# Patient Record
Sex: Female | Born: 1985 | Race: White | Hispanic: No | Marital: Single | Smoking: Never smoker
Health system: Southern US, Community
[De-identification: ages and names within clinical notes are randomized; demographics above are authoritative.]

## PROBLEM LIST (undated history)

## (undated) DIAGNOSIS — F419 Anxiety disorder, unspecified: Secondary | ICD-10-CM

## (undated) DIAGNOSIS — F32A Depression, unspecified: Secondary | ICD-10-CM

## (undated) DIAGNOSIS — G8929 Other chronic pain: Secondary | ICD-10-CM

## (undated) DIAGNOSIS — M797 Fibromyalgia: Secondary | ICD-10-CM

## (undated) DIAGNOSIS — R51 Headache: Secondary | ICD-10-CM

## (undated) DIAGNOSIS — F329 Major depressive disorder, single episode, unspecified: Secondary | ICD-10-CM

## (undated) DIAGNOSIS — Z8619 Personal history of other infectious and parasitic diseases: Secondary | ICD-10-CM

## (undated) DIAGNOSIS — R200 Anesthesia of skin: Secondary | ICD-10-CM

## (undated) DIAGNOSIS — G43909 Migraine, unspecified, not intractable, without status migrainosus: Secondary | ICD-10-CM

## (undated) DIAGNOSIS — R519 Headache, unspecified: Secondary | ICD-10-CM

## (undated) DIAGNOSIS — R202 Paresthesia of skin: Secondary | ICD-10-CM

## (undated) DIAGNOSIS — K603 Anal fistula: Secondary | ICD-10-CM

## (undated) DIAGNOSIS — K219 Gastro-esophageal reflux disease without esophagitis: Secondary | ICD-10-CM

## (undated) DIAGNOSIS — F411 Generalized anxiety disorder: Secondary | ICD-10-CM

## (undated) HISTORY — PX: TUBAL LIGATION: SHX77

## (undated) HISTORY — PX: FISTULOTOMY: SHX6413

## (undated) HISTORY — PX: INCISION AND DRAINAGE ABSCESS ANAL: SUR669

## (undated) HISTORY — DX: Fibromyalgia: M79.7

## (undated) HISTORY — DX: Headache, unspecified: R51.9

## (undated) HISTORY — DX: Morbid (severe) obesity due to excess calories: E66.01

## (undated) HISTORY — DX: Headache: R51

## (undated) HISTORY — DX: Other chronic pain: G89.29

---

## 2007-12-02 ENCOUNTER — Emergency Department: Payer: Self-pay | Admitting: Emergency Medicine

## 2008-07-18 ENCOUNTER — Emergency Department: Payer: Self-pay | Admitting: Emergency Medicine

## 2008-09-22 ENCOUNTER — Emergency Department: Payer: Self-pay | Admitting: Internal Medicine

## 2008-12-11 ENCOUNTER — Emergency Department: Payer: Self-pay | Admitting: Emergency Medicine

## 2009-10-12 ENCOUNTER — Ambulatory Visit: Payer: Self-pay

## 2011-07-18 ENCOUNTER — Emergency Department (HOSPITAL_COMMUNITY)
Admission: EM | Admit: 2011-07-18 | Discharge: 2011-07-18 | Disposition: A | Discharge status: Home / Self Care | Payer: PRIVATE HEALTH INSURANCE | Attending: Emergency Medicine | Admitting: Emergency Medicine

## 2011-07-18 ENCOUNTER — Encounter (HOSPITAL_COMMUNITY): Payer: Self-pay

## 2011-07-18 ENCOUNTER — Emergency Department (HOSPITAL_COMMUNITY): Payer: PRIVATE HEALTH INSURANCE

## 2011-07-18 DIAGNOSIS — R062 Wheezing: Secondary | ICD-10-CM | POA: Insufficient documentation

## 2011-07-18 DIAGNOSIS — R000 Tachycardia, unspecified: Secondary | ICD-10-CM | POA: Insufficient documentation

## 2011-07-18 DIAGNOSIS — R0602 Shortness of breath: Secondary | ICD-10-CM | POA: Insufficient documentation

## 2011-07-18 DIAGNOSIS — R079 Chest pain, unspecified: Secondary | ICD-10-CM | POA: Insufficient documentation

## 2011-07-18 DIAGNOSIS — J069 Acute upper respiratory infection, unspecified: Secondary | ICD-10-CM | POA: Insufficient documentation

## 2011-07-18 LAB — CBC
Hemoglobin: 13.8 g/dL (ref 12.0–15.0)
MCH: 29.5 pg (ref 26.0–34.0)
MCV: 83.8 fL (ref 78.0–100.0)
RBC: 4.68 MIL/uL (ref 3.87–5.11)
WBC: 9.2 10*3/uL (ref 4.0–10.5)

## 2011-07-18 LAB — POCT I-STAT, CHEM 8
BUN: 10 mg/dL (ref 6–23)
Creatinine, Ser: 0.6 mg/dL (ref 0.50–1.10)
Glucose, Bld: 114 mg/dL — ABNORMAL HIGH (ref 70–99)
Hemoglobin: 13.6 g/dL (ref 12.0–15.0)
Potassium: 4 mEq/L (ref 3.5–5.1)
Sodium: 141 mEq/L (ref 135–145)
TCO2: 22 mmol/L (ref 0–100)

## 2011-07-18 LAB — DIFFERENTIAL
Eosinophils Absolute: 0.6 10*3/uL (ref 0.0–0.7)
Eosinophils Relative: 6 % — ABNORMAL HIGH (ref 0–5)
Lymphocytes Relative: 31 % (ref 12–46)
Lymphs Abs: 2.9 10*3/uL (ref 0.7–4.0)
Monocytes Relative: 8 % (ref 3–12)

## 2011-07-18 MED ORDER — PREDNISONE 20 MG PO TABS
60.0000 mg | ORAL_TABLET | Freq: Once | ORAL | Status: AC
  Administered 2011-07-18: 60 mg via ORAL
  Filled 2011-07-18: qty 3

## 2011-07-18 MED ORDER — AEROCHAMBER Z-STAT PLUS/MEDIUM MISC
1.0000 | Freq: Once | Status: AC
  Administered 2011-07-18: 1

## 2011-07-18 MED ORDER — ALBUTEROL SULFATE HFA 108 (90 BASE) MCG/ACT IN AERS
2.0000 | INHALATION_SPRAY | Freq: Once | RESPIRATORY_TRACT | Status: AC
  Administered 2011-07-18: 2 via RESPIRATORY_TRACT
  Filled 2011-07-18 (×2): qty 6.7

## 2011-07-18 MED ORDER — PREDNISONE 20 MG PO TABS: 60.0000 mg | ORAL_TABLET | Freq: Every day | ORAL | Status: AC

## 2011-07-18 MED ORDER — ALBUTEROL (5 MG/ML) CONTINUOUS INHALATION SOLN
10.0000 mg/h | INHALATION_SOLUTION | Freq: Once | RESPIRATORY_TRACT | Status: AC
  Administered 2011-07-18: 10 mg/h via RESPIRATORY_TRACT
  Filled 2011-07-18 (×2): qty 20

## 2011-07-18 MED ORDER — GI COCKTAIL ~~LOC~~
30.0000 mL | Freq: Once | ORAL | Status: AC
  Administered 2011-07-18: 30 mL via ORAL
  Filled 2011-07-18: qty 30

## 2011-07-18 MED ORDER — ALBUTEROL SULFATE (5 MG/ML) 0.5% IN NEBU
5.0000 mg | INHALATION_SOLUTION | Freq: Once | RESPIRATORY_TRACT | Status: AC
  Administered 2011-07-18: 5 mg via RESPIRATORY_TRACT
  Filled 2011-07-18: qty 1

## 2011-07-18 NOTE — ED Notes (Signed)
Prescription given with discharge instructions.  

## 2011-07-18 NOTE — ED Notes (Signed)
Patient transported to X-ray 

## 2011-07-18 NOTE — ED Notes (Signed)
Pt complains of sob, onset last pm, hx of allergies but never have been this bad she sts.

## 2011-07-18 NOTE — ED Provider Notes (Signed)
History     CSN: [id number]  Arrival date & time 07/18/11  0501  6:17 AM Pt reports SOB that began about 11p, last night. Associated with burning in chest, wheezing, and coughing. Reports a h/o seasonal allergies. States yesterday she thought her allergies were acting up. Denies asthma, n/v, abdominal pain, fevers, sore throat, congestion. No OCP, smoking, no blood clots, no travel. Reports moving into a new house a few weeks ago and has noticed mild ankle pain/ swelling for last few weeks Patient is a 26 y.o. female presenting with shortness of breath. The history is provided by the patient.  Shortness of Breath  The current episode started yesterday. The onset was sudden. The problem occurs continuously. The problem has been unchanged. The problem is moderate. The symptoms are relieved by nothing. Associated symptoms include chest pain, cough, shortness of breath and wheezing. Pertinent negatives include no fever, no rhinorrhea and no sore throat. She was not exposed to toxic fumes. She has not inhaled smoke recently. She has had no prior steroid use. She has had no prior hospitalizations. She has had no prior ICU admissions. She has had no prior intubations. Her past medical history does not include asthma, past wheezing or eczema. She has been behaving normally.    History reviewed. No pertinent past medical history.  History reviewed. No pertinent past surgical history.  History reviewed. No pertinent family history.  History  Substance Use Topics  . Smoking status: Never Smoker   . Smokeless tobacco: Not on file  . Alcohol Use: No    OB History    Grav Para Term Preterm Abortions TAB SAB Ect Mult Living                  Review of Systems  Constitutional: Negative for fever, diaphoresis and fatigue.  HENT: Negative for congestion, sore throat, rhinorrhea and neck pain.   Eyes: Positive for itching.  Respiratory: Positive for cough, shortness of breath and wheezing.     Cardiovascular: Positive for chest pain. Negative for leg swelling.  Gastrointestinal: Negative for nausea, vomiting and abdominal pain.  Musculoskeletal: Negative for myalgias.  Neurological: Negative for dizziness, weakness, numbness and headaches.  All other systems reviewed and are negative.    Allergies  Review of patient's allergies indicates no known allergies.  Home Medications  No current outpatient prescriptions on file.  BP 118/69  Pulse 105  Temp(Src) 98.2 F (36.8 C) (Oral)  Resp 16  SpO2 97%  Physical Exam  Vitals reviewed. Constitutional: She is oriented to person, place, and time. Vital signs are normal. She appears well-developed and well-nourished. No distress.  HENT:  Head: Normocephalic and atraumatic.  Right Ear: External ear normal.  Left Ear: External ear normal.  Nose: Nose normal.  Mouth/Throat: Oropharynx is clear and moist. No oropharyngeal exudate.  Eyes: Conjunctivae are normal. Pupils are equal, round, and reactive to light.  Neck: Normal range of motion. Neck supple.  Cardiovascular: Regular rhythm and normal heart sounds.  Tachycardia present.  Exam reveals no friction rub.   No murmur heard. Pulmonary/Chest: Effort normal. She has wheezes (upper respiratory wheeze that clears with coughing). She has no rhonchi. She has no rales. She exhibits no tenderness.  Abdominal: Soft. Bowel sounds are normal. She exhibits no distension. There is no tenderness.  Musculoskeletal: Normal range of motion.  Lymphadenopathy:    She has no cervical adenopathy.  Neurological: She is alert and oriented to person, place, and time. Coordination normal.  Skin: Skin  is warm and dry. No rash noted. No erythema. No pallor.    ED Course  Procedures  Results for orders placed during the hospital encounter of 07/18/11  D-DIMER, QUANTITATIVE      Component Value Range   D-Dimer, Quant <0.22  0.00 - 0.48 (ug/mL-FEU)  CBC      Component Value Range   WBC 9.2  4.0  - 10.5 (K/uL)   RBC 4.68  3.87 - 5.11 (MIL/uL)   Hemoglobin 13.8  12.0 - 15.0 (g/dL)   HCT 16.1  09.6 - 04.5 (%)   MCV 83.8  78.0 - 100.0 (fL)   MCH 29.5  26.0 - 34.0 (pg)   MCHC 35.2  30.0 - 36.0 (g/dL)   RDW 40.9  81.1 - 91.4 (%)   Platelets 304  150 - 400 (K/uL)  DIFFERENTIAL      Component Value Range   Neutrophils Relative 54  43 - 77 (%)   Neutro Abs 5.0  1.7 - 7.7 (K/uL)   Lymphocytes Relative 31  12 - 46 (%)   Lymphs Abs 2.9  0.7 - 4.0 (K/uL)   Monocytes Relative 8  3 - 12 (%)   Monocytes Absolute 0.7  0.1 - 1.0 (K/uL)   Eosinophils Relative 6 (*) 0 - 5 (%)   Eosinophils Absolute 0.6  0.0 - 0.7 (K/uL)   Basophils Relative 0  0 - 1 (%)   Basophils Absolute 0.0  0.0 - 0.1 (K/uL)  POCT I-STAT, CHEM 8      Component Value Range   Sodium 141  135 - 145 (mEq/L)   Potassium 4.0  3.5 - 5.1 (mEq/L)   Chloride 108  96 - 112 (mEq/L)   BUN 10  6 - 23 (mg/dL)   Creatinine, Ser 7.82  0.50 - 1.10 (mg/dL)   Glucose, Bld 956 (*) 70 - 99 (mg/dL)   Calcium, Ion 2.13  0.86 - 1.32 (mmol/L)   TCO2 22  0 - 100 (mmol/L)   Hemoglobin 13.6  12.0 - 15.0 (g/dL)   HCT 57.8  46.9 - 62.9 (%)  POCT PREGNANCY, URINE      Component Value Range   Preg Test, Ur NEGATIVE  NEGATIVE    Dg Chest 2 View  07/18/2011  *RADIOLOGY REPORT*  Clinical Data: Shortness of breath, cough.  CHEST - 2 VIEW  Comparison: None.  Findings: Heart and mediastinal contours are within normal limits. No focal opacities or effusions.  No acute bony abnormality.  IMPRESSION: No active cardiopulmonary disease.  Original Report Authenticated By: Cyndie Chime, M.D.    MDM  9:42 AM  Patient reports feeling better. Suspect URi. Will d/c with albuterol and prednisone. Patient agrees with plan and is ready for d/c        Thomasene Lot, PA-C 07/18/11 5284

## 2011-07-18 NOTE — ED Notes (Signed)
Respiratory coming to administer treatment.

## 2011-07-18 NOTE — Discharge Instructions (Signed)
Please use inhaler 1-2 puffs every 4-6 hours as needed. Be sure to take all prednisone as prescribed.   Antibiotic Nonuse  Your caregiver felt that the infection or problem was not one that would be helped with an antibiotic. Infections may be caused by viruses or bacteria. Only a caregiver can tell which one of these is the likely cause of an illness. A cold is the most common cause of infection in both adults and children. A cold is a virus. Antibiotic treatment will have no effect on a viral infection. Viruses can lead to many lost days of work caring for sick children and many missed days of school. Children may catch as many as 10 "colds" or "flus" per year during which they can be tearful, cranky, and uncomfortable. The goal of treating a virus is aimed at keeping the ill person comfortable. Antibiotics are medications used to help the body fight bacterial infections. There are relatively few types of bacteria that cause infections but there are hundreds of viruses. While both viruses and bacteria cause infection they are very different types of germs. A viral infection will typically go away by itself within 7 to 10 days. Bacterial infections may spread or get worse without antibiotic treatment. Examples of bacterial infections are:  Sore throats (like strep throat or tonsillitis).   Infection in the lung (pneumonia).   Ear and skin infections.  Examples of viral infections are:  Colds or flus.   Most coughs and bronchitis.   Sore throats not caused by Strep.   Runny noses.  It is often best not to take an antibiotic when a viral infection is the cause of the problem. Antibiotics can kill off the helpful bacteria that we have inside our body and allow harmful bacteria to start growing. Antibiotics can cause side effects such as allergies, nausea, and diarrhea without helping to improve the symptoms of the viral infection. Additionally, repeated uses of antibiotics can cause bacteria  inside of our body to become resistant. That resistance can be passed onto harmful bacterial. The next time you have an infection it may be harder to treat if antibiotics are used when they are not needed. Not treating with antibiotics allows our own immune system to develop and take care of infections more efficiently. Also, antibiotics will work better for [place] when they are prescribed for bacterial infections. Treatments for a child that is ill may include:  Give extra fluids throughout the day to stay hydrated.   Get plenty of rest.   Only give your child over-the-counter or prescription medicines for pain, discomfort, or fever as directed by your caregiver.   The use of a cool mist humidifier may help stuffy noses.   Cold medications if suggested by your caregiver.  Your caregiver may decide to start you on an antibiotic if:  The problem you were seen for today continues for a longer length of time than expected.   You develop a secondary bacterial infection.  SEEK MEDICAL CARE IF:  Fever lasts longer than 5 days.   Symptoms continue to get worse after 5 to 7 days or become severe.   Difficulty in breathing develops.   Signs of dehydration develop (poor drinking, rare urinating, dark colored urine).   Changes in behavior or worsening tiredness (listlessness or lethargy).  Document Released: 05/29/2001 Document Revised: 03/09/2011 Document Reviewed: 11/25/2008 [facility] Patient Information 2012 [place], [place].  Upper Respiratory Infection, Adult An upper respiratory infection (URI) is also sometimes known as the common  cold. The upper respiratory tract includes the nose, sinuses, throat, trachea, and bronchi. Bronchi are the airways leading to the lungs. Most people improve within 1 week, but symptoms can last up to 2 weeks. A residual cough may last even longer.  CAUSES Many different viruses can infect the tissues lining the upper respiratory tract. The tissues become irritated  and inflamed and often become very moist. Mucus production is also common. A cold is contagious. You can easily spread the virus to others by oral contact. This includes kissing, sharing a glass, coughing, or sneezing. Touching your mouth or nose and then touching a surface, which is then touched by another person, can also spread the virus. SYMPTOMS  Symptoms typically develop 1 to 3 days after you come in contact with a cold virus. Symptoms vary from person to person. They may include:  Runny nose.   Sneezing.   Nasal congestion.   Sinus irritation.   Sore throat.   Loss of voice (laryngitis).   Cough.   Fatigue.   Muscle aches.   Loss of appetite.   Headache.   Low-grade fever.  DIAGNOSIS  You might diagnose your own cold based on familiar symptoms, since most people get a cold 2 to 3 times a year. Your caregiver can confirm this based on your exam. Most importantly, your caregiver can check that your symptoms are not due to another disease such as strep throat, sinusitis, pneumonia, asthma, or epiglottitis. Blood tests, throat tests, and X-rays are not necessary to diagnose a common cold, but they may sometimes be helpful in excluding other more serious diseases. Your caregiver will decide if any further tests are required. RISKS AND COMPLICATIONS  You may be at risk for a more severe case of the common cold if you smoke cigarettes, have chronic heart disease (such as heart failure) or lung disease (such as asthma), or if you have a weakened immune system. The very young and very old are also at risk for more serious infections. Bacterial sinusitis, middle ear infections, and bacterial pneumonia can complicate the common cold. The common cold can worsen asthma and chronic obstructive pulmonary disease (COPD). Sometimes, these complications can require emergency medical care and may be life-threatening. PREVENTION  The best way to protect against getting a cold is to practice good  hygiene. Avoid oral or hand contact with people with cold symptoms. Wash your hands often if contact occurs. There is no clear evidence that vitamin C, vitamin E, echinacea, or exercise reduces the chance of developing a cold. However, it is always recommended to get plenty of rest and practice good nutrition. TREATMENT  Treatment is directed at relieving symptoms. There is no cure. Antibiotics are not effective, because the infection is caused by a virus, not by bacteria. Treatment may include:  Increased fluid intake. Sports drinks offer valuable electrolytes, sugars, and fluids.   Breathing heated mist or steam (vaporizer or shower).   Eating chicken soup or other clear broths, and maintaining good nutrition.   Getting plenty of rest.   Using gargles or lozenges for comfort.   Controlling fevers with ibuprofen or acetaminophen as directed by your caregiver.   Increasing usage of your inhaler if you have asthma.  Zinc gel and zinc lozenges, taken in the first 24 hours of the common cold, can shorten the duration and lessen the severity of symptoms. Pain medicines may help with fever, muscle aches, and throat pain. A variety of non-prescription medicines are available to treat congestion and  runny nose. Your caregiver can make recommendations and may suggest nasal or lung inhalers for other symptoms.  HOME CARE INSTRUCTIONS   Only take over-the-counter or prescription medicines for pain, discomfort, or fever as directed by your caregiver.   Use a warm mist humidifier or inhale steam from a shower to increase air moisture. This may keep secretions moist and make it easier to breathe.   Drink enough water and fluids to keep your urine clear or pale yellow.   Rest as needed.   Return to work when your temperature has returned to normal or as your caregiver advises. You may need to stay home longer to avoid infecting others. You can also use a face mask and careful hand washing to prevent  spread of the virus.  SEEK MEDICAL CARE IF:   After the first few days, you feel you are getting worse rather than better.   You need your caregiver's advice about medicines to control symptoms.   You develop chills, worsening shortness of breath, or brown or red sputum. These may be signs of pneumonia.   You develop yellow or brown nasal discharge or pain in the face, especially when you bend forward. These may be signs of sinusitis.   You develop a fever, swollen neck glands, pain with swallowing, or white areas in the back of your throat. These may be signs of strep throat.  SEEK IMMEDIATE MEDICAL CARE IF:   You have a fever.   You develop severe or persistent headache, ear pain, sinus pain, or chest pain.   You develop wheezing, a prolonged cough, cough up blood, or have a change in your usual mucus (if you have chronic lung disease).   You develop sore muscles or a stiff neck.  Document Released: 09/13/2000 Document Revised: 03/09/2011 Document Reviewed: 07/22/2010 [facility] Patient Information 2012 [place], [place].

## 2011-07-18 NOTE — ED Notes (Signed)
First meeting with patient. Patient resting with NAD at this time. Family at bedside.

## 2011-07-19 NOTE — ED Provider Notes (Signed)
Medical screening examination/treatment/procedure(s) were performed by non-physician practitioner and as supervising physician I was immediately available for consultation/collaboration.  Olivia Mackie, MD 07/19/11 [phone]

## 2012-03-04 ENCOUNTER — Emergency Department (HOSPITAL_COMMUNITY): Payer: PRIVATE HEALTH INSURANCE

## 2012-03-04 ENCOUNTER — Emergency Department (HOSPITAL_COMMUNITY)
Admission: EM | Admit: 2012-03-04 | Discharge: 2012-03-04 | Disposition: A | Discharge status: Home / Self Care | Payer: PRIVATE HEALTH INSURANCE | Attending: Emergency Medicine | Admitting: Emergency Medicine

## 2012-03-04 ENCOUNTER — Encounter (HOSPITAL_COMMUNITY): Payer: Self-pay | Admitting: Emergency Medicine

## 2012-03-04 DIAGNOSIS — Y939 Activity, unspecified: Secondary | ICD-10-CM | POA: Insufficient documentation

## 2012-03-04 DIAGNOSIS — M25571 Pain in right ankle and joints of right foot: Secondary | ICD-10-CM

## 2012-03-04 DIAGNOSIS — IMO0002 Reserved for concepts with insufficient information to code with codable children: Secondary | ICD-10-CM | POA: Insufficient documentation

## 2012-03-04 DIAGNOSIS — Y929 Unspecified place or not applicable: Secondary | ICD-10-CM | POA: Insufficient documentation

## 2012-03-04 DIAGNOSIS — S8990XA Unspecified injury of unspecified lower leg, initial encounter: Secondary | ICD-10-CM | POA: Insufficient documentation

## 2012-03-04 DIAGNOSIS — M25579 Pain in unspecified ankle and joints of unspecified foot: Secondary | ICD-10-CM | POA: Insufficient documentation

## 2012-03-04 DIAGNOSIS — S99919A Unspecified injury of unspecified ankle, initial encounter: Secondary | ICD-10-CM

## 2012-03-04 MED ORDER — OXYCODONE-ACETAMINOPHEN 5-325 MG PO TABS: 1.0000 | ORAL_TABLET | ORAL | Status: DC | PRN

## 2012-03-04 MED ORDER — NAPROXEN 500 MG PO TABS: 500.0000 mg | ORAL_TABLET | Freq: Two times a day (BID) | ORAL | Status: DC

## 2012-03-04 MED ORDER — HYDROCODONE-ACETAMINOPHEN 5-325 MG PO TABS: 2.0000 | ORAL_TABLET | ORAL | Status: DC | PRN

## 2012-03-04 MED ORDER — OXYCODONE-ACETAMINOPHEN 5-325 MG PO TABS
2.0000 | ORAL_TABLET | Freq: Once | ORAL | Status: AC
  Administered 2012-03-04: 2 via ORAL
  Filled 2012-03-04: qty 2

## 2012-03-04 NOTE — ED Provider Notes (Signed)
Medical screening examination/treatment/procedure(s) were performed by non-physician practitioner and as supervising physician I was immediately available for consultation/collaboration  Taha Dimond, MD 03/04/12 1803 

## 2012-03-04 NOTE — ED Provider Notes (Signed)
[name] [name] is a 26 y.o. female with 4 mos of foot and ankle pain and associated limping.  Pt care assumed from [place], [place].  Pain radiated from foot into leg and on exam she is tender on the posterior tibial and bilateral malleoli.  Plan: if x-ray is negative, conservative treatment and ortho f/u.    Pt c/o pain, Percocet given.    X-ray with soft tissue swelling but without evidence of acute fracture or dislocation.  I discussed these findings with the patient, recommended conservative therapy including an ASO ankle brace and f/u with ortho for evaluation of the tendons and ligaments in the ankle.  I have also discussed reasons to return immediately to the ER.  Patient expresses understanding and agrees with plan.  1. Medications: norco, naprosyn, usual home medications 2. Treatment: rest, drink plenty of fluids, take medications as prescribed, RICE protocol 3. Follow Up: Please followup with your primary doctor for discussion of your diagnoses and further evaluation after today's visit; if you do not have a primary care doctor use the resource guide provided to find one; also f/u with orthopedics as listed   Dierdre Forth, PA-C 03/04/12 1725

## 2012-03-04 NOTE — ED Notes (Signed)
Pt presenting to ed with c/o right ankle pain x 4 months pt states she got her ankle caught in a tire and she continues to have pain. Pt states pain isn't getting any better. Pt states she is able to ambulate but she has pain.

## 2012-03-04 NOTE — ED Provider Notes (Signed)
History     CSN: [id number]  Arrival date & time 03/04/12  1300   First MD Initiated Contact with Patient 03/04/12 1403      Chief Complaint  Patient presents with  . Ankle Pain    (Consider location/radiation/quality/duration/timing/severity/associated sxs/prior treatment) HPI Comments: 26 year old female presents to the ED with chief complaint of right foot knee and hip pain.  Patient states that in August of this year she stepped in a tire and had what she thought was a ankle sprain.  She self treated with ankle brace and RICE.  She states that her symptoms got better after about 6 weeks, however she has had continued pain in the left ankle with limping and decreased in ability to perform her ADLs.  She states that over the past few days she's had increasing pain in her right foot and describes severe pain with weightbearing.  Pain is worst in her Achilles tendon and bilaterally posterior to the malleoli.  She also has pain across the dorsal surface of the foot.  She denies recent injury.  She has associated pain in the knee and hip but denies injury to the joints as well. SHE HAS INTERMITTENT SHOOTING PAIN FROM THE FOOT THAT RADIATES UP THE SHIN. Denies paresthesias   Denies fevers, chills, myalgias, arthralgias. Denies DOE, SOB, chest tightness or pressure, radiation to left arm, jaw or back, or diaphoresis. Denies dysuria, flank pain, suprapubic pain, frequency, urgency, or hematuria. Denies headaches, light headedness, weakness, visual disturbances. Denies abdominal pain, nausea, vomiting, diarrhea or constipation.    Patient is a 26 y.o. female presenting with ankle pain. The history is provided by the patient. No language interpreter was used.  Ankle Pain     History reviewed. No pertinent past medical history.  History reviewed. No pertinent past surgical history.  No family history on file.  History  Substance Use Topics  . Smoking status: Never Smoker   . Smokeless  tobacco: Not on file  . Alcohol Use: No    OB History    Grav Para Term Preterm Abortions TAB SAB Ect Mult Living                  Review of Systems Ten systems are reviewed and are negative for acute change except as noted in the HPI  Allergies  Review of patient's allergies indicates no known allergies.  Home Medications  No current outpatient prescriptions on file.  BP 121/55  Pulse 87  Temp 98.5 F (36.9 C) (Oral)  Resp 18  SpO2 100%  LMP 01/29/2012  Physical Exam Physical Exam  Nursing note and vitals reviewed. Constitutional: She is oriented to person, place, and time. She appears well-developed and well-nourished. No distress.  HENT:  Head: Normocephalic and atraumatic.  Eyes: Conjunctivae normal and EOM are normal. Pupils are equal, round, and reactive to light. No scleral icterus.  Neck: Normal range of motion.  Cardiovascular: Normal rate, regular rhythm and normal heart sounds.  Exam reveals no gallop and no friction rub.   No murmur heard. Pulmonary/Chest: Effort normal and breath sounds normal. No respiratory distress.  Abdominal: Soft. Bowel sounds are normal. She exhibits no distension and no mass. There is no tenderness. There is no guarding.  Musculoskeletal: right ankle without bruising, swelling or deformity. TTP post to BL malleoli and long deltoid ligament. Also TTP in achilles.  Strength 5/5 NV intact. Knee and hip exam WNL Neurological: She is alert and oriented to person, place, and time.  Skin:  Skin is warm and dry. She is not diaphoretic.    ED Course  Procedures (including critical care time)  Labs Reviewed - No data to display No results found.   No diagnosis found.    MDM  Patient with ankle pain s/p injury > 5 months ago.  I doubt acute abnormality. [name] has tendonitis and knee hip pain form associated gait limp   She Denies back pain or history of back problems.   I have given report to PA Muthersbaugh who will Dispo  the patient when xray returns. BP 121/55  Pulse 87  Temp 98.5 F (36.9 C) (Oral)  Resp 18  SpO2 100%  LMP 01/29/2012        Arthor Captain, PA-C 03/04/12 1611

## 2012-03-06 NOTE — ED Provider Notes (Signed)
Medical screening examination/treatment/procedure(s) were performed by non-physician practitioner and as supervising physician I was immediately available for consultation/collaboration.   Tyrea Froberg W Chimaobi Casebolt, MD 03/06/12 0016 

## 2013-04-03 NOTE — L&D Delivery Note (Signed)
Delivery Note At 1:48 AM a viable female was delivered via Vaginal, Spontaneous Delivery (Presentation: ;  ).  APGAR: 8, 9; weight .   Placenta status: Intact, Spontaneous.  Cord: 3 vessels with the following complications: None.  Cord pH: not done  Anesthesia: Epidural  Episiotomy:  Lacerations:  Suture Repair: 2.0 vicryl Est. Blood Loss (mL):   Mom to postpartum.  Baby to Couplet care / Skin to Skin.  Frederico Hamman 08/16/2013, 1:59 AM

## 2013-05-09 ENCOUNTER — Ambulatory Visit: Payer: PRIVATE HEALTH INSURANCE | Admitting: Family Medicine

## 2013-05-22 ENCOUNTER — Telehealth: Payer: Self-pay

## 2013-05-22 NOTE — Telephone Encounter (Signed)
Number rings then switches to busy

## 2013-05-23 ENCOUNTER — Encounter: Payer: Self-pay | Admitting: Internal Medicine

## 2013-05-23 ENCOUNTER — Ambulatory Visit (INDEPENDENT_AMBULATORY_CARE_PROVIDER_SITE_OTHER): Payer: PRIVATE HEALTH INSURANCE | Admitting: Internal Medicine

## 2013-05-23 VITALS — BP 123/77 | HR 94 | Temp 97.9°F | Ht 63.2 in | Wt 207.0 lb

## 2013-05-23 DIAGNOSIS — Z0000 Encounter for general adult medical examination without abnormal findings: Secondary | ICD-10-CM

## 2013-05-23 DIAGNOSIS — R51 Headache: Secondary | ICD-10-CM | POA: Insufficient documentation

## 2013-05-23 DIAGNOSIS — N926 Irregular menstruation, unspecified: Secondary | ICD-10-CM

## 2013-05-23 DIAGNOSIS — R519 Headache, unspecified: Secondary | ICD-10-CM | POA: Insufficient documentation

## 2013-05-23 DIAGNOSIS — R87619 Unspecified abnormal cytological findings in specimens from cervix uteri: Secondary | ICD-10-CM | POA: Insufficient documentation

## 2013-05-23 LAB — COMPREHENSIVE METABOLIC PANEL
ALT: 10 U/L (ref 0–35)
AST: 10 U/L (ref 0–37)
Albumin: 3.1 g/dL — ABNORMAL LOW (ref 3.5–5.2)
Alkaline Phosphatase: 52 U/L (ref 39–117)
BILIRUBIN TOTAL: 0.3 mg/dL (ref 0.3–1.2)
BUN: 5 mg/dL — ABNORMAL LOW (ref 6–23)
CALCIUM: 8.6 mg/dL (ref 8.4–10.5)
CHLORIDE: 104 meq/L (ref 96–112)
CO2: 24 mEq/L (ref 19–32)
CREATININE: 0.5 mg/dL (ref 0.4–1.2)
GFR: 158.79 mL/min (ref >60.00)
Glucose, Bld: 82 mg/dL (ref 70–99)
Potassium: 3.4 mEq/L — ABNORMAL LOW (ref 3.5–5.1)
Sodium: 134 mEq/L — ABNORMAL LOW (ref 135–145)
Total Protein: 6.7 g/dL (ref 6.0–8.3)

## 2013-05-23 LAB — TSH: TSH: 1.07 u[IU]/mL (ref 0.35–5.50)

## 2013-05-23 LAB — CBC WITH DIFFERENTIAL/PLATELET
BASOS ABS: 0 10*3/uL (ref 0.0–0.1)
Basophils Relative: 0.4 % (ref 0.0–3.0)
EOS ABS: 0.1 10*3/uL (ref 0.0–0.7)
Eosinophils Relative: 0.9 % (ref 0.0–5.0)
HEMATOCRIT: 37.8 % (ref 36.0–46.0)
HEMOGLOBIN: 12.7 g/dL (ref 12.0–15.0)
LYMPHS ABS: 2.6 10*3/uL (ref 0.7–4.0)
Lymphocytes Relative: 24.3 % (ref 12.0–46.0)
MCHC: 33.7 g/dL (ref 30.0–36.0)
MCV: 89.6 fl (ref 78.0–100.0)
MONO ABS: 0.7 10*3/uL (ref 0.1–1.0)
Monocytes Relative: 6.2 % (ref 3.0–12.0)
NEUTROS ABS: 7.2 10*3/uL (ref 1.4–7.7)
Neutrophils Relative %: 68.2 % (ref 43.0–77.0)
Platelets: 315 10*3/uL (ref 150.0–400.0)
RBC: 4.21 Mil/uL (ref 3.87–5.11)
RDW: 12.7 % (ref 11.5–14.6)
WBC: 10.6 10*3/uL — ABNORMAL HIGH (ref 4.5–10.5)

## 2013-05-23 LAB — LIPID PANEL
Cholesterol: 204 mg/dL — ABNORMAL HIGH (ref 0–200)
HDL: 57.7 mg/dL (ref >39.00)
Total CHOL/HDL Ratio: 4
Triglycerides: 153 mg/dL — ABNORMAL HIGH (ref 0.0–149.0)
VLDL: 30.6 mg/dL (ref 0.0–40.0)

## 2013-05-23 LAB — LDL CHOLESTEROL, DIRECT: LDL DIRECT: 127.3 mg/dL

## 2013-05-23 LAB — T4, FREE: FREE T4: 0.59 ng/dL — AB (ref 0.60–1.60)

## 2013-05-23 LAB — T3, FREE: T3 FREE: 2.6 pg/mL (ref 2.3–4.2)

## 2013-05-23 MED ORDER — TOPIRAMATE 50 MG PO TABS: 50.0000 mg | ORAL_TABLET | Freq: Two times a day (BID) | ORAL | Status: DC

## 2013-05-23 NOTE — Assessment & Plan Note (Addendum)
She describes 2 types of headaches, one is consistent with tension headaches the other with migraine or cluster headaches. Never had brain imaging. Neurological exam normal. Plan: Trial with Topamax, patient aware she cannot take that if pregnant Refer to neurology for further eval, likely she will need an MRI.

## 2013-05-23 NOTE — Telephone Encounter (Signed)
Unable to reach pre visit.  

## 2013-05-23 NOTE — Progress Notes (Signed)
   Subjective:    Patient ID: [name] [name], female    DOB: 02-12-1986, 28 y.o.   MRN: [id number]  DOS:  05/23/2013 New pt, CPX, we also discussed other issues, see below History of headaches, worse for the last few months. She has 2 type of headaches -- one that is debilitating, she is unable to work, Located at the right side of the head, described as pain and numbness, associated with tearing and  her vision gets funny. Usually if she takes something OTC immediately it may shorten the duration of her symptoms. -the other headache is mild, may last several hours, she think is related to tension. Not associated with nausea- vomiting. She has occasionally allergy symptoms but headaches are usually not related to allergies Also, periods are irregular, see assessment and plan.   ROS Diet-- try to eat healthy Exercise-- gym x 2/week No  CP, SOB. No palpitations Denies   diarrhea No abdominal pain Denies  blood in the stools (-) cough, sputum production (-) wheezing, chest congestion No dysuria, gross hematuria, difficulty urinating  No anxiety, depression   Past Medical History  Diagnosis Date  . Abnormal Pap smear of cervix   . Irregular periods   . Allergic rhinitis     Past Surgical History  Procedure Laterality Date  . No past surgeries      History   Social History  . Marital Status: Single    Spouse Name: N/A    Number of Children: 0  . Years of Education: N/A   Occupational History  . accounting clerk     Social History Main Topics  . Smoking status: Never Smoker   . Smokeless tobacco: Never Used  . Alcohol Use: Yes     Comment: socially   . Drug Use: No  . Sexual Activity: Not on file   Other Topics Concern  . Not on file   Social History Narrative   G0   Live w/ boyfriend        Medication List       This list is accurate as of: 05/23/13 11:59 PM.  Always use your most recent med list.               topiramate 50 MG tablet  Commonly known  as:  TOPAMAX  Take 1 tablet (50 mg total) by mouth 2 (two) times daily.           Objective:   Physical Exam BP 123/77  Pulse 94  Temp(Src) 97.9 F (36.6 C)  Ht 5' 3.2" (1.605 m)  Wt 207 lb (93.895 kg)  BMI 36.45 kg/m2  SpO2 98% General -- alert, well-developed, NAD.  HEENT-- Face symmetric, sinuses nontender to palpation, nose clear Neck --no thyromegaly Lungs -- normal respiratory effort, no intercostal retractions, no accessory muscle use, and normal breath sounds.  Heart-- normal rate, regular rhythm, no murmur.  Abdomen-- Not distended, good bowel sounds,soft, non-tender.  Extremities-- no pretibial edema bilaterally  Neurologic--  alert & oriented X3. Speech normal, gait normal, strength normal in all extremities.  DTRs symmetric  EOMI, PERLA   Psych-- Cognition and judgment appear intact. Cooperative with normal attention span and concentration. No anxious or depressed appearing.      Assessment & Plan:

## 2013-05-23 NOTE — Progress Notes (Signed)
Pre visit review using our clinic review tool, if applicable. No additional management support is needed unless otherwise documented below in the visit note. 

## 2013-05-23 NOTE — Assessment & Plan Note (Addendum)
Td 2008? Never had a cscope  Discussed diet and exercise Labs including a free T3 free T4, at some point she was told her thyroid was slightly normal. She has a long history of abnormal periods, used Depo shots for 3.5 years, stopped last year, periods restarted 3 months later but then they stop. Last menstrual period 12-2012. Also history of abnormal Pap smears. Plan: Refer to gynecology

## 2013-05-23 NOTE — Patient Instructions (Signed)
Get your blood work before you leave   Topamax for migraine prevention: Half tablet every night for 2 weeks half tablet twice a day for 2 weeks 1 tablet twice a day  You can not take this medication if you are or suspect you aren't pregnant  Next visit in 6 months

## 2013-05-24 LAB — PREGNANCY, URINE: Preg Test, Ur: POSITIVE

## 2013-05-25 ENCOUNTER — Telehealth: Payer: Self-pay | Admitting: Internal Medicine

## 2013-05-25 ENCOUNTER — Encounter: Payer: Self-pay | Admitting: Internal Medicine

## 2013-05-25 DIAGNOSIS — N912 Amenorrhea, unspecified: Secondary | ICD-10-CM

## 2013-05-25 NOTE — Telephone Encounter (Signed)
Labs came back with a few abnormalities, most important the UPT came back positive, she has irregular periods, last period was approximately 7 months ago. I called the patient to let her know she needs to avoid  Topamax until we do the serum pregnancy test to confirm the UPT. Unable to leave a message to her in her phone, called twice.

## 2013-05-26 NOTE — Telephone Encounter (Signed)
Spoke with the patient today, she has not started Topamax and she won't until we have the serum pregnancy test which will be drawn this week. She has no symptoms of pregnancy such as nausea. Also discussed all other  results, potassium is a little low,TSH normal, free T4 slt low and all that needs to be rechecked yearly.

## 2013-05-28 ENCOUNTER — Other Ambulatory Visit: Payer: Self-pay | Admitting: Internal Medicine

## 2013-05-28 ENCOUNTER — Other Ambulatory Visit (INDEPENDENT_AMBULATORY_CARE_PROVIDER_SITE_OTHER): Payer: PRIVATE HEALTH INSURANCE

## 2013-05-28 ENCOUNTER — Other Ambulatory Visit: Payer: PRIVATE HEALTH INSURANCE

## 2013-05-28 DIAGNOSIS — N926 Irregular menstruation, unspecified: Secondary | ICD-10-CM

## 2013-05-28 LAB — HCG, QUANTITATIVE, PREGNANCY

## 2013-06-05 ENCOUNTER — Telehealth: Payer: Self-pay

## 2013-06-05 NOTE — Telephone Encounter (Signed)
Patient called and requested a copy of her pregnancy + lab results. She was laid off from her job today and she is applying for Medicaid to cover pregnancy and they need proof.  Requested information printed and placed at front desk for patient.

## 2013-06-26 ENCOUNTER — Encounter: Payer: Self-pay | Admitting: Obstetrics

## 2013-06-26 ENCOUNTER — Ambulatory Visit (INDEPENDENT_AMBULATORY_CARE_PROVIDER_SITE_OTHER): Payer: Medicaid Other | Admitting: Obstetrics

## 2013-06-26 DIAGNOSIS — R51 Headache: Secondary | ICD-10-CM

## 2013-06-26 DIAGNOSIS — A6004 Herpesviral vulvovaginitis: Secondary | ICD-10-CM | POA: Insufficient documentation

## 2013-06-26 DIAGNOSIS — R519 Headache, unspecified: Secondary | ICD-10-CM | POA: Insufficient documentation

## 2013-06-26 DIAGNOSIS — Z3400 Encounter for supervision of normal first pregnancy, unspecified trimester: Secondary | ICD-10-CM

## 2013-06-26 LAB — POCT URINALYSIS DIPSTICK
Bilirubin, UA: NEGATIVE
GLUCOSE UA: NEGATIVE
Ketones, UA: NEGATIVE
Leukocytes, UA: NEGATIVE
Nitrite, UA: NEGATIVE
PH UA: 6.5
RBC UA: NEGATIVE
Spec Grav, UA: 1.015
UROBILINOGEN UA: NEGATIVE

## 2013-06-26 LAB — OB RESULTS CONSOLE GC/CHLAMYDIA
CHLAMYDIA, DNA PROBE: NEGATIVE
GC PROBE AMP, GENITAL: NEGATIVE

## 2013-06-26 MED ORDER — CITRANATAL HARMONY 27-1-250 MG PO CAPS: 1.0000 | ORAL_CAPSULE | Freq: Every day | ORAL | Status: DC

## 2013-06-26 MED ORDER — VALACYCLOVIR HCL 1 G PO TABS: 1000.0000 mg | ORAL_TABLET | Freq: Every day | ORAL | Status: DC

## 2013-06-26 MED ORDER — BUTALBITAL-APAP-CAFFEINE 50-325-40 MG PO TABS: 2.0000 | ORAL_TABLET | Freq: Four times a day (QID) | ORAL | Status: DC | PRN

## 2013-06-26 NOTE — Progress Notes (Signed)
Pulse 109  Subjective:    [name] [name] is being seen today for her first obstetrical visit.  This is not a planned pregnancy. She is at Unknown gestation. Her obstetrical history is significant for none. Relationship with FOB: significant other, living together. Patient unsure intend to breast feed. Pregnancy history fully reviewed.  Pt states that she is unsure of her dates.  Pt states that her cycle have always been irregular.  Pt states that her last cycle was in August.  Pt states that she thinks that she is closer to [redacted] weeks gestation.  Pt is having severe migraines and is needing medication.   Pt has hx of HSV outbreaks.  Pt was diagnosed 4 years ago.  Pt has not had an outbreak over a year ago.  Menstrual History: OB History   Grav Para Term Preterm Abortions TAB SAB Ect Mult Living   1               Menarche age: 49 No LMP recorded. Patient is pregnant.    The following portions of the patient's history were reviewed and updated as appropriate: allergies, current medications, past family history, past medical history, past social history, past surgical history and problem list.  Review of Systems Pertinent items are noted in HPI.    Objective:    General appearance: alert and no distress Abdomen: normal findings: soft, non-tender Pelvic: cervix normal in appearance, external genitalia normal, no adnexal masses or tenderness, no cervical motion tenderness, rectovaginal septum normal, vagina normal without discharge and uterus 27-28 weeks, soft, NT.  FHR 140-150 bpm. Extremities: extremities normal, atraumatic, no cyanosis or edema    Assessment:    Pregnancy at Unknown weeks    Plan:    Initial labs drawn. Prenatal vitamins.  Counseling provided regarding continued use of seat belts, cessation of alcohol consumption, smoking or use of illicit drugs; infection precautions i.e., influenza/TDAP immunizations, toxoplasmosis,CMV, parvovirus, listeria and varicella; workplace  safety, exercise during pregnancy; routine dental care, safe medications, sexual activity, hot tubs, saunas, pools, travel, caffeine use, fish and methlymercury, potential toxins, hair treatments, varicose veins Weight gain recommendations per IOM guidelines reviewed: underweight/BMI< 18.5--> gain 28 - 40 lbs; normal weight/BMI 18.5 - 24.9--> gain 25 - 35 lbs; overweight/BMI 25 - 29.9--> gain 15 - 25 lbs; obese/BMI >30->gain  11 - 20 lbs Problem list reviewed and updated. FIRST/CF mutation testing/NIPT/QUAD SCREEN discussed:  n/a Role of ultrasound in pregnancy discussed; fetal survey: requested. Amniocentesis discussed: not indicated. VBAC calculator score: VBAC consent form provided Follow up in 2 weeks. 50% of 20 min visit spent on counseling and coordination of care.

## 2013-06-27 ENCOUNTER — Encounter: Payer: Self-pay | Admitting: Obstetrics

## 2013-06-27 LAB — WET PREP BY MOLECULAR PROBE
CANDIDA SPECIES: NEGATIVE
Gardnerella vaginalis: POSITIVE — AB
Trichomonas vaginosis: NEGATIVE

## 2013-06-27 LAB — HIV ANTIBODY (ROUTINE TESTING W REFLEX): HIV: NONREACTIVE

## 2013-06-27 LAB — PAP IG W/ RFLX HPV ASCU

## 2013-06-27 LAB — OBSTETRIC PANEL
ANTIBODY SCREEN: NEGATIVE
BASOS ABS: 0 10*3/uL (ref 0.0–0.1)
BASOS PCT: 0 % (ref 0–1)
EOS ABS: 0.1 10*3/uL (ref 0.0–0.7)
Eosinophils Relative: 1 % (ref 0–5)
HEMATOCRIT: 37.7 % (ref 36.0–46.0)
Hemoglobin: 13.3 g/dL (ref 12.0–15.0)
Hepatitis B Surface Ag: NEGATIVE
Lymphocytes Relative: 26 % (ref 12–46)
Lymphs Abs: 3 10*3/uL (ref 0.7–4.0)
MCH: 30.3 pg (ref 26.0–34.0)
MCHC: 35.3 g/dL (ref 30.0–36.0)
MCV: 85.9 fL (ref 78.0–100.0)
MONO ABS: 0.8 10*3/uL (ref 0.1–1.0)
Monocytes Relative: 7 % (ref 3–12)
NEUTROS ABS: 7.6 10*3/uL (ref 1.7–7.7)
NEUTROS PCT: 66 % (ref 43–77)
Platelets: 323 10*3/uL (ref 150–400)
RBC: 4.39 MIL/uL (ref 3.87–5.11)
RDW: 13 % (ref 11.5–15.5)
Rh Type: POSITIVE
Rubella: 3.31 Index — ABNORMAL HIGH (ref <0.90)
WBC: 11.5 10*3/uL — ABNORMAL HIGH (ref 4.0–10.5)

## 2013-06-27 LAB — GC/CHLAMYDIA PROBE AMP
CT PROBE, AMP APTIMA: NEGATIVE
GC PROBE AMP APTIMA: NEGATIVE

## 2013-06-27 LAB — VARICELLA ZOSTER ANTIBODY, IGG: Varicella IgG: 2508 Index — ABNORMAL HIGH (ref <135.00)

## 2013-06-27 LAB — TSH: TSH: 1.039 u[IU]/mL (ref 0.350–4.500)

## 2013-06-27 LAB — VITAMIN D 25 HYDROXY (VIT D DEFICIENCY, FRACTURES): VIT D 25 HYDROXY: 42 ng/mL (ref 30–89)

## 2013-06-28 LAB — CULTURE, OB URINE

## 2013-06-30 LAB — HEMOGLOBINOPATHY EVALUATION
HGB A2 QUANT: 2.9 % (ref 2.2–3.2)
HGB A: 97.1 % (ref 96.8–97.8)
HGB F QUANT: 0 % (ref 0.0–2.0)
Hemoglobin Other: 0 %
Hgb S Quant: 0 %

## 2013-07-01 ENCOUNTER — Ambulatory Visit (INDEPENDENT_AMBULATORY_CARE_PROVIDER_SITE_OTHER): Payer: Medicaid Other

## 2013-07-01 DIAGNOSIS — Z363 Encounter for antenatal screening for malformations: Secondary | ICD-10-CM

## 2013-07-01 DIAGNOSIS — Z3400 Encounter for supervision of normal first pregnancy, unspecified trimester: Secondary | ICD-10-CM

## 2013-07-01 DIAGNOSIS — Z1389 Encounter for screening for other disorder: Secondary | ICD-10-CM

## 2013-07-02 ENCOUNTER — Encounter: Payer: Self-pay | Admitting: Obstetrics

## 2013-07-02 LAB — US OB DETAIL + 14 WK

## 2013-07-10 ENCOUNTER — Other Ambulatory Visit: Payer: Medicaid Other

## 2013-07-10 ENCOUNTER — Encounter: Payer: Self-pay | Admitting: Obstetrics

## 2013-07-10 ENCOUNTER — Encounter: Payer: Medicaid Other | Admitting: Obstetrics

## 2013-07-10 ENCOUNTER — Ambulatory Visit (INDEPENDENT_AMBULATORY_CARE_PROVIDER_SITE_OTHER): Payer: Medicaid Other | Admitting: Obstetrics

## 2013-07-10 VITALS — BP 111/73 | Temp 98.3°F | Wt 209.2 lb

## 2013-07-10 DIAGNOSIS — B9689 Other specified bacterial agents as the cause of diseases classified elsewhere: Secondary | ICD-10-CM

## 2013-07-10 DIAGNOSIS — A499 Bacterial infection, unspecified: Secondary | ICD-10-CM

## 2013-07-10 DIAGNOSIS — N390 Urinary tract infection, site not specified: Secondary | ICD-10-CM

## 2013-07-10 DIAGNOSIS — Z3400 Encounter for supervision of normal first pregnancy, unspecified trimester: Secondary | ICD-10-CM

## 2013-07-10 DIAGNOSIS — N760 Acute vaginitis: Secondary | ICD-10-CM

## 2013-07-10 LAB — POCT URINALYSIS DIPSTICK
Bilirubin, UA: NEGATIVE
GLUCOSE UA: NEGATIVE
Ketones, UA: NEGATIVE
Leukocytes, UA: NEGATIVE
Nitrite, UA: NEGATIVE
RBC UA: NEGATIVE
SPEC GRAV UA: 1.02
UROBILINOGEN UA: NEGATIVE
pH, UA: 5

## 2013-07-10 MED ORDER — AMOXICILLIN 500 MG PO CAPS: 500.0000 mg | ORAL_CAPSULE | Freq: Three times a day (TID) | ORAL | Status: DC

## 2013-07-10 MED ORDER — METRONIDAZOLE 500 MG PO TABS: 500.0000 mg | ORAL_TABLET | Freq: Two times a day (BID) | ORAL | Status: DC

## 2013-07-10 NOTE — Progress Notes (Signed)
Pulse 82, patient states no concerns

## 2013-07-11 ENCOUNTER — Encounter: Payer: Self-pay | Admitting: Obstetrics

## 2013-07-21 ENCOUNTER — Other Ambulatory Visit: Payer: Self-pay | Admitting: *Deleted

## 2013-07-22 ENCOUNTER — Ambulatory Visit (INDEPENDENT_AMBULATORY_CARE_PROVIDER_SITE_OTHER): Payer: Medicaid Other | Admitting: Obstetrics

## 2013-07-22 ENCOUNTER — Encounter: Payer: Self-pay | Admitting: Obstetrics

## 2013-07-22 VITALS — BP 117/76 | HR 81 | Temp 98.0°F | Wt 217.0 lb

## 2013-07-22 DIAGNOSIS — J302 Other seasonal allergic rhinitis: Secondary | ICD-10-CM

## 2013-07-22 DIAGNOSIS — K219 Gastro-esophageal reflux disease without esophagitis: Secondary | ICD-10-CM

## 2013-07-22 DIAGNOSIS — J309 Allergic rhinitis, unspecified: Secondary | ICD-10-CM

## 2013-07-22 DIAGNOSIS — Z3400 Encounter for supervision of normal first pregnancy, unspecified trimester: Secondary | ICD-10-CM

## 2013-07-22 LAB — POCT URINALYSIS DIPSTICK
Bilirubin, UA: NEGATIVE
Blood, UA: NEGATIVE
GLUCOSE UA: NEGATIVE
KETONES UA: NEGATIVE
Leukocytes, UA: NEGATIVE
Nitrite, UA: NEGATIVE
SPEC GRAV UA: 1.01
Urobilinogen, UA: NEGATIVE
pH, UA: 8

## 2013-07-22 MED ORDER — LORATADINE 10 MG PO TABS: 10.0000 mg | ORAL_TABLET | Freq: Every day | ORAL | Status: DC

## 2013-07-22 MED ORDER — OMEPRAZOLE 20 MG PO CPDR: 20.0000 mg | DELAYED_RELEASE_CAPSULE | Freq: Every day | ORAL | Status: DC

## 2013-07-22 NOTE — Progress Notes (Signed)
Subjective:    [name] [name] is a 28 y.o. female being seen today for her obstetrical visit. She is at [redacted]w[redacted]d gestation. Patient reports seasonal allergies and heartburn.  Fetal movement: normal.   Past Medical History  Diagnosis Date  . Abnormal Pap smear of cervix   . Irregular periods   . Allergic rhinitis     Past Surgical History  Procedure Laterality Date  . No past surgeries      Current outpatient prescriptions:acetaminophen (TYLENOL) 500 MG tablet, Take 500 mg by mouth every 6 (six) hours as needed., Disp: , Rfl: ;  butalbital-acetaminophen-caffeine (FIORICET) 50-325-40 MG per tablet, Take 2 tablets by mouth every 6 (six) hours as needed for headache., Disp: 40 tablet, Rfl: 4;  Prenat w/o A-FeCbn-DSS-FA-DHA (CITRANATAL HARMONY) 27-1-250 MG CAPS, Take 1 capsule by mouth daily before breakfast., Disp: 90 capsule, Rfl: 3 valACYclovir (VALTREX) 1000 MG tablet, Take 1 tablet (1,000 mg total) by mouth daily., Disp: 30 tablet, Rfl: 11 Allergies  Allergen Reactions  . Vicodin [Hydrocodone-Acetaminophen]     GI s/e not true allergies     History  Substance Use Topics  . Smoking status: Never Smoker   . Smokeless tobacco: Never Used  . Alcohol Use: Yes     Comment: socially     Family History  Problem Relation Age of Onset  . CAD Neg Hx   . Diabetes Neg Hx   . Cancer      bone marrow, aunt   . Breast cancer Other     great aunt  . Colon cancer Neg Hx   . Hypertension Neg Hx   . Heart disease Neg Hx      Review of Systems Constitutional: negative for anorexia Gastrointestinal: negative for abdominal pain Genitourinary:negative for vaginal discharge Musculoskeletal:negative for back pain Behavioral/Psych: negative for depression and tobacco use   Objective:    BP 117/76  Pulse 81  Temp(Src) 98 F (36.7 C)  Wt 217 lb (98.431 kg) FHT:  140 BPM  Uterine Size: size equals dates  Presentation: unsure     Assessment:    Pregnancy @ [redacted]w[redacted]d weeks   Seasonal rhinitis  GERD   Plan:   Claritin Rx for allergies Omeprazole Rx for GERD   labs reviewed, problem list updated Consent signed. GBS sent TDAP offered  Rhogam given for RH negative Pediatrician: discussed. Infant feeding: plans to breastfeed. Maternity leave: discussed. Cigarette smoking: never smoked. Orders Placed This Encounter  Procedures  . POCT urinalysis dipstick   No orders of the defined types were placed in this encounter.   Follow up in 1 Week.

## 2013-07-23 MED ORDER — OMEPRAZOLE 20 MG PO CPDR: 20.0000 mg | DELAYED_RELEASE_CAPSULE | Freq: Every day | ORAL | Status: DC

## 2013-07-23 NOTE — Addendum Note (Signed)
Addended by: Shelly Bombard on: 07/23/2013 02:46 AM   Modules accepted: Orders

## 2013-07-31 ENCOUNTER — Ambulatory Visit (INDEPENDENT_AMBULATORY_CARE_PROVIDER_SITE_OTHER): Payer: Medicaid Other | Admitting: Obstetrics

## 2013-07-31 VITALS — BP 138/83 | HR 80 | Temp 98.0°F | Wt 216.0 lb

## 2013-07-31 DIAGNOSIS — Z3400 Encounter for supervision of normal first pregnancy, unspecified trimester: Secondary | ICD-10-CM

## 2013-08-01 ENCOUNTER — Encounter: Payer: Self-pay | Admitting: Obstetrics

## 2013-08-01 NOTE — Progress Notes (Signed)
Subjective:    [name] [name] is a 28 y.o. female being seen today for her obstetrical visit. She is at [redacted]w[redacted]d gestation. Patient reports no complaints. Fetal movement: normal.  Problem List Items Addressed This Visit   None    Visit Diagnoses   Supervision of normal first pregnancy    -  Primary    Relevant Orders       POCT urinalysis dipstick      Patient Active Problem List   Diagnosis Date Noted  . GERD without esophagitis 07/22/2013  . Allergic rhinitis, seasonal 07/22/2013  . Herpetic vulvovaginitis 06/26/2013  . Headache 06/26/2013  . Annual physical exam 05/23/2013  . Headache(784.0) 05/23/2013  . Abnormal Pap smear of cervix   . Irregular periods     Objective:    BP 138/83  Pulse 80  Temp(Src) 98 F (36.7 C)  Wt 216 lb (97.977 kg) FHT: 140 BPM  Uterine Size: size equals dates  Presentations: unsure  Pelvic Exam:     Assessment:    Pregnancy @ [redacted]w[redacted]d weeks   Plan:   Plans for delivery: Vaginal anticipated; labs reviewed; problem list updated Counseling: Consent signed. Infant feeding: plans to breastfeed. Cigarette smoking: never smoked. L&D discussion: symptoms of labor, discussed when to call, discussed what number to call, anesthetic/analgesic options reviewed and delivering clinician:  plans Physician. Postpartum supports and preparation: circumcision discussed and contraception plans discussed.  Follow up in 1 Week.

## 2013-08-07 ENCOUNTER — Ambulatory Visit (INDEPENDENT_AMBULATORY_CARE_PROVIDER_SITE_OTHER): Payer: Medicaid Other | Admitting: Obstetrics

## 2013-08-07 VITALS — BP 134/84 | HR 84 | Temp 98.0°F | Wt 219.0 lb

## 2013-08-07 DIAGNOSIS — Z3400 Encounter for supervision of normal first pregnancy, unspecified trimester: Secondary | ICD-10-CM

## 2013-08-07 LAB — POCT URINALYSIS DIPSTICK
BILIRUBIN UA: NEGATIVE
Blood, UA: NEGATIVE
Glucose, UA: NEGATIVE
KETONES UA: NEGATIVE
Nitrite, UA: NEGATIVE
Protein, UA: NEGATIVE
Spec Grav, UA: 1.025
Urobilinogen, UA: NEGATIVE
pH, UA: 5

## 2013-08-14 ENCOUNTER — Encounter: Payer: Self-pay | Admitting: Obstetrics

## 2013-08-14 ENCOUNTER — Encounter: Payer: Self-pay | Admitting: *Deleted

## 2013-08-14 ENCOUNTER — Ambulatory Visit (INDEPENDENT_AMBULATORY_CARE_PROVIDER_SITE_OTHER): Payer: Medicaid Other | Admitting: Obstetrics

## 2013-08-14 VITALS — BP 121/83 | HR 73 | Temp 97.7°F | Wt 225.0 lb

## 2013-08-14 DIAGNOSIS — Z3400 Encounter for supervision of normal first pregnancy, unspecified trimester: Secondary | ICD-10-CM

## 2013-08-14 LAB — POCT URINALYSIS DIPSTICK
Blood, UA: NEGATIVE
GLUCOSE UA: NEGATIVE
Ketones, UA: NEGATIVE
LEUKOCYTES UA: NEGATIVE
Nitrite, UA: NEGATIVE
PROTEIN UA: NEGATIVE
Spec Grav, UA: 1.02
pH, UA: 5

## 2013-08-14 NOTE — Progress Notes (Signed)
Subjective:    [name] [name] is a 28 y.o. female being seen today for her obstetrical visit. She is at [redacted]w[redacted]d gestation. Patient reports fatigue and swelling of feet. Fetal movement: normal.  Problem List Items Addressed This Visit   None    Visit Diagnoses   Supervision of normal first pregnancy    -  Primary    Relevant Orders       POCT urinalysis dipstick      Patient Active Problem List   Diagnosis Date Noted  . GERD without esophagitis 07/22/2013  . Allergic rhinitis, seasonal 07/22/2013  . Herpetic vulvovaginitis 06/26/2013  . Headache 06/26/2013  . Annual physical exam 05/23/2013  . Headache(784.0) 05/23/2013  . Abnormal Pap smear of cervix   . Irregular periods     Objective:    BP 121/83  Pulse 73  Temp(Src) 97.7 F (36.5 C)  Wt 225 lb (102.059 kg) FHT: 140 BPM  Uterine Size: size equals dates  Presentations: unsure  Pelvic Exam: Deferred                         Assessment:    Pregnancy @ [redacted]w[redacted]d weeks   Plan:   Plans for delivery: Vaginal anticipated; labs reviewed; problem list updated Counseling: Consent signed. Infant feeding: plans to breastfeed. Cigarette smoking: never smoked. L&D discussion: symptoms of labor, discussed when to call, discussed what number to call, anesthetic/analgesic options reviewed and delivering clinician:  plans Physician. Postpartum supports and preparation: circumcision discussed and contraception plans discussed.  Follow up in 1 Week.

## 2013-08-15 ENCOUNTER — Encounter (HOSPITAL_COMMUNITY): Payer: Medicaid Other | Admitting: Anesthesiology

## 2013-08-15 ENCOUNTER — Inpatient Hospital Stay (HOSPITAL_COMMUNITY): Payer: Medicaid Other | Admitting: Anesthesiology

## 2013-08-15 ENCOUNTER — Inpatient Hospital Stay (HOSPITAL_COMMUNITY)
Admission: AD | Admit: 2013-08-15 | Discharge: 2013-08-18 | DRG: 774 | Disposition: A | Discharge status: Home / Self Care | Payer: Medicaid Other | Source: Ambulatory Visit | Attending: Obstetrics | Admitting: Obstetrics

## 2013-08-15 ENCOUNTER — Encounter (HOSPITAL_COMMUNITY): Payer: Self-pay

## 2013-08-15 DIAGNOSIS — O98519 Other viral diseases complicating pregnancy, unspecified trimester: Secondary | ICD-10-CM | POA: Diagnosis present

## 2013-08-15 DIAGNOSIS — A6000 Herpesviral infection of urogenital system, unspecified: Secondary | ICD-10-CM | POA: Diagnosis present

## 2013-08-15 LAB — CBC
HCT: 36.7 % (ref 36.0–46.0)
HEMOGLOBIN: 13.1 g/dL (ref 12.0–15.0)
MCH: 30.6 pg (ref 26.0–34.0)
MCHC: 35.7 g/dL (ref 30.0–36.0)
MCV: 85.7 fL (ref 78.0–100.0)
Platelets: 253 10*3/uL (ref 150–400)
RBC: 4.28 MIL/uL (ref 3.87–5.11)
RDW: 12.8 % (ref 11.5–15.5)
WBC: 10.5 10*3/uL (ref 4.0–10.5)

## 2013-08-15 LAB — TYPE AND SCREEN
ABO/RH(D): A POS
Antibody Screen: NEGATIVE

## 2013-08-15 LAB — ABO/RH: ABO/RH(D): A POS

## 2013-08-15 LAB — POCT FERN TEST: POCT FERN TEST: POSITIVE

## 2013-08-15 LAB — RPR

## 2013-08-15 LAB — OB RESULTS CONSOLE GBS: GBS: POSITIVE

## 2013-08-15 MED ORDER — PENICILLIN G POTASSIUM 5000000 UNITS IJ SOLR
5.0000 10*6.[IU] | Freq: Once | INTRAVENOUS | Status: AC
  Administered 2013-08-15: 5 10*6.[IU] via INTRAVENOUS
  Filled 2013-08-15: qty 5

## 2013-08-15 MED ORDER — FENTANYL CITRATE 0.05 MG/ML IJ SOLN
INTRAMUSCULAR | Status: DC
Start: 2013-08-15 — End: 2013-08-16
  Filled 2013-08-15: qty 2

## 2013-08-15 MED ORDER — FENTANYL 2.5 MCG/ML BUPIVACAINE 1/10 % EPIDURAL INFUSION (WH - ANES)
14.0000 mL/h | INTRAMUSCULAR | Status: DC | PRN
  Administered 2013-08-15 (×2): 14 mL/h via EPIDURAL
  Filled 2013-08-15 (×3): qty 125

## 2013-08-15 MED ORDER — EPHEDRINE 5 MG/ML INJ
10.0000 mg | INTRAVENOUS | Status: DC | PRN
  Filled 2013-08-15: qty 2
  Filled 2013-08-15: qty 4

## 2013-08-15 MED ORDER — PHENYLEPHRINE 40 MCG/ML (10ML) SYRINGE FOR IV PUSH (FOR BLOOD PRESSURE SUPPORT)
80.0000 ug | PREFILLED_SYRINGE | INTRAVENOUS | Status: DC | PRN
  Filled 2013-08-15: qty 10
  Filled 2013-08-15: qty 2

## 2013-08-15 MED ORDER — NALBUPHINE HCL 10 MG/ML IJ SOLN
10.0000 mg | INTRAMUSCULAR | Status: DC | PRN
  Administered 2013-08-15: 10 mg via INTRAVENOUS
  Filled 2013-08-15: qty 1

## 2013-08-15 MED ORDER — SODIUM BICARBONATE 8.4 % IV SOLN
INTRAVENOUS | Status: DC | PRN
  Administered 2013-08-15: 5 mL via EPIDURAL

## 2013-08-15 MED ORDER — EPHEDRINE 5 MG/ML INJ
10.0000 mg | INTRAVENOUS | Status: DC | PRN
  Filled 2013-08-15: qty 2

## 2013-08-15 MED ORDER — PENICILLIN G POTASSIUM 5000000 UNITS IJ SOLR
2.5000 10*6.[IU] | INTRAMUSCULAR | Status: DC
Start: 2013-08-15 — End: 2013-08-15

## 2013-08-15 MED ORDER — PHENYLEPHRINE 40 MCG/ML (10ML) SYRINGE FOR IV PUSH (FOR BLOOD PRESSURE SUPPORT)
80.0000 ug | PREFILLED_SYRINGE | INTRAVENOUS | Status: DC | PRN
  Filled 2013-08-15: qty 2

## 2013-08-15 MED ORDER — FENTANYL 2.5 MCG/ML BUPIVACAINE 1/10 % EPIDURAL INFUSION (WH - ANES): 14.0000 mL/h | INTRAMUSCULAR | Status: DC | PRN

## 2013-08-15 MED ORDER — ONDANSETRON HCL 4 MG/2ML IJ SOLN: 4.0000 mg | Freq: Four times a day (QID) | INTRAMUSCULAR | Status: DC | PRN

## 2013-08-15 MED ORDER — FENTANYL CITRATE 0.05 MG/ML IJ SOLN
100.0000 ug | Freq: Once | INTRAMUSCULAR | Status: AC
  Administered 2013-08-15: 100 ug via EPIDURAL

## 2013-08-15 MED ORDER — OXYTOCIN 40 UNITS IN LACTATED RINGERS INFUSION - SIMPLE MED
62.5000 mL/h | INTRAVENOUS | Status: DC
  Filled 2013-08-15: qty 1000

## 2013-08-15 MED ORDER — CITRIC ACID-SODIUM CITRATE 334-500 MG/5ML PO SOLN
30.0000 mL | ORAL | Status: DC | PRN
  Filled 2013-08-15: qty 15

## 2013-08-15 MED ORDER — LIDOCAINE HCL (PF) 1 % IJ SOLN
30.0000 mL | INTRAMUSCULAR | Status: DC | PRN
  Filled 2013-08-15: qty 30

## 2013-08-15 MED ORDER — LACTATED RINGERS IV SOLN
INTRAVENOUS | Status: DC
  Administered 2013-08-15: 125 mL/h via INTRAVENOUS
  Administered 2013-08-15: 10:00:00 via INTRAVENOUS

## 2013-08-15 MED ORDER — FLEET ENEMA 7-19 GM/118ML RE ENEM: 1.0000 | ENEMA | RECTAL | Status: DC | PRN

## 2013-08-15 MED ORDER — DIPHENHYDRAMINE HCL 50 MG/ML IJ SOLN: 12.5000 mg | INTRAMUSCULAR | Status: DC | PRN

## 2013-08-15 MED ORDER — OXYCODONE-ACETAMINOPHEN 5-325 MG PO TABS: 1.0000 | ORAL_TABLET | ORAL | Status: DC | PRN

## 2013-08-15 MED ORDER — OXYTOCIN BOLUS FROM INFUSION
500.0000 mL | INTRAVENOUS | Status: DC
Start: 2013-08-15 — End: 2013-08-16

## 2013-08-15 MED ORDER — IBUPROFEN 600 MG PO TABS: 600.0000 mg | ORAL_TABLET | Freq: Four times a day (QID) | ORAL | Status: DC | PRN

## 2013-08-15 MED ORDER — LACTATED RINGERS IV SOLN: 500.0000 mL | Freq: Once | INTRAVENOUS | Status: DC

## 2013-08-15 MED ORDER — LACTATED RINGERS IV SOLN: 500.0000 mL | INTRAVENOUS | Status: DC | PRN

## 2013-08-15 MED ORDER — OXYTOCIN 40 UNITS IN LACTATED RINGERS INFUSION - SIMPLE MED
1.0000 m[IU]/min | INTRAVENOUS | Status: DC
  Administered 2013-08-15: 1 m[IU]/min via INTRAVENOUS

## 2013-08-15 MED ORDER — ACETAMINOPHEN 325 MG PO TABS: 650.0000 mg | ORAL_TABLET | ORAL | Status: DC | PRN

## 2013-08-15 MED ORDER — TERBUTALINE SULFATE 1 MG/ML IJ SOLN: 0.2500 mg | Freq: Once | INTRAMUSCULAR | Status: AC | PRN

## 2013-08-15 MED ORDER — FENTANYL CITRATE 0.05 MG/ML IJ SOLN
INTRAMUSCULAR | Status: DC | PRN
  Administered 2013-08-15: 100 ug via EPIDURAL

## 2013-08-15 MED ORDER — BUPIVACAINE HCL (PF) 0.25 % IJ SOLN
INTRAMUSCULAR | Status: DC | PRN
  Administered 2013-08-15: 10 mL via EPIDURAL

## 2013-08-15 MED ORDER — DEXTROSE 5 % IV SOLN: 5.0000 10*6.[IU] | Freq: Once | INTRAVENOUS | Status: DC

## 2013-08-15 MED ORDER — PENICILLIN G POTASSIUM 5000000 UNITS IJ SOLR
2.5000 10*6.[IU] | INTRAVENOUS | Status: DC
  Administered 2013-08-15 – 2013-08-16 (×4): 2.5 10*6.[IU] via INTRAVENOUS
  Filled 2013-08-15 (×9): qty 2.5

## 2013-08-15 NOTE — Anesthesia Preprocedure Evaluation (Signed)

## 2013-08-15 NOTE — Anesthesia Procedure Notes (Signed)
Epidural Patient location during procedure: OB  Preanesthetic Checklist Completed: patient identified, site marked, surgical consent, pre-op evaluation, timeout performed, IV checked, risks and benefits discussed and monitors and equipment checked  Epidural Patient position: sitting Prep: site prepped and draped and DuraPrep Patient monitoring: continuous pulse ox and blood pressure Approach: midline Injection technique: LOR air  Needle:  Needle type: Tuohy  Needle gauge: 17 G Needle length: 9 cm and 9 Needle insertion depth: 8 cm Catheter type: closed end flexible Catheter size: 19 Gauge Catheter at skin depth: 15 cm Test dose: negative  Assessment Events: blood not aspirated, injection not painful, no injection resistance, negative IV test and no paresthesia  Additional Notes Dosing of Epidural:  1st dose, through catheter ............................................Marland Kitchen epi 1:200K + Xylocaine 40 mg  2nd dose, through catheter, after waiting 3 minutes...Marland KitchenMarland Kitchenepi 1:200K + Xylocaine 60 mg    ( 2% Xylo charted as a single dose in Epic Meds for ease of charting; actual dosing was fractionated as above, for saftey's sake)  As each dose occurred, patient was free of IV sx; and patient exhibited no evidence of SA injection.  Patient is more comfortable after epidural dosed. Please see RN's note for documentation of vital signs,and FHR which are stable.  Patient reminded not to try to ambulate with numb legs, and that an RN must be present when she attempts to get up.

## 2013-08-15 NOTE — Progress Notes (Signed)
[name] [name] is a 28 y.o. G1P0 at [redacted]w[redacted]d by ultrasound admitted for active labor, rupture of membranes  Subjective: Patient uncomfortable, requesting CLE.  Objective: BP 124/80  Pulse 87  Temp(Src) 98.4 F (36.9 C) (Oral)  Resp 18  Ht 5\' 2"  (1.575 m)  Wt 222 lb 12.8 oz (101.061 kg)  BMI 40.74 kg/m2      FHT:  FHR: 125 bpm, variability: moderate,  accelerations:  Present,  decelerations:  Absent UC:   regular, every 2-5 minutes SVE:   Dilation: 3 Effacement (%): 60 Station: -2 Exam by:: D Simpson RN  Labs: Lab Results  Component Value Date   WBC 10.5 08/15/2013   HGB 13.1 08/15/2013   HCT 36.7 08/15/2013   MCV 85.7 08/15/2013   PLT 253 08/15/2013    Assessment / Plan: Spontaneous labor, progressing normally  Labor: Progressing normally Preeclampsia:  NA Fetal Wellbeing:  Category I Pain Control:  Labor support without medications I/D:  n/a Anticipated MOD:  NSVD  [name] [name] Thereasa Parkin 08/15/2013, 8:19 AM

## 2013-08-15 NOTE — MAU Note (Signed)
Pt states SROM at 130am clear fluid. Denies vag bleeding. +FM. UC every 4-6 mins.

## 2013-08-15 NOTE — H&P (Signed)
[name] [name] is a 28 y.o. female presenting for leaking fluid. Maternal Medical History:  Reason for admission: Rupture of membranes and contractions.   Fetal activity: Perceived fetal activity is normal.   Last perceived fetal movement was within the past hour.    Prenatal complications: no prenatal complications Prenatal Complications - Diabetes: none.    OB History   Grav Para Term Preterm Abortions TAB SAB Ect Mult Living   1              Past Medical History  Diagnosis Date  . Abnormal Pap smear of cervix   . Irregular periods   . Allergic rhinitis   . Genital herpes    Past Surgical History  Procedure Laterality Date  . No past surgeries     Family History: family history includes Breast cancer in her other; Cancer in an other family member. There is no history of CAD, Diabetes, Colon cancer, Hypertension, or Heart disease. Social History:  reports that she has never smoked. She has never used smokeless tobacco. She reports that she drinks alcohol. She reports that she does not use illicit drugs.   Prenatal Transfer Tool  Maternal Diabetes: No Genetic Screening: Normal Maternal Ultrasounds/Referrals: Normal Fetal Ultrasounds or other Referrals:  None Maternal Substance Abuse:  No Significant Maternal Medications:  None Significant Maternal Lab Results:  None Other Comments:  None  Review of Systems  All other systems reviewed and are negative.   Dilation: 3 Effacement (%): 60 Station: -2 Exam by:: D Simpson RN Blood pressure 124/80, pulse 87, temperature 98.4 F (36.9 C), temperature source Oral, resp. rate 18, height 5\' 2"  (1.575 m), weight 222 lb 12.8 oz (101.061 kg). Maternal Exam:  Uterine Assessment: Contraction strength is mild.  Abdomen: Patient reports no abdominal tenderness. Fetal presentation: vertex  Introitus: Normal vulva. Normal vagina.  Pelvis: adequate for delivery.   Cervix: Cervix evaluated by sterile speculum exam and digital exam.      Physical Exam  Constitutional: She is oriented to person, place, and time. She appears well-developed and well-nourished.  HENT:  Head: Normocephalic and atraumatic.  Eyes: Conjunctivae are normal. Pupils are equal, round, and reactive to light.  Neck: Normal range of motion. Neck supple.  Cardiovascular: Normal rate and regular rhythm.   Respiratory: Effort normal.  GI: Soft.  Genitourinary: Vagina normal and uterus normal.  Musculoskeletal: Normal range of motion.  Neurological: She is alert and oriented to person, place, and time.  Skin: Skin is warm and dry.  Psychiatric: She has a normal mood and affect. Her behavior is normal. Judgment and thought content normal.    Prenatal labs: ABO, Rh: A/POS/-- (03/26 1814) Antibody: NEG (03/26 1814) Rubella: 3.31 (03/26 1814) RPR: NON REAC (03/26 1814)  HBsAg: NEGATIVE (03/26 1814)  HIV: NON REACTIVE (03/26 1814)  GBS:     Assessment/Plan: 38 weeks.  SROM.  Admit.  Pitocin prn.   Shelly Bombard 08/15/2013, 5:18 AM

## 2013-08-15 NOTE — Progress Notes (Signed)
Rhyli Depaula is a 28 y.o. G1P0 at [redacted]w[redacted]d by ultrasound admitted for active labor, rupture of membranes  Subjective: Comfortable w/ CLE  Objective: BP 114/54  Pulse 88  Temp(Src) 97.9 F (36.6 C) (Oral)  Resp 20  Ht 5\' 2"  (1.575 m)  Wt 222 lb 12.8 oz (101.061 kg)  BMI 40.74 kg/m2  SpO2 100%      FHT:  FHR: 125 bpm, variability: moderate,  accelerations:  Present,  decelerations:  Absent UC:   regular, every 2 minutes SVE:   Dilation: 5 Effacement (%): 80;90 Station: -2 Exam by:: Azarel Banner CNM  Labs: Lab Results  Component Value Date   WBC 10.5 08/15/2013   HGB 13.1 08/15/2013   HCT 36.7 08/15/2013   MCV 85.7 08/15/2013   PLT 253 08/15/2013    Assessment / Plan: Spontaneous labor, progressing normally  Labor: Progressing normally Preeclampsia:  NA Fetal Wellbeing:  Category I Pain Control:  Epidural I/D:  n/a Anticipated MOD:  NSVD  Derelle Cockrell Thereasa Parkin 08/15/2013, 12:25 PM

## 2013-08-15 NOTE — Progress Notes (Signed)
[name] [name] is a 28 y.o. G1P0 at [redacted]w[redacted]d by ultrasound admitted for active labor, rupture of membranes  Subjective: Comfortable w/ CLE  Objective: BP 111/65  Pulse 88  Temp(Src) 97.9 F (36.6 C) (Oral)  Resp 18  Ht 5\' 2"  (1.575 m)  Wt 222 lb 12.8 oz (101.061 kg)  BMI 40.74 kg/m2  SpO2 100%      FHT:  FHR: 125 bpm, variability: moderate,  accelerations:  Present,  decelerations:  Absent UC:   regular, every 2-4 minutes SVE:   Dilation: 5 Effacement (%): 80 Station: -2 Exam by:: Tonnya Garbett,CNM  Labs: Lab Results  Component Value Date   WBC 10.5 08/15/2013   HGB 13.1 08/15/2013   HCT 36.7 08/15/2013   MCV 85.7 08/15/2013   PLT 253 08/15/2013    Assessment / Plan: No cervical change in 3 hours Starting pitocin  Labor: Pitocin augmentation started Preeclampsia:  NA Fetal Wellbeing:  Category I Pain Control:  Epidural I/D:  n/a Anticipated MOD:  NSVD  [name] [name] [name] [name] 08/15/2013, 1:17 PM

## 2013-08-15 NOTE — Progress Notes (Addendum)
[name] [name] is a 28 y.o. G1P0 at [redacted]w[redacted]d by ultrasound admitted for active labor, rupture of membranes  Subjective: Feeling more uncomfortable, feeling pressure.  Objective: BP 122/68  Pulse 106  Temp(Src) 98.6 F (37 C) (Oral)  Resp 20  Ht 5\' 2"  (1.575 m)  Wt 222 lb 12.8 oz (101.061 kg)  BMI 40.74 kg/m2  SpO2 100%      FHT:  FHR: 130 bpm, variability: moderate,  accelerations:  Present,  decelerations:  Present bradycardia for 2.5-3 min following IUPC placement. Resolved w/ interventions. FSE placed. Pitocin turned off. Patient turned, O2 mask administered. Currently early decelerations.  UC:   regular, every 2-4 minutes SVE:   Dilation: 6.5 Effacement (%): 80 Station: 0 Exam by:: Michaelyn Barter CNM  Labs: Lab Results  Component Value Date   WBC 10.5 08/15/2013   HGB 13.1 08/15/2013   HCT 36.7 08/15/2013   MCV 85.7 08/15/2013   PLT 253 08/15/2013    Assessment / Plan:  Augmentation of labor, progressing well Patient was started on pitocin however has not exceeded 1 mU and was turned off at this time due to bradycardia. Reevaluate need to turn on pitocin.  Patient and family notified of bradycardia event.  Report given to MD Ruthann Cancer who will resume care.  FSE and IUPC in place. Both difficult to trace due to habitus.  +GBS receiving antibiotics  Labor: Progressing normally Preeclampsia:  NA Fetal Wellbeing:  Category I, Cat 2 tracing resolved Pain Control:  Epidural I/D:  n/a Anticipated MOD:  NSVD  Amalie Koran Thereasa Parkin 08/15/2013, 5:04 PM

## 2013-08-16 ENCOUNTER — Encounter (HOSPITAL_COMMUNITY): Payer: Self-pay | Admitting: *Deleted

## 2013-08-16 LAB — CBC
HCT: 34 % — ABNORMAL LOW (ref 36.0–46.0)
Hemoglobin: 11.9 g/dL — ABNORMAL LOW (ref 12.0–15.0)
MCH: 30.1 pg (ref 26.0–34.0)
MCHC: 35 g/dL (ref 30.0–36.0)
MCV: 85.9 fL (ref 78.0–100.0)
PLATELETS: 231 10*3/uL (ref 150–400)
RBC: 3.96 MIL/uL (ref 3.87–5.11)
RDW: 12.8 % (ref 11.5–15.5)
WBC: 16.2 10*3/uL — AB (ref 4.0–10.5)

## 2013-08-16 MED ORDER — OXYCODONE-ACETAMINOPHEN 5-325 MG PO TABS
1.0000 | ORAL_TABLET | ORAL | Status: DC | PRN
  Administered 2013-08-17: 1 via ORAL
  Filled 2013-08-16: qty 1
  Filled 2013-08-16: qty 2

## 2013-08-16 MED ORDER — FERROUS SULFATE 325 (65 FE) MG PO TABS
325.0000 mg | ORAL_TABLET | Freq: Two times a day (BID) | ORAL | Status: DC
  Administered 2013-08-16 – 2013-08-17 (×4): 325 mg via ORAL
  Filled 2013-08-16 (×4): qty 1

## 2013-08-16 MED ORDER — SENNOSIDES-DOCUSATE SODIUM 8.6-50 MG PO TABS
2.0000 | ORAL_TABLET | ORAL | Status: DC
  Administered 2013-08-16 – 2013-08-18 (×2): 2 via ORAL
  Filled 2013-08-16 (×2): qty 2

## 2013-08-16 MED ORDER — TETANUS-DIPHTH-ACELL PERTUSSIS 5-2.5-18.5 LF-MCG/0.5 IM SUSP
0.5000 mL | Freq: Once | INTRAMUSCULAR | Status: AC
  Administered 2013-08-18: 0.5 mL via INTRAMUSCULAR
  Filled 2013-08-16: qty 0.5

## 2013-08-16 MED ORDER — DIBUCAINE 1 % RE OINT: 1.0000 "application " | TOPICAL_OINTMENT | RECTAL | Status: DC | PRN

## 2013-08-16 MED ORDER — LANOLIN HYDROUS EX OINT: TOPICAL_OINTMENT | CUTANEOUS | Status: DC | PRN

## 2013-08-16 MED ORDER — BENZOCAINE-MENTHOL 20-0.5 % EX AERO
1.0000 "application " | INHALATION_SPRAY | CUTANEOUS | Status: DC | PRN
  Administered 2013-08-16: 1 via TOPICAL
  Filled 2013-08-16: qty 56

## 2013-08-16 MED ORDER — ONDANSETRON HCL 4 MG PO TABS: 4.0000 mg | ORAL_TABLET | ORAL | Status: DC | PRN

## 2013-08-16 MED ORDER — SIMETHICONE 80 MG PO CHEW: 80.0000 mg | CHEWABLE_TABLET | ORAL | Status: DC | PRN

## 2013-08-16 MED ORDER — PRENATAL MULTIVITAMIN CH
1.0000 | ORAL_TABLET | Freq: Every day | ORAL | Status: DC
  Administered 2013-08-16 – 2013-08-17 (×2): 1 via ORAL
  Filled 2013-08-16 (×2): qty 1

## 2013-08-16 MED ORDER — ZOLPIDEM TARTRATE 5 MG PO TABS: 5.0000 mg | ORAL_TABLET | Freq: Every evening | ORAL | Status: DC | PRN

## 2013-08-16 MED ORDER — IBUPROFEN 600 MG PO TABS
600.0000 mg | ORAL_TABLET | Freq: Four times a day (QID) | ORAL | Status: DC
  Administered 2013-08-16 – 2013-08-18 (×9): 600 mg via ORAL
  Filled 2013-08-16 (×10): qty 1

## 2013-08-16 MED ORDER — DIPHENHYDRAMINE HCL 25 MG PO CAPS: 25.0000 mg | ORAL_CAPSULE | Freq: Four times a day (QID) | ORAL | Status: DC | PRN

## 2013-08-16 MED ORDER — WITCH HAZEL-GLYCERIN EX PADS: 1.0000 "application " | MEDICATED_PAD | CUTANEOUS | Status: DC | PRN

## 2013-08-16 MED ORDER — ONDANSETRON HCL 4 MG/2ML IJ SOLN: 4.0000 mg | INTRAMUSCULAR | Status: DC | PRN

## 2013-08-16 NOTE — Progress Notes (Signed)
Clinical Social Work Department PSYCHOSOCIAL ASSESSMENT - MATERNAL/CHILD 08/16/2013  Patient:  [name],[name]  Account Number:  [id number]  Admit Date:  08/15/2013  Childs Name:   [name] [name]    Clinical Social Worker:  Zakee Deerman, LCSW   Date/Time:  08/16/2013 12:45 PM  Date Referred:  08/16/2013      Other referral source:    I:  FAMILY / HOME ENVIRONMENT Child's legal guardian:  PARENT  Guardian - Name Guardian - Age Guardian - Address  [name] [name],[name] [name] 28 [address]  [place], [place] [address]  [name] [name], [name] [name] 29 same as above   Other household support members/support persons Other support:    II  PSYCHOSOCIAL DATA Information Source:    Financial and Community Resources Employment:   Both parents employed   Financial resources:  Medicaid If Medicaid - County:   Other  Food Stamps   School / Grade:   Maternity Care Coordinator / Child Services Coordination / Early Interventions:  Cultural issues impacting care:    III  STRENGTHS Strengths  Supportive family/friends  Home prepared for Child (including basic supplies)  Adequate Resources   Strength comment:    IV  RISK FACTORS AND CURRENT PROBLEMS Current Problem:       V  SOCIAL WORK ASSESSMENT Met with mother who was pleasant and receptive to social work intervention.  She and newborn's father cohabitate. She is a single parent with no other dependents.  FOB have five other children.   Both parents are employed.  Informed that FOB is very supportive.   Mother states that she had limited PNC because she did not find out that she was pregnant until 31 weeks.   Informed her of the hospital's drug screen policy.    She denies hx of mental illness or substance abuse.  No acute social concerns noted or reported at this time.      VI SOCIAL WORK PLAN Social Work Plan  No Barriers to Discharge   Type of pt/family education:   If child protective services report - county:   If child protective services  report - date:   Information/referral to community resources comment:   Other social work plan:   Will continue to monitor drug screen     

## 2013-08-16 NOTE — Plan of Care (Signed)
Problem: Consults Goal: Birthing Suites Patient Information Press F2 to bring up selections list  Outcome: Completed/Met Date Met:  08/16/13  Pt 37-[redacted] weeks EGA

## 2013-08-17 NOTE — Progress Notes (Signed)
Patient ID: [name] [name], female   DOB: Aug 04, 1985, 28 y.o.   MRN: [id number] Postpartum day one Vital signs normal Fundus firm Legs negative

## 2013-08-17 NOTE — Lactation Note (Signed)
This note was copied from the chart of [name]. Lactation Consultation Note: follow up visit with mom Has been giving some formula because baby was having trouble getting latched on and was very hungry. Mom wearing shells since yesterday. Has been using NS with latch. Has her own [employer] pump at bedside and has used it once. Reports that she was able to get baby latched on once without the NS. Baby just had formula and is sleepy now. Suggested DEBP while she is here and mom agreeable. Setup, use and cleaning reviewed with mom. Mom pumping as I left room. No questions at present. To call for assist prn.  Patient Name: [name] [name] XNATF'T Date: 08/17/2013 Reason for consult: Follow-up assessment   Maternal Data Formula Feeding for Exclusion: No Infant to breast within first hour of birth: Yes Does the patient have breastfeeding experience prior to this delivery?: No  Feeding    LATCH Score/Interventions       Type of Nipple: Flat Intervention(s): Shells;Double electric pump              Lactation Tools Discussed/Used Tools: Nipple [place] Fuel [facility] Program: No Pump Review: Setup, frequency, and cleaning Initiated by:: DW Date initiated:: 08/17/13   Consult Status Consult Status: Follow-up Date: 08/18/13 Follow-up type: In-patient    Truddie Crumble 08/17/2013, 3:15 PM

## 2013-08-17 NOTE — Anesthesia Postprocedure Evaluation (Signed)
Anesthesia Post Note  Patient: [name] [name]  Procedure(s) Performed: * No procedures listed *  Anesthesia type: Epidural  Patient location: Mother/Baby  Post pain: Pain level controlled  Post assessment: Post-op Vital signs reviewed  Last Vitals:  Filed Vitals:   08/17/13 0612  BP: 119/81  Pulse: 89  Temp: 36.6 C  Resp: 18    Post vital signs: Reviewed  Level of consciousness:alert  Complications: No apparent anesthesia complications

## 2013-08-18 MED ORDER — IBUPROFEN 600 MG PO TABS: 600.0000 mg | ORAL_TABLET | Freq: Four times a day (QID) | ORAL | Status: DC | PRN

## 2013-08-18 MED ORDER — OXYCODONE-ACETAMINOPHEN 5-325 MG PO TABS: 1.0000 | ORAL_TABLET | ORAL | Status: DC | PRN

## 2013-08-18 NOTE — Progress Notes (Signed)
UR chart review completed.  

## 2013-08-18 NOTE — Progress Notes (Signed)
Post Partum Day 2 Subjective: no complaints  Objective: Blood pressure 116/77, pulse 84, temperature 98 F (36.7 C), temperature source Oral, resp. rate 18, height 5\' 2"  (1.575 m), weight 222 lb 12.8 oz (101.061 kg), SpO2 100.00%, unknown if currently breastfeeding.  Physical Exam:  General: alert and no distress Lochia: appropriate Uterine Fundus: firm Incision: healing well DVT Evaluation: No evidence of DVT seen on physical exam.   Recent Labs  08/16/13 0537  HGB 11.9*  HCT 34.0*    Assessment/Plan: Discharge home   LOS: 3 days   Shelly Bombard 08/18/2013, 6:45 AM

## 2013-08-18 NOTE — Discharge Summary (Signed)
Obstetric Discharge Summary Reason for Admission: onset of labor Prenatal Procedures: ultrasound Intrapartum Procedures: spontaneous vaginal delivery Postpartum Procedures: none Complications-Operative and Postpartum: none Hemoglobin  Date Value Ref Range Status  08/16/2013 11.9* 12.0 - 15.0 g/dL Final     HCT  Date Value Ref Range Status  08/16/2013 34.0* 36.0 - 46.0 % Final    Physical Exam:  General: alert and no distress Lochia: appropriate Uterine Fundus: firm Incision: healing well DVT Evaluation: No evidence of DVT seen on physical exam.  Discharge Diagnoses: Term Pregnancy-delivered  Discharge Information: Date: 08/18/2013 Activity: pelvic rest Diet: routine Medications: PNV, Ibuprofen, Colace and Percocet Condition: stable Instructions: refer to practice specific booklet Discharge to: home Follow-up Information   Follow up with Nieko Clarin A, MD In 2 weeks.   Specialty:  Obstetrics and Gynecology   Contact information:   [place]  [id number] [phone]       Newborn Data: Live born female  Birth Weight: 6 lb 14 oz (3118 g) APGAR: 8, 9  Home with mother.  Shelly Bombard 08/18/2013, 6:49 AM

## 2013-08-21 ENCOUNTER — Encounter: Payer: Medicaid Other | Admitting: Obstetrics

## 2013-08-28 ENCOUNTER — Encounter: Payer: Medicaid Other | Admitting: Obstetrics

## 2013-09-02 ENCOUNTER — Encounter: Payer: Self-pay | Admitting: Obstetrics

## 2013-09-02 ENCOUNTER — Ambulatory Visit: Payer: Self-pay | Admitting: Obstetrics

## 2013-09-03 ENCOUNTER — Encounter: Payer: Self-pay | Admitting: Obstetrics

## 2013-09-03 ENCOUNTER — Ambulatory Visit (INDEPENDENT_AMBULATORY_CARE_PROVIDER_SITE_OTHER): Payer: Medicaid Other | Admitting: Obstetrics

## 2013-09-03 ENCOUNTER — Encounter: Payer: Self-pay | Admitting: *Deleted

## 2013-09-03 DIAGNOSIS — Z3009 Encounter for other general counseling and advice on contraception: Secondary | ICD-10-CM

## 2013-09-03 NOTE — Progress Notes (Signed)
Subjective:     [name] [name] is a 28 y.o. female who presents for a postpartum visit. She is 2 weeks postpartum following a spontaneous vaginal delivery. I have fully reviewed the prenatal and intrapartum course. The delivery was at 29 gestational weeks. Outcome: spontaneous vaginal delivery. Anesthesia: epidural. Postpartum course has been normal. Baby's course has been normal. Baby is feeding by bottle Dory Horn. Bleeding thin lochia. Bowel function is normal. Bladder function is normal. Patient is not sexually active. Contraception method is abstinence. Postpartum depression screening: negative.  The following portions of the patient's history were reviewed and updated as appropriate: allergies, current medications, past family history, past medical history, past social history, past surgical history and problem list.  Review of Systems A comprehensive review of systems was negative.   Objective:    BP 117/72  Pulse 90  Temp(Src) 98.2 F (36.8 C)  Wt 200 lb (90.719 kg)  General:  alert and no distress PE:  Deferred     Assessment:    2 weeks postpartum.  Doing well.  Contraceptive counseling.  Plan:    1. Contraception: Nexplanon 2. Nexplanon ordered. 3. Follow up in: 4 weeks or as needed.

## 2013-10-07 ENCOUNTER — Encounter: Payer: Self-pay | Admitting: Obstetrics

## 2013-10-07 ENCOUNTER — Ambulatory Visit (INDEPENDENT_AMBULATORY_CARE_PROVIDER_SITE_OTHER): Payer: Medicaid Other | Admitting: Obstetrics

## 2013-10-07 DIAGNOSIS — Z3202 Encounter for pregnancy test, result negative: Secondary | ICD-10-CM

## 2013-10-07 DIAGNOSIS — Z3009 Encounter for other general counseling and advice on contraception: Secondary | ICD-10-CM

## 2013-10-07 MED ORDER — MEDROXYPROGESTERONE ACETATE 150 MG/ML IM SUSP: 150.0000 mg | INTRAMUSCULAR | Status: DC

## 2013-10-07 NOTE — Progress Notes (Signed)
Subjective:     [name] [name] is a 28 y.o. female who presents for a postpartum visit. She is 6 weeks postpartum following a spontaneous vaginal delivery. I have fully reviewed the prenatal and intrapartum course. The delivery was at 39 gestational weeks. Outcome: spontaneous vaginal delivery. Anesthesia: epidural. Postpartum course has been normal. Baby's course has been normal. Baby is feeding by bottle Dory Horn. Bleeding no bleeding. Bowel function is normal. Bladder function is normal. Patient is sexually active. Contraception method is condoms. Postpartum depression screening: negative.  The following portions of the patient's history were reviewed and updated as appropriate: allergies, current medications, past family history, past medical history, past social history, past surgical history and problem list.  Review of Systems A comprehensive review of systems was negative.   Objective:    BP 122/80  Pulse 84  Temp(Src) 98.1 F (36.7 C)  Ht 5\' 2"  (1.575 m)  Wt 210 lb (95.255 kg)  BMI 38.40 kg/m2  LMP 09/25/2013  Breastfeeding? No  General:  alert and no distress   Breasts:  inspection negative, no nipple discharge or bleeding, no masses or nodularity palpable  Lungs: clear  Heart:  regular rate and rhythm, S1, S2 normal, no murmur, click, rub or gallop  Abdomen: normal findings: soft, non-tender   Vulva:  normal  Vagina: normal vagina  Cervix:  no lesions  Corpus: normal size, contour, position, consistency, mobility, non-tender  Adnexa:  no mass, fullness, tenderness  Rectal Exam: Not performed.           Assessment:     Normal postpartum exam. Pap smear not done at today's visit. Contraceptive counseling.  Plan:    1. Contraception: Depo-Provera injections 2. Depo Provera Rx 3. Follow up in: 1 day or as needed. Depo injection.

## 2013-10-08 ENCOUNTER — Ambulatory Visit (INDEPENDENT_AMBULATORY_CARE_PROVIDER_SITE_OTHER): Payer: Medicaid Other | Admitting: *Deleted

## 2013-10-08 VITALS — BP 118/79 | HR 70 | Temp 98.0°F | Ht 62.0 in | Wt 207.0 lb

## 2013-10-08 DIAGNOSIS — Z30013 Encounter for initial prescription of injectable contraceptive: Secondary | ICD-10-CM

## 2013-10-08 DIAGNOSIS — Z3009 Encounter for other general counseling and advice on contraception: Secondary | ICD-10-CM

## 2013-10-08 LAB — POCT URINE PREGNANCY: Preg Test, Ur: NEGATIVE

## 2013-10-08 MED ORDER — MEDROXYPROGESTERONE ACETATE 150 MG/ML IM SUSP
150.0000 mg | INTRAMUSCULAR | Status: AC
  Administered 2013-10-08 – 2014-01-05 (×2): 150 mg via INTRAMUSCULAR

## 2013-10-08 NOTE — Progress Notes (Signed)
Patient is in the office today for her DEPO Injection. This is the Patients first injection. Patients last sexual intercourse was June 20th or 21st with protection. Patients cycle started on September 25, 2013. UPT preformed at yesterdays post partum visit, results were negative. Patient states she believes she is not pregnant. Per Dr. Archie Balboa to give DEPO Injection. Injection given in Left Deltoid. Patient tolerated well. Patient advised she should use back up protection for 1 week. Patient notified to make an appointment with the front for December 30, 2013 for her next DEPO Injection. Patient voiced understanding.   BP 118/79  Pulse 70  Temp(Src) 98 F (36.7 C)  Ht 5\' 2"  (1.575 m)  Wt 207 lb (93.895 kg)  BMI 37.85 kg/m2  LMP 09/25/2013  Results for orders placed in visit on 10/07/13 (from the past 24 hour(s))  POCT URINE PREGNANCY     Status: None   Collection Time    10/08/13 10:56 AM      Result Value Ref Range   Preg Test, Ur Negative     Administrations This Visit   medroxyPROGESTERone (DEPO-PROVERA) injection 150 mg   Administered Action Dose Route Administered By   10/08/2013 Given 150 mg Intramuscular Ladona Ridgel, LPN

## 2013-10-08 NOTE — Addendum Note (Signed)
Addended by: Ladona Ridgel on: 10/08/2013 10:57 AM   Modules accepted: Orders

## 2013-12-29 ENCOUNTER — Ambulatory Visit: Payer: Medicaid Other

## 2014-01-05 ENCOUNTER — Ambulatory Visit (INDEPENDENT_AMBULATORY_CARE_PROVIDER_SITE_OTHER): Payer: Self-pay | Admitting: *Deleted

## 2014-01-05 VITALS — BP 151/83 | HR 76 | Temp 97.9°F | Ht 62.0 in | Wt 214.2 lb

## 2014-01-05 DIAGNOSIS — Z30013 Encounter for initial prescription of injectable contraceptive: Secondary | ICD-10-CM

## 2014-01-05 DIAGNOSIS — Z3042 Encounter for surveillance of injectable contraceptive: Secondary | ICD-10-CM

## 2014-01-05 NOTE — Progress Notes (Signed)
Patient is in the office today for her DEPO Injection. Patient is on time for her Injection. Injection given in Left Deltoid. Patient tolerated well. Patient states she has little desire or sensation to have sexual intercourse and asked if that was normal. Patient notified that it was not normal. Patient states she had a stressful weekend and that her anxiety level was high today. Patients BP was 151/83 patient notified that her BP was probably high due to the stress and anxiety that as long as it wasn't always high like that and didn't stay high she would be fine. Patient notified that it would probably be a good idea to have a consult appointment with Dr. Jodi Mourning, that her little desire or sensation could be due to her stress and anxiety. Patient states she does not have any insurance at the moment but that she would be willing to make an appointment with Dr. Jodi Mourning. Patient and I spoke with Juliann Pulse and patient was notified that the appointment would probably be around $80.00. Patient states her insurance at work should be active soon. Patient notified to speak with her job and that when she decided what she wanted to do to call the office and make a Consult appointment with Dr. Jodi Mourning. Patient also notified to make an appointment for March 29, 2014 for her next DEPO Injection. Patient voiced understanding.   BP 151/83  Pulse 76  Temp(Src) 97.9 F (36.6 C)  Ht 5\' 2"  (1.575 m)  Wt 214 lb 3.2 oz (97.16 kg)  BMI 39.17 kg/m2  LMP 01/01/2014  Breastfeeding? No  Administrations This Visit   medroxyPROGESTERone (DEPO-PROVERA) injection 150 mg   Administered Action Dose Route Administered By   01/05/2014 Given 150 mg Intramuscular Ladona Ridgel, LPN

## 2014-01-08 ENCOUNTER — Other Ambulatory Visit: Payer: Self-pay | Admitting: Obstetrics

## 2014-01-08 DIAGNOSIS — F418 Other specified anxiety disorders: Secondary | ICD-10-CM

## 2014-01-08 MED ORDER — BUSPIRONE HCL 7.5 MG PO TABS: 7.5000 mg | ORAL_TABLET | Freq: Three times a day (TID) | ORAL | Status: DC

## 2014-03-30 ENCOUNTER — Ambulatory Visit: Payer: Self-pay

## 2014-07-23 ENCOUNTER — Encounter (HOSPITAL_COMMUNITY): Payer: Self-pay

## 2014-07-23 ENCOUNTER — Emergency Department (HOSPITAL_COMMUNITY)
Admission: EM | Admit: 2014-07-23 | Discharge: 2014-07-23 | Disposition: A | Discharge status: Home / Self Care | Payer: Medicaid Other | Attending: Emergency Medicine | Admitting: Emergency Medicine

## 2014-07-23 DIAGNOSIS — Z8619 Personal history of other infectious and parasitic diseases: Secondary | ICD-10-CM | POA: Insufficient documentation

## 2014-07-23 DIAGNOSIS — R112 Nausea with vomiting, unspecified: Secondary | ICD-10-CM | POA: Insufficient documentation

## 2014-07-23 DIAGNOSIS — G43009 Migraine without aura, not intractable, without status migrainosus: Secondary | ICD-10-CM

## 2014-07-23 DIAGNOSIS — Z79899 Other long term (current) drug therapy: Secondary | ICD-10-CM | POA: Insufficient documentation

## 2014-07-23 DIAGNOSIS — N926 Irregular menstruation, unspecified: Secondary | ICD-10-CM | POA: Insufficient documentation

## 2014-07-23 DIAGNOSIS — Z3202 Encounter for pregnancy test, result negative: Secondary | ICD-10-CM | POA: Insufficient documentation

## 2014-07-23 DIAGNOSIS — Z7951 Long term (current) use of inhaled steroids: Secondary | ICD-10-CM | POA: Insufficient documentation

## 2014-07-23 DIAGNOSIS — G43909 Migraine, unspecified, not intractable, without status migrainosus: Secondary | ICD-10-CM | POA: Insufficient documentation

## 2014-07-23 DIAGNOSIS — Z793 Long term (current) use of hormonal contraceptives: Secondary | ICD-10-CM | POA: Insufficient documentation

## 2014-07-23 HISTORY — DX: Migraine, unspecified, not intractable, without status migrainosus: G43.909

## 2014-07-23 LAB — POC URINE PREG, ED: Preg Test, Ur: NEGATIVE

## 2014-07-23 MED ORDER — KETOROLAC TROMETHAMINE 30 MG/ML IJ SOLN
30.0000 mg | Freq: Once | INTRAMUSCULAR | Status: AC
  Administered 2014-07-23: 30 mg via INTRAVENOUS
  Filled 2014-07-23: qty 1

## 2014-07-23 MED ORDER — SODIUM CHLORIDE 0.9 % IV BOLUS (SEPSIS)
1000.0000 mL | Freq: Once | INTRAVENOUS | Status: AC
  Administered 2014-07-23: 1000 mL via INTRAVENOUS

## 2014-07-23 MED ORDER — PROCHLORPERAZINE EDISYLATE 5 MG/ML IJ SOLN
10.0000 mg | Freq: Once | INTRAMUSCULAR | Status: AC
  Administered 2014-07-23: 10 mg via INTRAVENOUS
  Filled 2014-07-23: qty 2

## 2014-07-23 NOTE — Discharge Instructions (Signed)

## 2014-07-23 NOTE — ED Provider Notes (Signed)
CSN: [id number]     Arrival date & time 07/23/14  1909 History   First MD Initiated Contact with Patient 07/23/14 1928     Chief Complaint  Patient presents with  . Migraine     (Consider location/radiation/quality/duration/timing/severity/associated sxs/prior Treatment) HPI [name] [name] is a 29 year old female with past medical history of migraines who presents the ER complaining of headache. Patient states her past week she has had a throbbing sensation in the right side of her temporal region of her head. Patient reports associated nausea and vomiting. Patient also reports associated visual disturbance in her right eye, as long as some visual field loss. Patient reports associated tingling sensation in the right side or face. Patient states all the constellation of symptoms he is describing are identical to an consistent with previous episodes of migraine she has had in the past. Patient states she was unable to get relief with her butalbital at home. Patient denies weakness, syncope, dizziness, chest pain, shortness of breath, fever, neck pain, abdominal pain, dysuria.  Past Medical History  Diagnosis Date  . Abnormal Pap smear of cervix   . Irregular periods   . Allergic rhinitis   . Genital herpes   . Migraine    Past Surgical History  Procedure Laterality Date  . No past surgeries     Family History  Problem Relation Age of Onset  . CAD Neg Hx   . Diabetes Neg Hx   . Cancer      bone marrow, aunt   . Breast cancer Other     great aunt  . Colon cancer Neg Hx   . Hypertension Neg Hx   . Heart disease Neg Hx    History  Substance Use Topics  . Smoking status: Never Smoker   . Smokeless tobacco: Never Used  . Alcohol Use: Yes     Comment: socially    OB History    Gravida Para Term Preterm AB TAB SAB Ectopic Multiple Living   1 1 1       1      Review of Systems  Constitutional: Negative for fever.  HENT: Negative for trouble swallowing.   Eyes: Positive for  visual disturbance.  Respiratory: Negative for shortness of breath.   Cardiovascular: Negative for chest pain.  Gastrointestinal: Positive for nausea and vomiting. Negative for abdominal pain.  Genitourinary: Negative for dysuria.  Musculoskeletal: Negative for neck pain.  Skin: Negative for rash.  Neurological: Positive for headaches. Negative for dizziness, weakness and numbness.  Psychiatric/Behavioral: Negative.       Allergies  Vicodin  Home Medications   Prior to Admission medications   Medication Sig Start Date End Date Taking? Authorizing Provider  butalbital-acetaminophen-caffeine (FIORICET, ESGIC) 50-325-40 MG per tablet Take 2 tablets by mouth every 6 (six) hours as needed for migraine (migraine).   Yes Historical Provider, MD  fluticasone (FLONASE) 50 MCG/ACT nasal spray Place 2 sprays into both nostrils daily.   Yes Historical Provider, MD  ibuprofen (ADVIL,MOTRIN) 600 MG tablet Take 1 tablet (600 mg total) by mouth every 6 (six) hours as needed. 08/18/13  Yes Shelly Bombard, MD  busPIRone (BUSPAR) 7.5 MG tablet Take 1 tablet (7.5 mg total) by mouth 3 (three) times daily. Patient not taking: Reported on 07/23/2014 01/08/14   Shelly Bombard, MD  medroxyPROGESTERone (DEPO-PROVERA) 150 MG/ML injection Inject 1 mL (150 mg total) into the muscle every 3 (three) months. Patient not taking: Reported on 07/23/2014 10/07/13   Shelly Bombard, MD  BP 99/67 mmHg  Pulse 95  Temp(Src) 97.6 F (36.4 C) (Oral)  Resp 18  SpO2 98%  LMP 07/09/2014 (Approximate) Physical Exam  Constitutional: She is oriented to person, place, and time. She appears well-developed and well-nourished. No distress.  HENT:  Head: Normocephalic and atraumatic.  Mouth/Throat: Oropharynx is clear and moist. No oropharyngeal exudate.  Eyes: Conjunctivae and EOM are normal. Pupils are equal, round, and reactive to light. Right eye exhibits no discharge. Left eye exhibits no discharge. No scleral icterus.   Neck: Normal range of motion and full passive range of motion without pain. Neck supple. No spinous process tenderness and no muscular tenderness present. No rigidity. No edema, no erythema and normal range of motion present. No Brudzinski's sign and no Kernig's sign noted.  Cardiovascular: Normal rate, regular rhythm, S1 normal, S2 normal and normal heart sounds.   No murmur heard. Pulmonary/Chest: Effort normal and breath sounds normal. No accessory muscle usage. No tachypnea. No respiratory distress.  Abdominal: Soft. Normal appearance and bowel sounds are normal. There is tenderness. There is no rigidity, no guarding, no tenderness at McBurney's point and negative Murphy's sign.  Musculoskeletal: Normal range of motion. She exhibits no edema or tenderness.  Neurological: She is alert and oriented to person, place, and time. She has normal strength. No cranial nerve deficit or sensory deficit. Coordination normal. GCS eye subscore is 4. GCS verbal subscore is 5. GCS motor subscore is 6.  Patient fully alert, answering questions appropriately in full, clear sentences. Cranial nerves II through XII grossly intact. Motor strength 5 out of 5 in all major muscle groups of upper and lower extremities. Distal sensation intact.   Skin: Skin is warm and dry. No rash noted. She is not diaphoretic.  Psychiatric: She has a normal mood and affect.  Nursing note and vitals reviewed.   ED Course  Procedures (including critical care time) Labs Review Labs Reviewed  POC URINE PREG, ED    Imaging Review No results found.   EKG Interpretation None      MDM   Final diagnoses:  Nonintractable migraine, unspecified migraine type    Pt HA treated and improved while in ED.  Presentation is like pts typical HA and non concerning for [facility], [facility], Meningitis, or temporal arteritis. Pt is afebrile with no focal neuro deficits, nuchal rigidity, or change in vision. Pt is to follow up with PCP to discuss  prophylactic medication.  Discussed return precautions with patient, patient verbalizes understanding and agreement of this plan. Encouraged patient to call or return to the ER with any worsening of symptoms or should she have any questions or concerns.  BP 99/67 mmHg  Pulse 95  Temp(Src) 97.6 F (36.4 C) (Oral)  Resp 18  SpO2 98%  LMP 07/09/2014 (Approximate)  Signed,  Dahlia Bailiff, PA-C 11:56 PM      Dahlia Bailiff, PA-C 07/23/14 2356  Quintella Reichert, MD 07/27/14 0900

## 2014-07-23 NOTE — ED Notes (Addendum)
Patient reports a history of chronic migraines.  States she has had a migraine for a week.  Patient states she has vision changes in right eye and numbness to right side of her face.  She reports she has taken her prescription medication, Excedrin Migraine, caffeine, and plenty of water with no relief.  Reports constant nausea and vomiting at least twice daily.

## 2015-02-01 ENCOUNTER — Encounter (HOSPITAL_COMMUNITY): Payer: Self-pay | Admitting: *Deleted

## 2015-02-01 ENCOUNTER — Emergency Department (HOSPITAL_COMMUNITY)
Admission: EM | Admit: 2015-02-01 | Discharge: 2015-02-01 | Disposition: A | Discharge status: Home / Self Care | Payer: Medicaid Other | Attending: Emergency Medicine | Admitting: Emergency Medicine

## 2015-02-01 DIAGNOSIS — N390 Urinary tract infection, site not specified: Secondary | ICD-10-CM

## 2015-02-01 DIAGNOSIS — G43909 Migraine, unspecified, not intractable, without status migrainosus: Secondary | ICD-10-CM | POA: Insufficient documentation

## 2015-02-01 DIAGNOSIS — B349 Viral infection, unspecified: Secondary | ICD-10-CM

## 2015-02-01 DIAGNOSIS — Z7952 Long term (current) use of systemic steroids: Secondary | ICD-10-CM | POA: Insufficient documentation

## 2015-02-01 DIAGNOSIS — Z3202 Encounter for pregnancy test, result negative: Secondary | ICD-10-CM | POA: Insufficient documentation

## 2015-02-01 DIAGNOSIS — Z7951 Long term (current) use of inhaled steroids: Secondary | ICD-10-CM | POA: Insufficient documentation

## 2015-02-01 DIAGNOSIS — Z8742 Personal history of other diseases of the female genital tract: Secondary | ICD-10-CM | POA: Insufficient documentation

## 2015-02-01 LAB — CBC WITH DIFFERENTIAL/PLATELET
Basophils Absolute: 0 10*3/uL (ref 0.0–0.1)
Basophils Relative: 0 %
EOS PCT: 0 %
Eosinophils Absolute: 0 10*3/uL (ref 0.0–0.7)
HCT: 44.9 % (ref 36.0–46.0)
HEMOGLOBIN: 15.5 g/dL — AB (ref 12.0–15.0)
LYMPHS ABS: 0.8 10*3/uL (ref 0.7–4.0)
LYMPHS PCT: 9 %
MCH: 29.2 pg (ref 26.0–34.0)
MCHC: 34.5 g/dL (ref 30.0–36.0)
MCV: 84.6 fL (ref 78.0–100.0)
Monocytes Absolute: 0.5 10*3/uL (ref 0.1–1.0)
Monocytes Relative: 5 %
Neutro Abs: 8.1 10*3/uL — ABNORMAL HIGH (ref 1.7–7.7)
Neutrophils Relative %: 86 %
PLATELETS: 304 10*3/uL (ref 150–400)
RBC: 5.31 MIL/uL — ABNORMAL HIGH (ref 3.87–5.11)
RDW: 12.4 % (ref 11.5–15.5)
WBC: 9.4 10*3/uL (ref 4.0–10.5)

## 2015-02-01 LAB — URINALYSIS, ROUTINE W REFLEX MICROSCOPIC
Glucose, UA: NEGATIVE mg/dL
Ketones, ur: NEGATIVE mg/dL
Leukocytes, UA: NEGATIVE
NITRITE: NEGATIVE
Protein, ur: NEGATIVE mg/dL
SPECIFIC GRAVITY, URINE: 1.028 (ref 1.005–1.030)
UROBILINOGEN UA: 0.2 mg/dL (ref 0.0–1.0)
pH: 5 (ref 5.0–8.0)

## 2015-02-01 LAB — COMPREHENSIVE METABOLIC PANEL
ALBUMIN: 3.9 g/dL (ref 3.5–5.0)
ALT: 25 U/L (ref 14–54)
AST: 34 U/L (ref 15–41)
Alkaline Phosphatase: 49 U/L (ref 38–126)
Anion gap: 8 (ref 5–15)
BUN: 14 mg/dL (ref 6–20)
CHLORIDE: 104 mmol/L (ref 101–111)
CO2: 23 mmol/L (ref 22–32)
Calcium: 8.4 mg/dL — ABNORMAL LOW (ref 8.9–10.3)
Creatinine, Ser: 0.78 mg/dL (ref 0.44–1.00)
GFR calc Af Amer: >60 mL/min (ref >60)
GFR calc non Af Amer: >60 mL/min (ref >60)
Glucose, Bld: 117 mg/dL — ABNORMAL HIGH (ref 65–99)
POTASSIUM: 3.7 mmol/L (ref 3.5–5.1)
SODIUM: 135 mmol/L (ref 135–145)
Total Bilirubin: 1.2 mg/dL (ref 0.3–1.2)
Total Protein: 8.1 g/dL (ref 6.5–8.1)

## 2015-02-01 LAB — URINE MICROSCOPIC-ADD ON

## 2015-02-01 LAB — LIPASE, BLOOD: Lipase: 24 U/L (ref 11–51)

## 2015-02-01 LAB — POC URINE PREG, ED: Preg Test, Ur: NEGATIVE

## 2015-02-01 LAB — I-STAT CG4 LACTIC ACID, ED
Lactic Acid, Venous: 1.65 mmol/L (ref 0.5–2.0)
Lactic Acid, Venous: 0.6 mmol/L (ref 0.5–2.0)

## 2015-02-01 MED ORDER — SODIUM CHLORIDE 0.9 % IV BOLUS (SEPSIS): 1000.0000 mL | Freq: Once | INTRAVENOUS | Status: DC

## 2015-02-01 MED ORDER — KETOROLAC TROMETHAMINE 30 MG/ML IJ SOLN
30.0000 mg | Freq: Once | INTRAMUSCULAR | Status: AC
  Administered 2015-02-01: 30 mg via INTRAVENOUS
  Filled 2015-02-01: qty 1

## 2015-02-01 MED ORDER — HYDROMORPHONE HCL 1 MG/ML IJ SOLN
1.0000 mg | Freq: Once | INTRAMUSCULAR | Status: AC
  Administered 2015-02-01: 1 mg via INTRAVENOUS
  Filled 2015-02-01: qty 1

## 2015-02-01 MED ORDER — TRAMADOL HCL 50 MG PO TABS: 50.0000 mg | ORAL_TABLET | Freq: Four times a day (QID) | ORAL | Status: DC | PRN

## 2015-02-01 MED ORDER — SODIUM CHLORIDE 0.9 % IV BOLUS (SEPSIS)
2000.0000 mL | Freq: Once | INTRAVENOUS | Status: AC
  Administered 2015-02-01: 2000 mL via INTRAVENOUS

## 2015-02-01 MED ORDER — ONDANSETRON HCL 4 MG/2ML IJ SOLN
4.0000 mg | Freq: Once | INTRAMUSCULAR | Status: AC
  Administered 2015-02-01: 4 mg via INTRAVENOUS
  Filled 2015-02-01: qty 2

## 2015-02-01 MED ORDER — ACETAMINOPHEN 500 MG PO TABS
1000.0000 mg | ORAL_TABLET | Freq: Once | ORAL | Status: AC
  Administered 2015-02-01: 1000 mg via ORAL
  Filled 2015-02-01: qty 2

## 2015-02-01 NOTE — ED Provider Notes (Signed)
CSN: [id number]     Arrival date & time 02/01/15  1403 History   First MD Initiated Contact with Patient 02/01/15 1701     Chief Complaint  Patient presents with  . Generalized Body Aches  . Emesis  . Nausea     (Consider location/radiation/quality/duration/timing/severity/associated sxs/prior Treatment) Patient is a 29 y.o. female presenting with vomiting. The history is provided by the patient (Patient complains of fever nausea and body aches for a couple days.).  Emesis Severity:  Mild Timing:  Intermittent Quality:  Bilious material Able to tolerate:  Liquids Progression:  Improving Associated symptoms: no abdominal pain, no diarrhea and no headaches     Past Medical History  Diagnosis Date  . Abnormal Pap smear of cervix   . Irregular periods   . Allergic rhinitis   . Genital herpes   . Migraine    Past Surgical History  Procedure Laterality Date  . No past surgeries     Family History  Problem Relation Age of Onset  . CAD Neg Hx   . Diabetes Neg Hx   . Cancer      bone marrow, aunt   . Breast cancer Other     great aunt  . Colon cancer Neg Hx   . Hypertension Neg Hx   . Heart disease Neg Hx    Social History  Substance Use Topics  . Smoking status: Never Smoker   . Smokeless tobacco: Never Used  . Alcohol Use: Yes     Comment: socially    OB History    Gravida Para Term Preterm AB TAB SAB Ectopic Multiple Living   1 1 1       1      Review of Systems  Constitutional: Positive for fever. Negative for appetite change and fatigue.  HENT: Negative for congestion, ear discharge and sinus pressure.   Eyes: Negative for discharge.  Respiratory: Negative for cough.   Cardiovascular: Negative for chest pain.  Gastrointestinal: Positive for vomiting. Negative for abdominal pain and diarrhea.  Genitourinary: Negative for frequency and hematuria.  Musculoskeletal: Negative for back pain.  Skin: Negative for rash.  Neurological: Negative for seizures and  headaches.  Psychiatric/Behavioral: Negative for hallucinations.      Allergies  Vicodin  Home Medications   Prior to Admission medications   Medication Sig Start Date End Date Taking? Authorizing Provider  aspirin-acetaminophen-caffeine (EXCEDRIN MIGRAINE) [phone] MG tablet Take 2 tablets by mouth 2 (two) times daily as needed for headache.   Yes Historical Provider, MD  butalbital-acetaminophen-caffeine (FIORICET, ESGIC) 50-325-40 MG per tablet Take 2 tablets by mouth every 6 (six) hours as needed for migraine (migraine).   Yes Historical Provider, MD  fluticasone (FLONASE) 50 MCG/ACT nasal spray Place 2 sprays into both nostrils daily as needed for allergies.    Yes Historical Provider, MD  ibuprofen (ADVIL,MOTRIN) 200 MG tablet Take 800 mg by mouth every 8 (eight) hours as needed for headache or moderate pain.   Yes Historical Provider, MD  valACYclovir (VALTREX) 500 MG tablet Take 500 mg by mouth 2 (two) times daily as needed (for outbreaks).   Yes Historical Provider, MD  busPIRone (BUSPAR) 7.5 MG tablet Take 1 tablet (7.5 mg total) by mouth 3 (three) times daily. Patient not taking: Reported on 07/23/2014 01/08/14   Shelly Bombard, MD  medroxyPROGESTERone (DEPO-PROVERA) 150 MG/ML injection Inject 1 mL (150 mg total) into the muscle every 3 (three) months. Patient not taking: Reported on 07/23/2014 10/07/13   Clenton Pare  Jodi Mourning, MD  traMADol (ULTRAM) 50 MG tablet Take 1 tablet (50 mg total) by mouth every 6 (six) hours as needed for moderate pain. 02/01/15   Milton Ferguson, MD   BP 93/59 mmHg  Pulse 98  Temp(Src) 98.2 F (36.8 C) (Oral)  Resp 16  Ht 5\' 2"  (1.575 m)  Wt 205 lb (92.987 kg)  BMI 37.49 kg/m2  SpO2 96%  LMP 12/14/2014 (Exact Date) Physical Exam  Constitutional: She is oriented to person, place, and time. She appears well-developed.  HENT:  Head: Normocephalic.  Eyes: Conjunctivae and EOM are normal. No scleral icterus.  Neck: Neck supple. No thyromegaly present.   Cardiovascular: Normal rate and regular rhythm.  Exam reveals no gallop and no friction rub.   No murmur heard. Pulmonary/Chest: No stridor. She has no wheezes. She has no rales. She exhibits no tenderness.  Abdominal: She exhibits no distension. There is no tenderness. There is no rebound.  Musculoskeletal: Normal range of motion. She exhibits no edema.  Lymphadenopathy:    She has no cervical adenopathy.  Neurological: She is oriented to person, place, and time. She exhibits normal muscle tone. Coordination normal.  Skin: No rash noted. No erythema.  Psychiatric: She has a normal mood and affect. Her behavior is normal.    ED Course  Procedures (including critical care time) Labs Review Labs Reviewed  COMPREHENSIVE METABOLIC PANEL - Abnormal; Notable for the following:    Glucose, Bld 117 (*)    Calcium 8.4 (*)    All other components within normal limits  CBC WITH DIFFERENTIAL/PLATELET - Abnormal; Notable for the following:    RBC 5.31 (*)    Hemoglobin 15.5 (*)    Neutro Abs 8.1 (*)    All other components within normal limits  URINALYSIS, ROUTINE W REFLEX MICROSCOPIC (NOT AT [facility]) - Abnormal; Notable for the following:    APPearance CLOUDY (*)    Hgb urine dipstick TRACE (*)    Bilirubin Urine SMALL (*)    All other components within normal limits  URINE MICROSCOPIC-ADD ON - Abnormal; Notable for the following:    Squamous Epithelial / LPF FEW (*)    All other components within normal limits  LIPASE, BLOOD  I-STAT CG4 LACTIC ACID, ED  POC URINE PREG, ED  I-STAT CG4 LACTIC ACID, ED  I-STAT CG4 LACTIC ACID, ED    Imaging Review No results found. I have personally reviewed and evaluated these images and lab results as part of my medical decision-making.   EKG Interpretation None      MDM   Final diagnoses:  Viral syndrome    Labs unremarkable. Patient improved with nausea pain medicines and fluids. Suspect viral syndrome patient will follow-up with PCP and  continue hydration    Milton Ferguson, MD 02/01/15 2114

## 2015-02-01 NOTE — ED Notes (Signed)
Delay on lab draw pt states she feels like she is gonna pass out

## 2015-02-01 NOTE — ED Notes (Signed)
Pt reports body aches, vomiting, headache, body aches, and fever since Wednesday. Pain 8/10. Denies dysuria. Denies cough. Denies blood in urine or stool. Denies diarrhea.

## 2015-02-01 NOTE — Progress Notes (Addendum)
EDCM spoke to patient at bedside. Patient confirms she does not have a pcp or insurance living in [place].  [facility] provided patient with contact information to [facility], informed patient of services there.  EDCM also provided patient with list of pcps who accept self pay patients, list of discount pharmacies and websites needymeds.org and GoodRX.com for medication assistance, phone number to inquire about the orange card, phone number to inquire about [employer], phone number to inquire about the [place], financial resources in the community such as local churches, [employer], urban ministries, and dental assistance for uninsured patients.  Patient thankful for resources.  No further EDCM needs at this time.  Patient agreeable for referral to [facility] for orange card.  [facility] referral placed.

## 2015-02-01 NOTE — Discharge Instructions (Signed)
Drink plenty of fluids.  Tylenol for pain.  Follow up with your md if not improving

## 2015-02-16 ENCOUNTER — Emergency Department (HOSPITAL_COMMUNITY)
Admission: EM | Admit: 2015-02-16 | Discharge: 2015-02-16 | Disposition: A | Discharge status: Home / Self Care | Payer: Medicaid Other | Attending: Emergency Medicine | Admitting: Emergency Medicine

## 2015-02-16 ENCOUNTER — Encounter (HOSPITAL_COMMUNITY): Payer: Self-pay

## 2015-02-16 DIAGNOSIS — L0231 Cutaneous abscess of buttock: Secondary | ICD-10-CM

## 2015-02-16 DIAGNOSIS — G43909 Migraine, unspecified, not intractable, without status migrainosus: Secondary | ICD-10-CM | POA: Insufficient documentation

## 2015-02-16 DIAGNOSIS — Z8619 Personal history of other infectious and parasitic diseases: Secondary | ICD-10-CM | POA: Insufficient documentation

## 2015-02-16 MED ORDER — OXYCODONE-ACETAMINOPHEN 5-325 MG PO TABS
1.0000 | ORAL_TABLET | ORAL | Status: DC | PRN
Start: 2015-02-16 — End: 2015-11-16

## 2015-02-16 MED ORDER — SULFAMETHOXAZOLE-TRIMETHOPRIM 800-160 MG PO TABS: 1.0000 | ORAL_TABLET | Freq: Two times a day (BID) | ORAL | Status: AC

## 2015-02-16 MED ORDER — LIDOCAINE-EPINEPHRINE (PF) 2 %-1:200000 IJ SOLN
INTRAMUSCULAR | Status: AC
  Administered 2015-02-16: 10 mL
  Filled 2015-02-16: qty 20

## 2015-02-16 NOTE — ED Notes (Signed)
MD at bedside. 

## 2015-02-16 NOTE — Discharge Instructions (Signed)
Abscess °An abscess is an infected area that contains a collection of pus and debris. It can occur in almost any part of the body. An abscess is also known as a furuncle or boil. °CAUSES  °An abscess occurs when tissue gets infected. This can occur from blockage of oil or sweat glands, infection of hair follicles, or a minor injury to the skin. As the body tries to fight the infection, pus collects in the area and creates pressure under the skin. This pressure causes pain. People with weakened immune systems have difficulty fighting infections and get certain abscesses more often.  °SYMPTOMS °Usually an abscess develops on the skin and becomes a painful mass that is red, warm, and tender. If the abscess forms under the skin, you may feel a moveable soft area under the skin. Some abscesses break open (rupture) on their own, but most will continue to get worse without care. The infection can spread deeper into the body and eventually into the bloodstream, causing you to feel ill.  °DIAGNOSIS  °Your caregiver will take your medical history and perform a physical exam. A sample of fluid may also be taken from the abscess to determine what is causing your infection. °TREATMENT  °Your caregiver may prescribe antibiotic medicines to fight the infection. However, taking antibiotics alone usually does not cure an abscess. Your caregiver may need to make a small cut (incision) in the abscess to drain the pus. In some cases, gauze is packed into the abscess to reduce pain and to continue draining the area. °HOME CARE INSTRUCTIONS  °· Only take over-the-counter or prescription medicines for pain, discomfort, or fever as directed by your caregiver. °· If you were prescribed antibiotics, take them as directed. Finish them even if you start to feel better. °· If gauze is used, follow your caregiver's directions for changing the gauze. °· To avoid spreading the infection: °· Keep your draining abscess covered with a  bandage. °· Wash your hands well. °· Do not share personal care items, towels, or whirlpools with others. °· Avoid skin contact with others. °· Keep your skin and clothes clean around the abscess. °· Keep all follow-up appointments as directed by your caregiver. °SEEK MEDICAL CARE IF:  °· You have increased pain, swelling, redness, fluid drainage, or bleeding. °· You have muscle aches, chills, or a general ill feeling. °· You have a fever. °MAKE SURE YOU:  °· Understand these instructions. °· Will watch your condition. °· Will get help right away if you are not doing well or get worse. °  °This information is not intended to replace advice given to you by your health care provider. Make sure you discuss any questions you have with your health care provider. °  °Document Released: 12/28/2004 Document Revised: 09/19/2011 Document Reviewed: 06/02/2011 °Elsevier Interactive Patient Education ©2016 Elsevier Inc. ° °Incision and Drainage °Incision and drainage is a procedure in which a sac-like structure (cystic structure) is opened and drained. The area to be drained usually contains material such as pus, fluid, or blood.  °LET YOUR CAREGIVER KNOW ABOUT:  °· Allergies to medicine. °· Medicines taken, including vitamins, herbs, eyedrops, over-the-counter medicines, and creams. °· Use of steroids (by mouth or creams). °· Previous problems with anesthetics or numbing medicines. °· History of bleeding problems or blood clots. °· Previous surgery. °· Other health problems, including diabetes and kidney problems. °· Possibility of pregnancy, if this applies. °RISKS AND COMPLICATIONS °· Pain. °· Bleeding. °· Scarring. °· Infection. °BEFORE THE PROCEDURE  °  You may need to have an ultrasound or other imaging tests to see how large or deep your cystic structure is. Blood tests may also be used to determine if you have an infection or how severe the infection is. You may need to have a tetanus shot. °PROCEDURE  °The affected area  is cleaned with a cleaning fluid. The cyst area will then be numbed with a medicine (local anesthetic). A small incision will be made in the cystic structure. A syringe or catheter may be used to drain the contents of the cystic structure, or the contents may be squeezed out. The area will then be flushed with a cleansing solution. After cleansing the area, it is often gently packed with a gauze or another wound dressing. Once it is packed, it will be covered with gauze and tape or some other type of wound dressing.  °AFTER THE PROCEDURE  °· Often, you will be allowed to go home right after the procedure. °· You may be given antibiotic medicine to prevent or heal an infection. °· If the area was packed with gauze or some other wound dressing, you will likely need to come back in 1 to 2 days to get it removed. °· The area should heal in about 14 days. °  °This information is not intended to replace advice given to you by your health care provider. Make sure you discuss any questions you have with your health care provider. °  °Document Released: 09/13/2000 Document Revised: 09/19/2011 Document Reviewed: 05/15/2011 °Elsevier Interactive Patient Education ©2016 Elsevier Inc. ° °

## 2015-02-16 NOTE — ED Provider Notes (Signed)
CSN: [id number]     Arrival date & time 02/16/15  [id number] History   First MD Initiated Contact with Patient 02/16/15 0940     Chief Complaint  Patient presents with  . Abscess     (Consider location/radiation/quality/duration/timing/severity/associated sxs/prior Treatment) HPI Comments: Patient here complaining of pain to the left buttock cheek. Characterized as sharp and worse with sitting. Denies any fever or drainage. No prior history of same. No treatment used prior to arrival  Patient is a 29 y.o. female presenting with abscess. The history is provided by the patient.  Abscess   Past Medical History  Diagnosis Date  . Abnormal Pap smear of cervix   . Irregular periods   . Allergic rhinitis   . Genital herpes   . Migraine    Past Surgical History  Procedure Laterality Date  . No past surgeries     Family History  Problem Relation Age of Onset  . CAD Neg Hx   . Diabetes Neg Hx   . Cancer      bone marrow, aunt   . Breast cancer Other     great aunt  . Colon cancer Neg Hx   . Hypertension Neg Hx   . Heart disease Neg Hx    Social History  Substance Use Topics  . Smoking status: Never Smoker   . Smokeless tobacco: Never Used  . Alcohol Use: Yes     Comment: socially    OB History    Gravida Para Term Preterm AB TAB SAB Ectopic Multiple Living   1 1 1       1      Review of Systems  All other systems reviewed and are negative.     Allergies  Vicodin  Home Medications   Prior to Admission medications   Medication Sig Start Date End Date Taking? Authorizing Provider  aspirin-acetaminophen-caffeine (EXCEDRIN MIGRAINE) [phone] MG tablet Take 2 tablets by mouth 2 (two) times daily as needed for headache.    Historical Provider, MD  busPIRone (BUSPAR) 7.5 MG tablet Take 1 tablet (7.5 mg total) by mouth 3 (three) times daily. Patient not taking: Reported on 07/23/2014 01/08/14   Shelly Bombard, MD  butalbital-acetaminophen-caffeine (FIORICET, ESGIC)  [phone] MG per tablet Take 2 tablets by mouth every 6 (six) hours as needed for migraine (migraine).    Historical Provider, MD  fluticasone (FLONASE) 50 MCG/ACT nasal spray Place 2 sprays into both nostrils daily as needed for allergies.     Historical Provider, MD  ibuprofen (ADVIL,MOTRIN) 200 MG tablet Take 800 mg by mouth every 8 (eight) hours as needed for headache or moderate pain.    Historical Provider, MD  medroxyPROGESTERone (DEPO-PROVERA) 150 MG/ML injection Inject 1 mL (150 mg total) into the muscle every 3 (three) months. Patient not taking: Reported on 07/23/2014 10/07/13   Shelly Bombard, MD  traMADol (ULTRAM) 50 MG tablet Take 1 tablet (50 mg total) by mouth every 6 (six) hours as needed for moderate pain. 02/01/15   Milton Ferguson, MD  valACYclovir (VALTREX) 500 MG tablet Take 500 mg by mouth 2 (two) times daily as needed (for outbreaks).    Historical Provider, MD   BP 115/65 mmHg  Pulse 94  Temp(Src) 98.1 F (36.7 C) (Oral)  Resp 18  Ht 5\' 2"  (1.575 m)  Wt 205 lb (92.987 kg)  BMI 37.49 kg/m2  SpO2 100%  LMP 12/14/2014 Physical Exam  Constitutional: She is oriented to person, place, and time. She appears well-developed and  well-nourished.  Non-toxic appearance. No distress.  HENT:  Head: Normocephalic and atraumatic.  Eyes: Conjunctivae, EOM and lids are normal. Pupils are equal, round, and reactive to light.  Neck: Normal range of motion. Neck supple. No tracheal deviation present. No thyroid mass present.  Cardiovascular: Normal rate, regular rhythm and normal heart sounds.  Exam reveals no gallop.   No murmur heard. Pulmonary/Chest: Effort normal and breath sounds normal. No stridor. No respiratory distress. She has no decreased breath sounds. She has no wheezes. She has no rhonchi. She has no rales.  Abdominal: Soft. Normal appearance and bowel sounds are normal. She exhibits no distension. There is no tenderness. There is no rebound and no CVA tenderness.    Musculoskeletal: Normal range of motion. She exhibits no edema or tenderness.       Back:  Neurological: She is alert and oriented to person, place, and time. She has normal strength. No cranial nerve deficit or sensory deficit. GCS eye subscore is 4. GCS verbal subscore is 5. GCS motor subscore is 6.  Skin: Skin is warm and dry. No abrasion and no rash noted.  Psychiatric: She has a normal mood and affect. Her speech is normal and behavior is normal.  Nursing note and vitals reviewed.   ED Course  Procedures (including critical care time) Labs Review Labs Reviewed - No data to display  Imaging Review No results found. I have personally reviewed and evaluated these images and lab results as part of my medical decision-making.   EKG Interpretation None      MDM   Final diagnoses:  None    INCISION AND DRAINAGE Performed by: Leota Jacobsen Consent: Verbal consent obtained. Risks and benefits: risks, benefits and alternatives were discussed Time out performed prior to procedure Type: abscess Body area: Left gluteal fold Anesthesia: local infiltration Incision was made with a scalpel. Local anesthetic: lidocaine 2% .5 epinephrine Anesthetic total: 10 ml Complexity: complex Blunt dissection to break up loculations Drainage: purulent Drainage amount: mild Packing material: none Patient tolerance: Patient tolerated the procedure well with no immediate complications.  Patient to be placed on antibiotics given pain meds    Lacretia Leigh, MD 02/16/15 1012

## 2015-02-16 NOTE — ED Notes (Signed)
Patient states she has a painful area near her rectum. Patient states she is not sure if it is a hemorrhoid or an abscess. Patient states she has had increased redness and pain in the past few days

## 2015-05-26 ENCOUNTER — Encounter (HOSPITAL_COMMUNITY): Payer: Self-pay

## 2015-05-26 ENCOUNTER — Emergency Department (HOSPITAL_COMMUNITY)
Admission: EM | Admit: 2015-05-26 | Discharge: 2015-05-26 | Disposition: A | Discharge status: Home / Self Care | Payer: Medicaid Other | Attending: Emergency Medicine | Admitting: Emergency Medicine

## 2015-05-26 DIAGNOSIS — R1084 Generalized abdominal pain: Secondary | ICD-10-CM | POA: Insufficient documentation

## 2015-05-26 DIAGNOSIS — Z8619 Personal history of other infectious and parasitic diseases: Secondary | ICD-10-CM | POA: Insufficient documentation

## 2015-05-26 DIAGNOSIS — G43909 Migraine, unspecified, not intractable, without status migrainosus: Secondary | ICD-10-CM | POA: Insufficient documentation

## 2015-05-26 DIAGNOSIS — L0291 Cutaneous abscess, unspecified: Secondary | ICD-10-CM

## 2015-05-26 DIAGNOSIS — Z3202 Encounter for pregnancy test, result negative: Secondary | ICD-10-CM | POA: Diagnosis not present

## 2015-05-26 DIAGNOSIS — L0231 Cutaneous abscess of buttock: Secondary | ICD-10-CM | POA: Diagnosis present

## 2015-05-26 DIAGNOSIS — Z8742 Personal history of other diseases of the female genital tract: Secondary | ICD-10-CM | POA: Insufficient documentation

## 2015-05-26 DIAGNOSIS — R110 Nausea: Secondary | ICD-10-CM | POA: Diagnosis not present

## 2015-05-26 LAB — COMPREHENSIVE METABOLIC PANEL
ALK PHOS: 43 U/L (ref 38–126)
ALT: 37 U/L (ref 14–54)
ANION GAP: 9 (ref 5–15)
AST: 38 U/L (ref 15–41)
Albumin: 3.9 g/dL (ref 3.5–5.0)
BILIRUBIN TOTAL: 0.7 mg/dL (ref 0.3–1.2)
BUN: 12 mg/dL (ref 6–20)
CALCIUM: 8.9 mg/dL (ref 8.9–10.3)
CO2: 23 mmol/L (ref 22–32)
Chloride: 106 mmol/L (ref 101–111)
Creatinine, Ser: 0.71 mg/dL (ref 0.44–1.00)
Glucose, Bld: 123 mg/dL — ABNORMAL HIGH (ref 65–99)
Potassium: 3.7 mmol/L (ref 3.5–5.1)
Sodium: 138 mmol/L (ref 135–145)
TOTAL PROTEIN: 7.2 g/dL (ref 6.5–8.1)

## 2015-05-26 LAB — POC URINE PREG, ED: Preg Test, Ur: NEGATIVE

## 2015-05-26 LAB — CBC WITH DIFFERENTIAL/PLATELET
Basophils Absolute: 0 10*3/uL (ref 0.0–0.1)
Basophils Relative: 0 %
Eosinophils Absolute: 0.2 10*3/uL (ref 0.0–0.7)
Eosinophils Relative: 3 %
HEMATOCRIT: 40.7 % (ref 36.0–46.0)
HEMOGLOBIN: 13.6 g/dL (ref 12.0–15.0)
LYMPHS ABS: 3.6 10*3/uL (ref 0.7–4.0)
Lymphocytes Relative: 41 %
MCH: 28.6 pg (ref 26.0–34.0)
MCHC: 33.4 g/dL (ref 30.0–36.0)
MCV: 85.7 fL (ref 78.0–100.0)
MONO ABS: 0.4 10*3/uL (ref 0.1–1.0)
MONOS PCT: 5 %
NEUTROS ABS: 4.4 10*3/uL (ref 1.7–7.7)
NEUTROS PCT: 51 %
Platelets: 288 10*3/uL (ref 150–400)
RBC: 4.75 MIL/uL (ref 3.87–5.11)
RDW: 12.3 % (ref 11.5–15.5)
WBC: 8.6 10*3/uL (ref 4.0–10.5)

## 2015-05-26 LAB — URINALYSIS, ROUTINE W REFLEX MICROSCOPIC
Bilirubin Urine: NEGATIVE
GLUCOSE, UA: NEGATIVE mg/dL
HGB URINE DIPSTICK: NEGATIVE
KETONES UR: NEGATIVE mg/dL
LEUKOCYTES UA: NEGATIVE
Nitrite: NEGATIVE
PH: 5.5 (ref 5.0–8.0)
Protein, ur: NEGATIVE mg/dL
Specific Gravity, Urine: 1.019 (ref 1.005–1.030)

## 2015-05-26 LAB — LIPASE, BLOOD: LIPASE: 28 U/L (ref 11–51)

## 2015-05-26 MED ORDER — LIDOCAINE HCL 1 % IJ SOLN
20.0000 mL | Freq: Once | INTRAMUSCULAR | Status: AC
  Administered 2015-05-26: 20 mL via INTRADERMAL
  Filled 2015-05-26: qty 20

## 2015-05-26 MED ORDER — SULFAMETHOXAZOLE-TRIMETHOPRIM 800-160 MG PO TABS: 1.0000 | ORAL_TABLET | Freq: Two times a day (BID) | ORAL | Status: AC

## 2015-05-26 NOTE — Discharge Instructions (Signed)
1. Medications: bactrim, usual home medications 2. Treatment: rest, drink plenty of fluids, use warm compresses 3. Follow Up: please followup with your primary doctor in 2 days for wound recheck and for discussion of your diagnoses and further evaluation after today's visit; if you do not have a primary care doctor use the resource guide provided to find one; please return to the ER for high fever, increased pain, new or worsening symptoms   Abscess An abscess is an infected area that contains a collection of pus and debris.It can occur in almost any part of the body. An abscess is also known as a furuncle or boil. CAUSES  An abscess occurs when tissue gets infected. This can occur from blockage of oil or sweat glands, infection of hair follicles, or a minor injury to the skin. As the body tries to fight the infection, pus collects in the area and creates pressure under the skin. This pressure causes pain. People with weakened immune systems have difficulty fighting infections and get certain abscesses more often.  SYMPTOMS Usually an abscess develops on the skin and becomes a painful mass that is red, warm, and tender. If the abscess forms under the skin, you may feel a moveable soft area under the skin. Some abscesses break open (rupture) on their own, but most will continue to get worse without care. The infection can spread deeper into the body and eventually into the bloodstream, causing you to feel ill.  DIAGNOSIS  Your caregiver will take your medical history and perform a physical exam. A sample of fluid may also be taken from the abscess to determine what is causing your infection. TREATMENT  Your caregiver may prescribe antibiotic medicines to fight the infection. However, taking antibiotics alone usually does not cure an abscess. Your caregiver may need to make a small cut (incision) in the abscess to drain the pus. In some cases, gauze is packed into the abscess to reduce pain and to  continue draining the area. HOME CARE INSTRUCTIONS   Only take over-the-counter or prescription medicines for pain, discomfort, or fever as directed by your caregiver.  If you were prescribed antibiotics, take them as directed. Finish them even if you start to feel better.  If gauze is used, follow your caregiver's directions for changing the gauze.  To avoid spreading the infection:  Keep your draining abscess covered with a bandage.  Wash your hands well.  Do not share personal care items, towels, or whirlpools with others.  Avoid skin contact with others.  Keep your skin and clothes clean around the abscess.  Keep all follow-up appointments as directed by your caregiver. SEEK MEDICAL CARE IF:   You have increased pain, swelling, redness, fluid drainage, or bleeding.  You have muscle aches, chills, or a general ill feeling.  You have a fever. MAKE SURE YOU:   Understand these instructions.  Will watch your condition.  Will get help right away if you are not doing well or get worse.   This information is not intended to replace advice given to you by your health care provider. Make sure you discuss any questions you have with your health care provider.   Document Released: 12/28/2004 Document Revised: 09/19/2011 Document Reviewed: 06/02/2011 Elsevier Interactive Patient Education 2016 Elsevier Inc.  Incision and Drainage Incision and drainage is a procedure in which a sac-like structure (cystic structure) is opened and drained. The area to be drained usually contains material such as pus, fluid, or blood.  LET YOUR CAREGIVER  KNOW ABOUT:   Allergies to medicine.  Medicines taken, including vitamins, herbs, eyedrops, over-the-counter medicines, and creams.  Use of steroids (by mouth or creams).  Previous problems with anesthetics or numbing medicines.  History of bleeding problems or blood clots.  Previous surgery.  Other health problems, including diabetes  and kidney problems.  Possibility of pregnancy, if this applies. RISKS AND COMPLICATIONS  Pain.  Bleeding.  Scarring.  Infection. BEFORE THE PROCEDURE  You may need to have an ultrasound or other imaging tests to see how large or deep your cystic structure is. Blood tests may also be used to determine if you have an infection or how severe the infection is. You may need to have a tetanus shot. PROCEDURE  The affected area is cleaned with a cleaning fluid. The cyst area will then be numbed with a medicine (local anesthetic). A small incision will be made in the cystic structure. A syringe or catheter may be used to drain the contents of the cystic structure, or the contents may be squeezed out. The area will then be flushed with a cleansing solution. After cleansing the area, it is often gently packed with a gauze or another wound dressing. Once it is packed, it will be covered with gauze and tape or some other type of wound dressing. AFTER THE PROCEDURE   Often, you will be allowed to go home right after the procedure.  You may be given antibiotic medicine to prevent or heal an infection.  If the area was packed with gauze or some other wound dressing, you will likely need to come back in 1 to 2 days to get it removed.  The area should heal in about 14 days.   This information is not intended to replace advice given to you by your health care provider. Make sure you discuss any questions you have with your health care provider.   Document Released: 09/13/2000 Document Revised: 09/19/2011 Document Reviewed: 05/15/2011 Elsevier Interactive Patient Education [employer].

## 2015-05-26 NOTE — ED Notes (Signed)
Pt has abscess to buttocks, was drained prior to this visit, pt states its not healing properly.

## 2015-05-26 NOTE — ED Provider Notes (Signed)
CSN: [id number]     Arrival date & time 05/26/15  1108 History   First MD Initiated Contact with Patient 05/26/15 1622     Chief Complaint  Patient presents with  . Abscess    HPI   [name] [name] is a 30 y.o. female with a PMH of migraines who presents to the ED with abscess to her left buttock. She states she had this drained in November, but has had irritation and clear drainage since that time. She states she was prescribed an antibiotic course, which she finished. She also reports lower abdominal pain x 1 week as well as nausea. She denies exacerbating or alleviating factors. She denies fever, chills, vomiting, diarrhea, constipation, dysuria, urgency, frequency, vaginal discharge.   Past Medical History  Diagnosis Date  . Abnormal Pap smear of cervix   . Irregular periods   . Allergic rhinitis   . Genital herpes   . Migraine    Past Surgical History  Procedure Laterality Date  . No past surgeries     Family History  Problem Relation Age of Onset  . CAD Neg Hx   . Diabetes Neg Hx   . Cancer      bone marrow, aunt   . Breast cancer Other     great aunt  . Colon cancer Neg Hx   . Hypertension Neg Hx   . Heart disease Neg Hx    Social History  Substance Use Topics  . Smoking status: Never Smoker   . Smokeless tobacco: Never Used  . Alcohol Use: Yes     Comment: socially    OB History    Gravida Para Term Preterm AB TAB SAB Ectopic Multiple Living   1 1 1       1        Review of Systems  Constitutional: Negative for fever and chills.  Gastrointestinal: Positive for nausea and abdominal pain. Negative for vomiting, diarrhea, constipation and blood in stool.  Genitourinary: Negative for dysuria, urgency, frequency and vaginal discharge.  Skin: Positive for wound.  All other systems reviewed and are negative.     Allergies  Vicodin  Home Medications   Prior to Admission medications   Medication Sig Start Date End Date Taking? Authorizing Provider   aspirin-acetaminophen-caffeine (EXCEDRIN MIGRAINE) [phone] MG tablet Take 2 tablets by mouth 2 (two) times daily as needed for headache.   Yes Historical Provider, MD  ibuprofen (ADVIL,MOTRIN) 200 MG tablet Take 800 mg by mouth every 8 (eight) hours as needed for headache or moderate pain.   Yes Historical Provider, MD  valACYclovir (VALTREX) 500 MG tablet Take 500 mg by mouth 2 (two) times daily as needed (for outbreaks).   Yes Historical Provider, MD  busPIRone (BUSPAR) 7.5 MG tablet Take 1 tablet (7.5 mg total) by mouth 3 (three) times daily. Patient not taking: Reported on 07/23/2014 01/08/14   Shelly Bombard, MD  medroxyPROGESTERone (DEPO-PROVERA) 150 MG/ML injection Inject 1 mL (150 mg total) into the muscle every 3 (three) months. Patient not taking: Reported on 07/23/2014 10/07/13   Shelly Bombard, MD  oxyCODONE-acetaminophen (PERCOCET/ROXICET) 5-325 MG tablet Take 1-2 tablets by mouth every 4 (four) hours as needed for severe pain. Patient not taking: Reported on 05/26/2015 02/16/15   Lacretia Leigh, MD  sulfamethoxazole-trimethoprim (BACTRIM DS,SEPTRA DS) 800-160 MG tablet Take 1 tablet by mouth 2 (two) times daily. 05/26/15 06/02/15  Marella Chimes, PA-C  traMADol (ULTRAM) 50 MG tablet Take 1 tablet (50 mg total) by  mouth every 6 (six) hours as needed for moderate pain. Patient not taking: Reported on 05/26/2015 02/01/15   Milton Ferguson, MD    BP 130/77 mmHg  Pulse 105  Temp(Src) 98.2 F (36.8 C) (Oral)  Resp 18  SpO2 98%  LMP 05/05/2015 Physical Exam  Constitutional: She is oriented to person, place, and time. She appears well-developed and well-nourished. No distress.  HENT:  Head: Normocephalic and atraumatic.  Right Ear: External ear normal.  Left Ear: External ear normal.  Nose: Nose normal.  Mouth/Throat: Uvula is midline, oropharynx is clear and moist and mucous membranes are normal.  Eyes: Conjunctivae, EOM and lids are normal. Pupils are equal, round, and reactive  to light. Right eye exhibits no discharge. Left eye exhibits no discharge. No scleral icterus.  Neck: Normal range of motion. Neck supple.  Cardiovascular: Normal rate, regular rhythm, normal heart sounds, intact distal pulses and normal pulses.   Pulmonary/Chest: Effort normal and breath sounds normal. No respiratory distress. She has no wheezes. She has no rales.  Abdominal: Soft. Normal appearance and bowel sounds are normal. She exhibits no distension and no mass. There is tenderness. There is no rigidity, no rebound and no guarding.  Mild diffuse TTP in lower abdomen. No rebound, guarding, or masses.  Musculoskeletal: Normal range of motion. She exhibits no edema or tenderness.  Neurological: She is alert and oriented to person, place, and time. She has normal strength. No cranial nerve deficit or sensory deficit.  Skin: Skin is warm, dry and intact. No rash noted. She is not diaphoretic. No erythema. No pallor.  1 cm abscess to left buttock with 1 cm area of surrounding induration.  Psychiatric: She has a normal mood and affect. Her speech is normal and behavior is normal.  Nursing note and vitals reviewed.   ED Course  .Marland KitchenIncision and Drainage Date/Time: 05/26/2015 6:25 PM Performed by: Bernerd Limbo C Authorized by: Bernerd Limbo C Consent: Verbal consent obtained. Risks and benefits: risks, benefits and alternatives were discussed Consent given by: patient Patient understanding: patient states understanding of the procedure being performed Patient consent: the patient's understanding of the procedure matches consent given Procedure consent: procedure consent matches procedure scheduled Relevant documents: relevant documents present and verified Site marked: the operative site was marked Required items: required blood products, implants, devices, and special equipment available Patient identity confirmed: verbally with patient Time out: Immediately prior to procedure a  "time out" was called to verify the correct patient, procedure, equipment, support staff and site/side marked as required. Type: abscess Body area: anogenital (left buttock) Anesthesia: local infiltration Local anesthetic: lidocaine 1% without epinephrine Anesthetic total: 3 ml Patient sedated: no Scalpel size: 11 Needle gauge: 22 Incision type: single straight Incision depth: dermal Complexity: complex Drainage: purulent Drainage amount: scant Wound treatment: wound left open Packing material: none Patient tolerance: Patient tolerated the procedure well with no immediate complications    Labs Review Labs Reviewed  URINALYSIS, ROUTINE W REFLEX MICROSCOPIC (NOT AT [facility]) - Abnormal; Notable for the following:    APPearance CLOUDY (*)    All other components within normal limits  COMPREHENSIVE METABOLIC PANEL - Abnormal; Notable for the following:    Glucose, Bld 123 (*)    All other components within normal limits  CBC WITH DIFFERENTIAL/PLATELET  LIPASE, BLOOD  POC URINE PREG, ED    Imaging Review No results found.   I have personally reviewed and evaluated these lab results as part of my medical decision-making.   EKG Interpretation None  MDM   Final diagnoses:  Abscess    30 year old female presents with abscess to her left buttock. States the site never healed s/p I&D in November and has been draining clear fluid. Also noted mild lower abdominal pain.  Patient is afebrile. Vital signs stable. Heart RRR. Lungs clear bilaterally. Abdomen soft, non-distended, with minimal TTP in lower quadrants. No rebound, guarding, or masses. 1 cm area of fluctuance to left buttock with 1 cm area of induration.   CBC negative for leukocytosis or anemia. CMP unremarkable. Lipase within normal limits. UA negative for infection. Urine pregnancy negative.  Abscess drained, which the patient tolerated well. Will place on bactrim given mild induration.  Patient is non-toxic and  well-appearing, feel she is stable for discharge at this time. Patient to follow-up in 2 days for wound check. Return precautions discussed. Patient verbalizes her understanding and is in agreement with plan.  BP 130/77 mmHg  Pulse 105  Temp(Src) 98.2 F (36.8 C) (Oral)  Resp 18  SpO2 98%  LMP 05/05/2015     Marella Chimes, PA-C 05/27/15 [id number]  Quintella Reichert, MD 05/28/15 [phone]

## 2015-05-26 NOTE — ED Notes (Signed)
Pt states abscess drained in November. Wound is not healing.  Has whole in site with drainage.  Some abdominal pain.  Concerned for "fistula"

## 2015-11-16 ENCOUNTER — Ambulatory Visit (INDEPENDENT_AMBULATORY_CARE_PROVIDER_SITE_OTHER): Payer: Medicaid Other | Admitting: Obstetrics

## 2015-11-16 ENCOUNTER — Encounter: Payer: Self-pay | Admitting: Obstetrics

## 2015-11-16 VITALS — BP 116/78 | HR 73 | Temp 98.3°F | Ht 61.0 in | Wt 225.0 lb

## 2015-11-16 DIAGNOSIS — Z124 Encounter for screening for malignant neoplasm of cervix: Secondary | ICD-10-CM

## 2015-11-16 DIAGNOSIS — Z3049 Encounter for surveillance of other contraceptives: Secondary | ICD-10-CM

## 2015-11-16 DIAGNOSIS — Z01419 Encounter for gynecological examination (general) (routine) without abnormal findings: Secondary | ICD-10-CM

## 2015-11-16 DIAGNOSIS — Z30013 Encounter for initial prescription of injectable contraceptive: Secondary | ICD-10-CM

## 2015-11-16 LAB — POCT URINE PREGNANCY: Preg Test, Ur: NEGATIVE

## 2015-11-16 MED ORDER — MEDROXYPROGESTERONE ACETATE 150 MG/ML IM SUSP
150.0000 mg | Freq: Once | INTRAMUSCULAR | Status: AC
  Administered 2015-11-16: 150 mg via INTRAMUSCULAR

## 2015-11-16 MED ORDER — VITAFOL-NANO 18-0.6-0.4 MG PO TABS: 1.0000 | ORAL_TABLET | Freq: Every day | ORAL | 11 refills | Status: DC

## 2015-11-16 MED ORDER — MEDROXYPROGESTERONE ACETATE 150 MG/ML IM SUSP: 150.0000 mg | INTRAMUSCULAR | 3 refills | Status: DC

## 2015-11-16 NOTE — Progress Notes (Signed)
Subjective:        [name] [name] is a 30 y.o. female here for a routine exam.  Current complaints: None.    Personal health questionnaire:  Is patient [name], have a family history of breast and/or ovarian cancer: no Is there a family history of uterine cancer diagnosed at age < 32, gastrointestinal cancer, urinary tract cancer, family member who is a [name]: no Is the patient overweight and hypertensive, family history of diabetes, personal history of gestational diabetes, preeclampsia or PCOS: no Is patient over 10, have PCOS,  family history of premature CHD under age 55, diabetes, smoke, have hypertension or peripheral artery disease:  no At any time, has a partner hit, kicked or otherwise hurt or frightened you?: no Over the past 2 weeks, have you felt down, depressed or hopeless?: no Over the past 2 weeks, have you felt little interest or pleasure in doing things?:no   Gynecologic History Patient's last menstrual period was 10/23/2015 (approximate). Contraception: condoms Last Pap: 2015. Results were: normal Last mammogram: n/a. Results were: n/a  Obstetric History OB History  Gravida Para Term Preterm AB Living  1 1 1     1   SAB TAB Ectopic Multiple Live Births          1    # Outcome Date GA Lbr Len/2nd Weight Sex Delivery Anes PTL Lv  1 Term 08/16/13 [redacted]w[redacted]d 23:40 / 00:38 6 lb 14 oz (3.118 kg) M Vag-Spont EPI  LIV      Past Medical History:  Diagnosis Date  . Abnormal Pap smear of cervix   . Allergic rhinitis   . Genital herpes   . Irregular periods   . Migraine     Past Surgical History:  Procedure Laterality Date  . INCISE AND DRAIN ABCESS    . NO PAST SURGERIES       Current Outpatient Prescriptions:  .  valACYclovir (VALTREX) 500 MG tablet, Take 500 mg by mouth 2 (two) times daily as needed (for outbreaks)., Disp: , Rfl:  .  medroxyPROGESTERone (DEPO-PROVERA) 150 MG/ML injection, Inject 1 mL (150 mg total) into the  muscle every 3 (three) months., Disp: 1 mL, Rfl: 3 .  Prenatal-Fe Fum-Methf-FA w/o A (VITAFOL-NANO) 18-0.6-0.4 MG TABS, Take 1 tablet by mouth daily before breakfast., Disp: 30 tablet, Rfl: 11 Allergies  Allergen Reactions  . Vicodin [Hydrocodone-Acetaminophen]     GI s/e not true allergies     Social History  Substance Use Topics  . Smoking status: Never Smoker  . Smokeless tobacco: Never Used  . Alcohol use Yes     Comment: socially     Family History  Problem Relation Age of Onset  . Cancer      bone marrow, aunt   . Breast cancer Other     great aunt  . CAD Neg Hx   . Diabetes Neg Hx   . Colon cancer Neg Hx   . Hypertension Neg Hx   . Heart disease Neg Hx       Review of Systems  Constitutional: negative for fatigue and weight loss Respiratory: negative for cough and wheezing Cardiovascular: negative for chest pain, fatigue and palpitations Gastrointestinal: negative for abdominal pain and change in bowel habits Musculoskeletal:negative for myalgias Neurological: negative for gait problems and tremors Behavioral/Psych: negative for abusive relationship, depression Endocrine: negative for temperature intolerance   Genitourinary:negative for abnormal menstrual periods, genital lesions, hot flashes, sexual problems and vaginal discharge Integument/breast: negative for breast  lump, breast tenderness, nipple discharge and skin lesion(s)    Objective:       BP 116/78   Pulse 73   Temp 98.3 F (36.8 C)   Ht 5\' 1"  (1.549 m)   Wt 225 lb (102.1 kg)   LMP 10/23/2015 (Approximate)   BMI 42.51 kg/m  General:   alert  Skin:   no rash or abnormalities  Lungs:   clear to auscultation bilaterally  Heart:   regular rate and rhythm, S1, S2 normal, no murmur, click, rub or gallop  Breasts:   normal without suspicious masses, skin or nipple changes or axillary nodes  Abdomen:  normal findings: no organomegaly, soft, non-tender and no hernia  Pelvis:  External genitalia:  normal general appearance Urinary system: urethral meatus normal and bladder without fullness, nontender Vaginal: normal without tenderness, induration or masses Cervix: normal appearance Adnexa: normal bimanual exam Uterus: anteverted and non-tender, normal size   Lab Review Urine pregnancy test Labs reviewed yes Radiologic studies reviewed no    Assessment:    Healthy female exam.    Contraceptive counseling and advice   Plan:    Education reviewed: calcium supplements, low fat, low cholesterol diet, safe sex/STD prevention, self breast exams and weight bearing exercise. Contraception: Depo-Provera injections. Follow up in: 1 year.   Meds ordered this encounter  Medications  . medroxyPROGESTERone (DEPO-PROVERA) 150 MG/ML injection    Sig: Inject 1 mL (150 mg total) into the muscle every 3 (three) months.    Dispense:  1 mL    Refill:  3  . Prenatal-Fe Fum-Methf-FA w/o A (VITAFOL-NANO) 18-0.6-0.4 MG TABS    Sig: Take 1 tablet by mouth daily before breakfast.    Dispense:  30 tablet    Refill:  11   No orders of the defined types were placed in this encounter.    Patient ID: [name] [name], female   DOB: 1985-05-09, 30 y.o.   MRN: [id number]

## 2015-11-16 NOTE — Addendum Note (Signed)
Addended by: Dorothyann Gibbs on: 11/16/2015 04:12 PM   Modules accepted: Orders

## 2015-11-19 LAB — PAP IG AND HPV HIGH-RISK
HPV, high-risk: NEGATIVE
PAP SMEAR COMMENT: 0

## 2015-11-19 LAB — NUSWAB VAGINITIS (VG)
ATOPOBIUM VAGINAE: HIGH {score} — AB
BVAB 2: HIGH Score — AB
CANDIDA ALBICANS, NAA: POSITIVE — AB
CANDIDA GLABRATA, NAA: NEGATIVE
Megasphaera 1: HIGH Score — AB
Trich vag by NAA: NEGATIVE

## 2015-11-21 ENCOUNTER — Other Ambulatory Visit: Payer: Self-pay | Admitting: Obstetrics

## 2015-11-25 ENCOUNTER — Telehealth: Payer: Self-pay | Admitting: *Deleted

## 2015-11-25 NOTE — Telephone Encounter (Signed)
Patient made aware of lab results and medication sent to pharmacy.Patient made aware no alcohol while on this medication.

## 2015-11-26 ENCOUNTER — Other Ambulatory Visit: Payer: Self-pay | Admitting: Obstetrics

## 2015-11-26 DIAGNOSIS — B3731 Acute candidiasis of vulva and vagina: Secondary | ICD-10-CM

## 2015-11-26 DIAGNOSIS — B9689 Other specified bacterial agents as the cause of diseases classified elsewhere: Secondary | ICD-10-CM

## 2015-11-26 DIAGNOSIS — N760 Acute vaginitis: Principal | ICD-10-CM

## 2015-11-26 DIAGNOSIS — B373 Candidiasis of vulva and vagina: Secondary | ICD-10-CM

## 2015-11-26 MED ORDER — FLUCONAZOLE 150 MG PO TABS: 150.0000 mg | ORAL_TABLET | Freq: Once | ORAL | 2 refills | Status: AC

## 2015-11-26 MED ORDER — METRONIDAZOLE 500 MG PO TABS: 500.0000 mg | ORAL_TABLET | Freq: Two times a day (BID) | ORAL | 2 refills | Status: DC

## 2015-12-08 ENCOUNTER — Telehealth: Payer: Self-pay | Admitting: Behavioral Health

## 2015-12-08 ENCOUNTER — Encounter: Payer: Self-pay | Admitting: Behavioral Health

## 2015-12-08 NOTE — Telephone Encounter (Signed)
Pre-Visit Call completed with patient and chart updated.   Pre-Visit Info documented in Specialty Comments under [employer].    

## 2015-12-09 ENCOUNTER — Ambulatory Visit (INDEPENDENT_AMBULATORY_CARE_PROVIDER_SITE_OTHER): Payer: Managed Care, Other (non HMO) | Admitting: Family Medicine

## 2015-12-09 ENCOUNTER — Encounter: Payer: Self-pay | Admitting: Family Medicine

## 2015-12-09 VITALS — BP 118/70 | HR 80 | Temp 98.5°F | Ht 62.0 in | Wt 223.0 lb

## 2015-12-09 DIAGNOSIS — L989 Disorder of the skin and subcutaneous tissue, unspecified: Secondary | ICD-10-CM | POA: Diagnosis not present

## 2015-12-09 MED ORDER — HYDROCODONE-ACETAMINOPHEN 5-325 MG PO TABS: 1.0000 | ORAL_TABLET | Freq: Four times a day (QID) | ORAL | 0 refills | Status: DC | PRN

## 2015-12-09 NOTE — Progress Notes (Signed)
Pre visit review using our clinic review tool, if applicable. No additional management support is needed unless otherwise documented below in the visit note. 

## 2015-12-09 NOTE — Progress Notes (Signed)
Chief Complaint  Patient presents with  . Establish Care    pt has abscess on bottom-drains and aches-for about 1 year-pt states it has been drained x 2        New Patient Visit SUBJECTIVE: HPI: [name] [name] is an 30 y.o.female who is being seen for establishing care.  The patient was previously seen with Dr. Larose Kells before she became uninsured.  Health maintenance history: Pap/pelvic: 2 weeks ago, abnormal will repeat-Dr. Jodi M[facility]ning at [facility] HIV screening: Yes- negative Tetanus: 2016  Patient has a one-year history of a lesion on her left buttock. It started as an abscess. She went to the emergency department on 2 different occasions and on both visits, she had an incision and drainage procedure done. It never fully healed and is extremely painful. She has tried Motrin combined with Tylenol has not been helpful. She is unable to sit normally. The patient states that a substances draining from it, milky in nature. She denies clothing rubbing against the area routinely as she does keep it covered. Her sister does have issues with hidradenitis suppurativa, however she does not have any other lesions. She denies any fevers or the area being exposed to stool.  Allergies  Allergen Reactions  . Vicodin [Hydrocodone-Acetaminophen]     GI s/e not true allergies     Past Medical History:  Diagnosis Date  . Abnormal Pap smear of cervix   . Abscess of buttock   . Allergic rhinitis   . Genital herpes   . Irregular periods   . Migraine    Past Surgical History:  Procedure Laterality Date  . INCISE AND DRAIN ABCESS     Social History   Social History  . Marital status: Single  . Number of children: 0   Occupational History  . accounting clerk  [employer]   Social History Main Topics  . Smoking status: Never Smoker  . Smokeless tobacco: Never Used  . Alcohol use Yes     Comment: socially   . Drug use: No  . Sexual activity: Yes    Partners: Male    Birth control/  protection: Condom   Social History Narrative   G0   Live w/ boyfriend   Family History  Problem Relation Age of Onset  . Cancer      bone marrow, aunt   . Breast cancer Other     great aunt  . CAD Neg Hx   . Diabetes Neg Hx   . Colon cancer Neg Hx   . Hypertension Neg Hx   . Heart disease Neg Hx      Current Outpatient Prescriptions:  .  medroxyPROGESTERone (DEPO-PROVERA) 150 MG/ML injection, Inject 1 mL (150 mg total) into the muscle every 3 (three) months., Disp: 1 mL, Rfl: 3 .  valACYclovir (VALTREX) 500 MG tablet, Take 500 mg by mouth 2 (two) times daily as needed (for outbreaks)., Disp: , Rfl:  .  HYDROcodone-acetaminophen (NORCO/VICODIN) 5-325 MG tablet, Take 1 tablet by mouth every 6 (six) hours as needed for moderate pain., Disp: 30 tablet, Rfl: 0  Patient's last menstrual period was 10/23/2015 (approximate).  ROS Consitutional: Denies fevers, chills  Cardiovascular: Denies chest pain or pressure, palpitations  Respiratory: Denies dyspnea, cough   OBJECTIVE: BP 118/70 (BP Location: Left Arm, Patient Position: Sitting, Cuff Size: Large)   Pulse 80   Temp 98.5 F (36.9 C) (Oral)   Ht 5\' 2"  (1.575 m)   Wt 223 lb (101.2 kg)  LMP 10/23/2015 (Approximate)   SpO2 99%   BMI 40.79 kg/m   Constitutional: -  VS reviewed -  Well developed, well nourished, appears stated age -  No apparent distress  Psychiatric: -  Oriented to person, place, and time -  Memory intact -  Affect and mood normal -  Fluent conversation, good eye contact -  Judgment and insight age appropriate  ENMT: -  No ulcers in her mouth  Neck: -  No gross swelling, no palpable masses -  Thyroid midline, not enlarged, mobile, no palpable masses  Cardiovascular: -  RRR, no murmurs -  No LE edema  Respiratory: -  Normal respiratory effort, no accessory muscle use, no retraction -  Breath sounds equal, no wheezes, no ronchi, no crackles  Gastrointestinal: -  Bowel sounds normal -  No tenderness,  no distention, no guarding, no masses  Musculoskeletal: -  No clubbing, no cyanosis -  Gait normal -  She does seem uncomfortable sitting normally in a chair   Skin: -  There is a 1 cm x 0.9 cm fleshy and erythematous lesion on the L buttock. There is no current drainage or surrounding erythema. It is TTP surrounding the region. There is a central opening that looks like a tract. -  I am unable to express any substance.    ASSESSMENT/PLAN: Skin lesion - Plan: HYDROcodone-acetaminophen (NORCO/VICODIN) 5-325 MG tablet, Ambulatory referral to General Surgery  The patient was found for undressing. She was examined in the presence of a female coworker/chaperone.  The lesion in question does not represent any abscess and is not suggestive of hidradenitis suppurativa. There is an opening, and given the history I am concerned for a fistula. Will ask for recommendation from our surgical specialist friends.  Course of Norco given. Allergies were reviewed with the patient.  Patient should return prn. The patient voiced understanding and agreement to the plan.   [place], DO 12/09/15  3:40 PM

## 2015-12-22 ENCOUNTER — Ambulatory Visit: Payer: Self-pay | Admitting: Surgery

## 2015-12-22 NOTE — H&P (Signed)
[name] [name] 12/22/2015 9:47 AM Location: [facility] Patient #: [id number] DOB: [birth year] Single / Language: [name] [name] / Race: White Female  History of Present Illness [name] A. Jeriah Skufca MD; 12/22/2015 10:14 AM) Patient words: buttock lesion   Patient's sent at the request of her primary care doctor secondary to a chronic left blood TEST. She had a left buttock abscess drained in October 2016. It is recurred twice since then. Location is the medial left buttock. Denies any history of a perirectal abscess or rectal drainage. The area is tender and has a clear drainage which is chronic in nature. Denies fever or chills. Denies incontinence of stool but does get occasionally incontinent of urine. She's had no bloody drainage.  The patient is a 30 year old female.   Other Problems [name] [name], CMA; 12/22/2015 9:49 AM) Back Pain Gastroesophageal Reflux Disease Migraine Headache Umbilical Hernia Repair  Past Surgical History [name] [name], [place]; 12/22/2015 9:49 AM) No pertinent past surgical history  Diagnostic Studies History [name] [name], [place]; 12/22/2015 9:49 AM) Colonoscopy never Mammogram never Pap Smear 1-5 years ago  Allergies [name] [name], CMA; 12/22/2015 9:49 AM) No Known Drug Allergies 12/22/2015  Medication History [name] [name], CMA; 12/22/2015 9:50 AM) Hydrocodone-Acetaminophen (5-325MG  Tablet, Oral) Active. Depo-Provera (150MG /ML Suspension, Intramuscular) Active. Medications Reconciled  Social History [name] [name], [place]; 12/22/2015 9:49 AM) Alcohol use Occasional alcohol use. Caffeine use Carbonated beverages, Coffee, Tea. No drug use Tobacco use Never smoker.  Family History [name] [name], [place]; 12/22/2015 9:49 AM) Alcohol Abuse Father. Migraine Headache Mother.  Pregnancy / Birth History [name] [name], [place]; 12/22/2015 9:49 AM) Age at menarche 40 years. Contraceptive History Depo-provera,  Oral contraceptives. Gravida 1 Irregular periods Maternal age 60-30 Para 1     Review of Systems [name] [name] CMA; 12/22/2015 9:49 AM) General Present- Fatigue. Not Present- Appetite Loss, Chills, Fever, Night Sweats, Weight Gain and Weight Loss. HEENT Not Present- Earache, Hearing Loss, Hoarseness, Nose Bleed, Oral Ulcers, Ringing in the Ears, Seasonal Allergies, Sinus Pain, Sore Throat, Visual Disturbances, Wears glasses/contact lenses and Yellow Eyes. Respiratory Not Present- Bloody sputum, Chronic Cough, Difficulty Breathing, Snoring and Wheezing. Cardiovascular Not Present- Chest Pain, Difficulty Breathing Lying Down, Leg Cramps, Palpitations, Rapid Heart Rate, Shortness of Breath and Swelling of Extremities. Gastrointestinal Present- Rectal Pain. Not Present- Abdominal Pain, Bloating, Bloody Stool, Change in Bowel Habits, Chronic diarrhea, Constipation, Difficulty Swallowing, Excessive gas, Gets full quickly at meals, Hemorrhoids, Indigestion, Nausea and Vomiting. Female Genitourinary Not Present- Frequency, Nocturia, Painful Urination, Pelvic Pain and Urgency. Musculoskeletal Present- Back Pain and Joint Pain. Not Present- Joint Stiffness, Muscle Pain, Muscle Weakness and Swelling of Extremities. Neurological Not Present- Decreased Memory, Fainting, Headaches, Numbness, Seizures, Tingling, Tremor, Trouble walking and Weakness. Psychiatric Not Present- Anxiety, Bipolar, Change in Sleep Pattern, Depression, Fearful and Frequent crying. Endocrine Not Present- Cold Intolerance, Excessive Hunger, Hair Changes, Heat Intolerance, Hot flashes and New Diabetes. Hematology Not Present- Blood Thinners, Easy Bruising, Excessive bleeding, Gland problems, HIV and Persistent Infections.  Vitals [employer] R. [name] CMA; 12/22/2015 9:48 AM) 12/22/2015 9:48 AM Weight: 220.5 lb Height: 62in Body Surface Area: 1.99 m Body Mass Index: 40.33 kg/m  BP: 130/84 (Sitting, Left Arm,  Standard)      Physical Exam (Georgana Romain A. Samariyah Cowles MD; 12/22/2015 10:15 AM)  General Mental Status-Alert. General Appearance-Consistent with stated age. Hydration-Well hydrated. Voice-Normal.  Head and Neck Head-normocephalic, atraumatic with no lesions or palpable masses. Trachea-midline. Thyroid Gland Characteristics - normal size and consistency.  Chest and Lung Exam Chest and lung exam reveals -quiet, even and easy respiratory effort with no use of accessory muscles and on auscultation, normal breath sounds, no adventitious sounds and normal vocal resonance. Inspection Chest Wall - Normal. Back - normal.  Cardiovascular Cardiovascular examination reveals -normal heart sounds, regular rate and rhythm with no murmurs and normal pedal pulses bilaterally.  Rectal Note: Left block approximately 6 cm from anal verge shows a small chronic red tender sinus tract. No large abscess. The area measures about a centimeter. Anal canal appears grossly normal. rectal normal  Neurologic Neurologic evaluation reveals -alert and oriented x 3 with no impairment of recent or remote memory. Mental Status-Normal.  Musculoskeletal Normal Exam - Left-Upper Extremity Strength Normal and Lower Extremity Strength Normal. Normal Exam - Right-Upper Extremity Strength Normal and Lower Extremity Strength Normal.    Assessment & Plan (Prospero Mahnke A. Damarys Speir MD; 12/22/2015 10:16 AM)  LEFT BUTTOCK ABSCESS (L02.31) Impression: nneds EUA and possible fistulotomy fistulectomy if fistula en ano  Discussed possibility of fistulotomy in the potential risk of incontinence with that. Given its location, I do not feel it is a fistula in ano at this point. This is more than likely a chronic abscess cavity that requires debridement. I discussed wound care as well as her.  Current Plans Pt Education - [facility] Free Text Education/Instructions: discussed with patient and provided information. The  anatomy and the physiology was discussed. The pathophysiology and natural history of the disease was discussed. Options were discussed and recommendations were made. Technique, risks, benefits, & alternatives were discussed. Risks such as stroke, heart attack, bleeding, indection, death, and other risks discussed. Questions answered. The patient agrees to proceed.

## 2015-12-23 ENCOUNTER — Encounter (HOSPITAL_BASED_OUTPATIENT_CLINIC_OR_DEPARTMENT_OTHER): Payer: Self-pay | Admitting: *Deleted

## 2015-12-28 ENCOUNTER — Ambulatory Visit (HOSPITAL_BASED_OUTPATIENT_CLINIC_OR_DEPARTMENT_OTHER): Payer: Managed Care, Other (non HMO) | Admitting: Anesthesiology

## 2015-12-28 ENCOUNTER — Encounter (HOSPITAL_BASED_OUTPATIENT_CLINIC_OR_DEPARTMENT_OTHER)
Admission: RE | Disposition: A | Discharge status: Home / Self Care | Payer: Self-pay | Source: Ambulatory Visit | Attending: Surgery

## 2015-12-28 ENCOUNTER — Encounter (HOSPITAL_BASED_OUTPATIENT_CLINIC_OR_DEPARTMENT_OTHER): Payer: Self-pay | Admitting: Certified Registered"

## 2015-12-28 ENCOUNTER — Ambulatory Visit (HOSPITAL_BASED_OUTPATIENT_CLINIC_OR_DEPARTMENT_OTHER)
Admission: RE | Admit: 2015-12-28 | Discharge: 2015-12-28 | Disposition: A | Discharge status: Home / Self Care | Payer: Managed Care, Other (non HMO) | Source: Ambulatory Visit | Attending: Surgery | Admitting: Surgery

## 2015-12-28 DIAGNOSIS — Z6841 Body Mass Index (BMI) 40.0 and over, adult: Secondary | ICD-10-CM | POA: Diagnosis not present

## 2015-12-28 DIAGNOSIS — L0231 Cutaneous abscess of buttock: Secondary | ICD-10-CM | POA: Insufficient documentation

## 2015-12-28 DIAGNOSIS — Z793 Long term (current) use of hormonal contraceptives: Secondary | ICD-10-CM | POA: Diagnosis not present

## 2015-12-28 HISTORY — PX: IRRIGATION AND DEBRIDEMENT BUTTOCKS: SHX6601

## 2015-12-28 HISTORY — DX: Gastro-esophageal reflux disease without esophagitis: K21.9

## 2015-12-28 SURGERY — EXAM UNDER ANESTHESIA
Anesthesia: General | Site: Buttocks

## 2015-12-28 MED ORDER — MIDAZOLAM HCL 2 MG/2ML IJ SOLN
1.0000 mg | INTRAMUSCULAR | Status: DC | PRN
  Administered 2015-12-28: 2 mg via INTRAVENOUS

## 2015-12-28 MED ORDER — FENTANYL CITRATE (PF) 100 MCG/2ML IJ SOLN
INTRAMUSCULAR | Status: AC
  Filled 2015-12-28: qty 2

## 2015-12-28 MED ORDER — DEXAMETHASONE SODIUM PHOSPHATE 4 MG/ML IJ SOLN
INTRAMUSCULAR | Status: DC | PRN
  Administered 2015-12-28: 10 mg via INTRAVENOUS

## 2015-12-28 MED ORDER — HYDROMORPHONE HCL 1 MG/ML IJ SOLN
0.2500 mg | INTRAMUSCULAR | Status: DC | PRN
  Administered 2015-12-28: 0.5 mg via INTRAVENOUS

## 2015-12-28 MED ORDER — CHLORHEXIDINE GLUCONATE CLOTH 2 % EX PADS: 6.0000 | MEDICATED_PAD | Freq: Once | CUTANEOUS | Status: DC

## 2015-12-28 MED ORDER — ONDANSETRON HCL 4 MG/2ML IJ SOLN
INTRAMUSCULAR | Status: DC | PRN
  Administered 2015-12-28: 4 mg via INTRAVENOUS

## 2015-12-28 MED ORDER — OXYCODONE-ACETAMINOPHEN 5-325 MG PO TABS: 1.0000 | ORAL_TABLET | ORAL | 0 refills | Status: DC | PRN

## 2015-12-28 MED ORDER — BUPIVACAINE-EPINEPHRINE 0.25% -1:200000 IJ SOLN
INTRAMUSCULAR | Status: DC | PRN
  Administered 2015-12-28: 20 mL

## 2015-12-28 MED ORDER — DEXTROSE 5 % IV SOLN
3.0000 g | INTRAVENOUS | Status: AC
  Administered 2015-12-28: 3 g via INTRAVENOUS

## 2015-12-28 MED ORDER — GABAPENTIN 300 MG PO CAPS: 300.0000 mg | ORAL_CAPSULE | ORAL | Status: DC

## 2015-12-28 MED ORDER — PROPOFOL 10 MG/ML IV BOLUS
INTRAVENOUS | Status: DC | PRN
Start: 2015-12-28 — End: 2015-12-28
  Administered 2015-12-28: 200 mg via INTRAVENOUS

## 2015-12-28 MED ORDER — BUPIVACAINE-EPINEPHRINE (PF) 0.25% -1:200000 IJ SOLN
INTRAMUSCULAR | Status: AC
  Filled 2015-12-28: qty 30

## 2015-12-28 MED ORDER — GLYCOPYRROLATE 0.2 MG/ML IJ SOLN: 0.2000 mg | Freq: Once | INTRAMUSCULAR | Status: DC | PRN

## 2015-12-28 MED ORDER — HYDROMORPHONE HCL 1 MG/ML IJ SOLN
INTRAMUSCULAR | Status: AC
  Filled 2015-12-28: qty 1

## 2015-12-28 MED ORDER — CELECOXIB 400 MG PO CAPS: 400.0000 mg | ORAL_CAPSULE | ORAL | Status: DC

## 2015-12-28 MED ORDER — CEFAZOLIN SODIUM-DEXTROSE 2-4 GM/100ML-% IV SOLN
INTRAVENOUS | Status: AC
  Filled 2015-12-28: qty 100

## 2015-12-28 MED ORDER — FENTANYL CITRATE (PF) 100 MCG/2ML IJ SOLN
50.0000 ug | INTRAMUSCULAR | Status: AC | PRN
  Administered 2015-12-28: 50 ug via INTRAVENOUS
  Administered 2015-12-28: 25 ug via INTRAVENOUS
  Administered 2015-12-28: 100 ug via INTRAVENOUS
  Administered 2015-12-28: 25 ug via INTRAVENOUS

## 2015-12-28 MED ORDER — SCOPOLAMINE 1 MG/3DAYS TD PT72: 1.0000 | MEDICATED_PATCH | Freq: Once | TRANSDERMAL | Status: DC | PRN

## 2015-12-28 MED ORDER — LIDOCAINE HCL (CARDIAC) 20 MG/ML IV SOLN
INTRAVENOUS | Status: DC | PRN
  Administered 2015-12-28: 60 mg via INTRAVENOUS

## 2015-12-28 MED ORDER — LACTATED RINGERS IV SOLN
INTRAVENOUS | Status: DC
  Administered 2015-12-28 (×2): via INTRAVENOUS

## 2015-12-28 SURGICAL SUPPLY — 37 items
BLADE SURG 15 STRL LF DISP TIS (BLADE) ×2 IMPLANT
BLADE SURG 15 STRL SS (BLADE) ×1
BRIEF STRETCH FOR OB PAD LRG (UNDERPADS AND DIAPERS) IMPLANT
CANISTER SUCT 1200ML W/VALVE (MISCELLANEOUS) ×3 IMPLANT
COVER MAYO STAND STRL (DRAPES) IMPLANT
DECANTER SPIKE VIAL GLASS SM (MISCELLANEOUS) ×3 IMPLANT
DRAPE UTILITY XL STRL (DRAPES) ×3 IMPLANT
DRSG PAD ABDOMINAL 8X10 ST (GAUZE/BANDAGES/DRESSINGS) ×3 IMPLANT
ELECT REM PT RETURN 9FT ADLT (ELECTROSURGICAL)
ELECTRODE REM PT RTRN 9FT ADLT (ELECTROSURGICAL) IMPLANT
FILTER 7/8 IN (FILTER) IMPLANT
GLOVE BIO SURGEON STRL SZ 6.5 (GLOVE) ×3 IMPLANT
GLOVE BIOGEL PI IND STRL 7.0 (GLOVE) ×4 IMPLANT
GLOVE BIOGEL PI IND STRL 8 (GLOVE) ×2 IMPLANT
GLOVE BIOGEL PI INDICATOR 7.0 (GLOVE) ×2
GLOVE BIOGEL PI INDICATOR 8 (GLOVE) ×1
GLOVE ECLIPSE 8.0 STRL XLNG CF (GLOVE) ×3 IMPLANT
GOWN STRL REUS W/ TWL LRG LVL3 (GOWN DISPOSABLE) ×4 IMPLANT
GOWN STRL REUS W/TWL LRG LVL3 (GOWN DISPOSABLE) ×2
NEEDLE HYPO 25X1 1.5 SAFETY (NEEDLE) ×3 IMPLANT
PACK BASIN DAY SURGERY FS (CUSTOM PROCEDURE TRAY) ×3 IMPLANT
PACK LITHOTOMY IV (CUSTOM PROCEDURE TRAY) ×3 IMPLANT
PENCIL BUTTON HOLSTER BLD 10FT (ELECTRODE) IMPLANT
SHEET MEDIUM DRAPE 40X70 STRL (DRAPES) IMPLANT
SPONGE GAUZE 4X4 12PLY STER LF (GAUZE/BANDAGES/DRESSINGS) ×3 IMPLANT
SPONGE SURGIFOAM ABS GEL 12-7 (HEMOSTASIS) IMPLANT
SURGILUBE 2OZ TUBE FLIPTOP (MISCELLANEOUS) ×3 IMPLANT
SUT CHROMIC 3 0 SH 27 (SUTURE) IMPLANT
SYR CONTROL 10ML LL (SYRINGE) ×3 IMPLANT
TOWEL OR 17X24 6PK STRL BLUE (TOWEL DISPOSABLE) ×6 IMPLANT
TOWEL OR NON WOVEN STRL DISP B (DISPOSABLE) ×3 IMPLANT
TRAY DSU PREP LF (CUSTOM PROCEDURE TRAY) ×3 IMPLANT
TRAY PROCTOSCOPIC FIBER OPTIC (SET/KITS/TRAYS/PACK) IMPLANT
TUBE CONNECTING 20X1/4 (TUBING) ×3 IMPLANT
UNDERPAD 30X30 (UNDERPADS AND DIAPERS) ×3 IMPLANT
VAC PENCILS W/TUBING CLEAR (MISCELLANEOUS) IMPLANT
YANKAUER SUCT BULB TIP NO VENT (SUCTIONS) ×3 IMPLANT

## 2015-12-28 NOTE — Op Note (Signed)
Preoperative diagnosis: Chronic left buttock abscess  Postoperative diagnosis same  Procedure: Drainage of chronic left buttock abscess  Surgeon: Erroll Luna M.D.  Anesthesia: Gen.  EBL: Minimal  Specimen: Tissue from left buttock wound to pathology  Indications for procedure: The patient is a 30 year old female who's had a long-standing history of a chronic medial left buttock abscess. This is been drained by multiple emergency rooms in the last 6 months but continues to recur. Is causing chronic pain in a chronic wound with granulation tissue on her medial left buttock. This is about 6 cm from her anal verge. I recommended debridement in the operating room since this is an ongoing chronic problem that has failed multiple IND's and antibiotic therapy. Risk of bleeding, infection, possible fecal incontinence, injury to neighboring structures, injuries rectum, injury to the vaginal wall, the need for other operative procedures and/or interventions. Success rates of 95% of resolving this problem surgery. She agreed to proceed.   Description of procedure: The patient was met in the holding area and questions are answered. She's taken back to the operating room and placed upon the operating room table. After induction of general anesthesia she was placed in lithotomy stirrups. The perineum was prepped and draped in a sterile fashion using Betadine timeout was done. The abscess wound was noted a left medial buttock. There is about 2 cm of chronic granulation granulation tissue. I used a sinus tract probe to probe this and this went to a depth of about 2-3 cm medially toward the anus anal canal but did not enter the anal canal. Anoscope was placed muscle no evidence of a fistula in anal. There is no mass or other signs of anal canal or rectal pathology. I then opened the cavity. I debrided chronic granulation tissue and debrided a small area of a tract that went down about 2 cm into this chronic  abscess cavity. There is no pus.The cavity was left open after draining this area and cleaning it out. It was packed with saline soaked Curlex and measured approximately 2 cm in maximal diameter. Dry dressings were placed over this after ensuring hemostasis.Her place. The patient was taken to lithotomy at this point time. All final counts are found to be correct. She was awoke extubated taken to recovery in satisfactory condition.

## 2015-12-28 NOTE — Transfer of Care (Signed)
Immediate Anesthesia Transfer of Care Note  Patient: [name] [name]  Procedure(s) Performed: Procedure(s): EXAM UNDER ANESTHESIA (N/A) IRRIGATION AND DEBRIDEMENT BUTTOCKS ABSCESS CHRONIC (Left)  Patient Location: PACU  Anesthesia Type:General  Level of Consciousness: awake, alert , oriented and patient cooperative  Airway & Oxygen Therapy: Patient Spontanous Breathing and Patient connected to face mask oxygen  Post-op Assessment: Report given to RN and Post -op Vital signs reviewed and stable  Post vital signs: Reviewed and stable  Last Vitals:  Vitals:   12/28/15 1248 12/28/15 1430  BP: (!) 115/57 118/76  Pulse: 79 100  Resp: 16 19  Temp: 37 C (P) 36.4 C    Last Pain:  Vitals:   12/28/15 1248  TempSrc: Oral  PainSc:          Complications: No apparent anesthesia complications

## 2015-12-28 NOTE — Discharge Instructions (Signed)
[facility] _______[facility] ° °RECTAL SURGERY POST OP INSTRUCTIONS: POST OP INSTRUCTIONS ° °Always review your discharge instruction sheet given to you by the facility where your surgery was performed. °IF YOU HAVE DISABILITY OR FAMILY LEAVE FORMS, YOU MUST BRING THEM TO THE OFFICE FOR PROCESSING.   °DO NOT GIVE THEM TO YOUR DOCTOR. ° °1. A  prescription for pain medication may be given to you upon discharge.  Take your pain medication as prescribed, if needed.  If narcotic pain medicine is not needed, then you may take acetaminophen (Tylenol) or ibuprofen (Advil) as needed. °2. Take your usually prescribed medications unless otherwise directed. °3. If you need a refill on your pain medication, please contact your pharmacy.  They will contact our office to request authorization. Prescriptions will not be filled after 5 pm or on week-ends. °4. You should follow a light diet the first 48 hours after arrival home, such as soup and crackers, etc.  Be sure to include lots of fluids daily.  Resume your normal diet 2-3 days after surgery.. °5. Most patients will experience some swelling and discomfort in the rectal area. Ice packs, reclining and warm tub soaks will help.  Swelling and discomfort can take several days to resolve.  °6. It is common to experience some constipation if taking pain medication after surgery.  Increasing fluid intake and taking a stool softener (such as Colace) will usually help or prevent this problem from occurring.  A mild laxative (Milk of Magnesia or Miralax) should be taken according to package directions if there are no bowel movements after 48 hours. °7. Unless discharge instructions indicate otherwise, leave your bandage dry and in place for 24 hours, or remove the bandage if you have a bowel movement. You may notice a small amount of bleeding with bowel movements for the first few days. You may have some packing in the rectum which will come out over the first day or two. You  will need to wear an absorbent pad or soft cotton gauze in your underwear until the drainage stops.it. °8. ACTIVITIES:  You may resume regular (light) daily activities beginning the next day--such as daily self-care, walking, climbing stairs--gradually increasing activities as tolerated.  You may have sexual intercourse when it is comfortable.  Refrain from any heavy lifting or straining until approved by your doctor. °a. You may drive when you are no longer taking prescription pain medication, you can comfortably wear a seatbelt, and you can safely maneuver your car and apply brakes. °b. RETURN TO WORK: : ____________________ °c.  °9. You should see your doctor in the office for a follow-up appointment approximately 2-3 weeks after your surgery.  Make sure that you call for this appointment within a day or two after you arrive home to insure a convenient appointment time. °10. OTHER INSTRUCTIONS:  __________________________________________________________________________________________________________________________________________________________________________________________  °WHEN TO CALL YOUR DOCTOR: °1. Fever over 101.0 °2. Inability to urinate °3. Nausea and/or vomiting °4. Extreme swelling or bruising °5. Continued bleeding from rectum. °6. Increased pain, redness, or drainage from the incision °7. Constipation ° °The clinic staff is available to answer your questions during regular business hours.  Please don’t hesitate to call and ask to speak to one of the nurses for clinical concerns.  If you have a medical emergency, go to the nearest emergency room or call 911.  A surgeon from [facility] is always on call at the hospital ° ° °[address], [address], [place],   [address] ? °   [id number], [place], [place]   [address] [phone] ? [phone] ? FAX [phone] Web site: [email or web address]  ANORECTAL SURGERY:  POST OPERATIVE  INSTRUCTIONS  ######################################################################  EAT Gradually transition to a high fiber diet with a fiber supplement over the next few weeks after discharge.  Start with a pureed / full liquid diet (see below)  WALK Walk an hour a day.  Control your pain to do that.    CONTROL PAIN Control pain so that you can walk, sleep, tolerate sneezing/coughing, go up/down stairs.  HAVE A BOWEL MOVEMENT DAILY Keep your bowels regular to avoid problems.  OK to try a laxative to override constipation.  OK to use an antidairrheal to slow down diarrhea.  Call if not better after 2 tries  CALL IF YOU HAVE PROBLEMS/CONCERNS Call if you are still struggling despite following these instructions. Call if you have concerns not answered by these instructions  ######################################################################    11. Take your usually prescribed home medications unless otherwise directed. 12. DIET: Follow a light bland diet the first 24 hours after arrival home, such as soup, liquids, crackers, etc.  Be sure to include lots of fluids daily.  Avoid fast food or heavy meals as your are more likely to get nauseated.  Eat a low fat the next few days after surgery.   13. PAIN CONTROL: a. Pain is best controlled by a usual combination of three different methods TOGETHER: i. Ice/Heat ii. Over the counter pain medication iii. Prescription pain medication b. Most patients will experience some swelling and discomfort in the anus/rectal area. and incisions.  Ice packs or heat (30-60 minutes up to 6 times a day) will help. Use ice for the first few days to help decrease swelling and bruising, then switch to heat such as warm towels, sitz baths, warm baths, etc to help relax tight/sore spots and speed recovery.  Some people prefer to use ice alone, heat alone, alternating between ice & heat.  Experiment to what works for you.  Swelling and bruising can take  several weeks to resolve.   c. It is helpful to take an over-the-counter pain medication regularly for the first few weeks.  Choose one of the following that works best for you: i. Naproxen (Aleve, etc)  Two 220mg  tabs twice a day ii. Ibuprofen (Advil, etc) Three 200mg  tabs four times a day (every meal & bedtime) iii. Acetaminophen (Tylenol, etc) 500-650mg  four times a day (every meal & bedtime) d. A  prescription for pain medication (such as oxycodone, hydrocodone, etc) should be given to you upon discharge.  Take your pain medication as prescribed.  i. If you are having problems/concerns with the prescription medicine (does not control pain, nausea, vomiting, rash, itching, etc), please call us [phone] to see if we need to switch you to a different pain medicine that will work better for you and/or control your side effect better. ii. If you need a refill on your pain medication, please contact your pharmacy.  They will contact our office to request authorization. Prescriptions will not be filled after 5 pm or on week-ends.  Use a Sitz Bath 4-8 times a day for relief   [employer] A sitz bath is a warm water bath taken in the sitting position that covers only the hips and buttocks. It may be used for either healing or hygiene purposes. Sitz baths are also used to relieve pain, itching, or muscle spasms. The water may contain medicine. Moist heat will help  you heal and relax.  HOME CARE INSTRUCTIONS  Take 3 to 4 sitz baths a day. 1. Fill the bathtub half full with warm water. 2. Sit in the water and open the drain a little. 3. Turn on the warm water to keep the tub half full. Keep the water running constantly. 4. Soak in the water for 15 to 20 minutes. 5. After the sitz bath, pat the affected area dry first.   14. KEEP YOUR BOWELS REGULAR a. The goal is one bowel movement a day b. Avoid getting constipated.  Between the surgery and the pain medications, it is common to experience some  constipation.  Increasing fluid intake and taking a fiber supplement (such as Metamucil, Citrucel, FiberCon, MiraLax, etc) 1-2 times a day regularly will usually help prevent this problem from occurring.  A mild laxative (prune juice, Milk of Magnesia, MiraLax, etc) should be taken according to package directions if there are no bowel movements after 48 hours. c. Watch out for diarrhea.  If you have many loose bowel movements, simplify your diet to bland foods & liquids for a few days.  Stop any stool softeners and decrease your fiber supplement.  Switching to mild anti-diarrheal medications (Kayopectate, Pepto Bismol) can help.  If this worsens or does not improve, please call us.  15. Wound Care  a. Remove your bandages the day after surgery.  Unless discharge instructions indicate otherwise, leave your bandage dry and in place overnight.  Remove the bandage during your first bowel movement.   b. Wear an absorbent pad or soft cotton gauze in your underwear as needed to catch any drainage and help keep the area  c. Keep the area clean and dry.  Bathe / shower every day.  Keep the area clean by showering / bathing over the incision / wound.   It is okay to soak an open wound to help wash it.  Wet wipes or showers / gentle washing after bowel movements is often less traumatic than regular toilet paper. d. Dennis Bast will often notice bleeding with bowel movements.  This should slow down by the end of the first week of surgery e. Expect some drainage.  This should slow down, too, by the end of the first week of surgery.  Wear an absorbent pad or soft cotton gauze in your underwear until the drainage stops.  16. ACTIVITIES as tolerated:   a. You may resume regular (light) daily activities beginning the next day--such as daily self-care, walking, climbing stairs--gradually increasing activities as tolerated.  If you can walk 30 minutes without difficulty, it is safe to try more intense activity such as jogging,  treadmill, bicycling, low-impact aerobics, swimming, etc. b. Save the most intensive and strenuous activity for last such as sit-ups, heavy lifting, contact sports, etc  Refrain from any heavy lifting or straining until you are off narcotics for pain control.   c. DO NOT PUSH THROUGH PAIN.  Let pain be your guide: If it hurts to do something, don't do it.  Pain is your body warning you to avoid that activity for another week until the pain goes down. d. You may drive when you are no longer taking prescription pain medication, you can comfortably sit for long periods of time, and you can safely maneuver your car and apply brakes. e. Dennis Bast may have sexual intercourse when it is comfortable.  17. FOLLOW UP in our office a. Please call [facility] at [phone] to set up an appointment to see your  surgeon in the office for a follow-up appointment approximately 2 weeks after your surgery. b. Make sure that you call for this appointment the day you arrive home to insure a convenient appointment time. 10. IF YOU HAVE DISABILITY OR FAMILY LEAVE FORMS, BRING THEM TO THE OFFICE FOR PROCESSING.  DO NOT GIVE THEM TO YOUR DOCTOR.        WHEN TO CALL us [phone]: 8. Poor pain control 9. Reactions / problems with new medications (rash/itching, nausea, etc)  10. Fever over 101.5 F (38.5 C) 11. Inability to urinate 12. Nausea and/or vomiting 13. Worsening swelling or bruising 14. Continued bleeding from incision. 15. Increased pain, redness, or drainage from the incision  The clinic staff is available to answer your questions during regular business hours (8:30am-5pm).  Please dont hesitate to call and ask to speak to one of our nurses for clinical concerns.   A surgeon from [facility] is always on call at the hospitals   If you have a medical emergency, go to the nearest emergency room or call 911.    [facility], [place], [place], [place], [place]   [address] ? MAIN: [phone] ? TOLL FREE: [phone] ? FAX [phone] [email or web address]        Post Anesthesia Home Care Instructions  Activity: Get plenty of rest for the remainder of the day. A responsible adult should stay with you for 24 hours following the procedure.  For the next 24 hours, DO NOT: -Drive a car -Paediatric nurse -Drink alcoholic beverages -Take any medication unless instructed by your physician -Make any legal decisions or sign important papers.  Meals: Start with liquid foods such as gelatin or soup. Progress to regular foods as tolerated. Avoid greasy, spicy, heavy foods. If nausea and/or vomiting occur, drink only clear liquids until the nausea and/or vomiting subsides. Call your physician if vomiting continues.  Special Instructions/Symptoms: Your throat may feel dry or sore from the anesthesia or the breathing tube placed in your throat during surgery. If this causes discomfort, gargle with warm salt water. The discomfort should disappear within 24 hours.  If you had a scopolamine patch placed behind your ear for the management of post- operative nausea and/or vomiting:  1. The medication in the patch is effective for 72 hours, after which it should be removed.  Wrap patch in a tissue and discard in the trash. Wash hands thoroughly with soap and water. 2. You may remove the patch earlier than 72 hours if you experience unpleasant side effects which may include dry mouth, dizziness or visual disturbances. 3. Avoid touching the patch. Wash your hands with soap and water after contact with the patch.

## 2015-12-28 NOTE — Interval H&P Note (Signed)
History and Physical Interval Note:  12/28/2015 1:37 PM  [name] [name]  has presented today for surgery, with the diagnosis of chronic left buttock abscess  The various methods of treatment have been discussed with the patient and family. After consideration of risks, benefits and other options for treatment, the patient has consented to  Procedure(s): EXAM UNDER ANESTHESIA (N/A) as a surgical intervention .  The patient's history has been reviewed, patient examined, no change in status, stable for surgery.  I have reviewed the patient's chart and labs.  Questions were answered to the patient's satisfaction.     [name] A.

## 2015-12-28 NOTE — Anesthesia Preprocedure Evaluation (Addendum)
Anesthesia Evaluation  Patient identified by MRN, date of birth, ID band Patient awake    Reviewed: Allergy & Precautions, NPO status , Patient's Chart, lab work & pertinent test results  Airway Mallampati: II  TM Distance: >3 FB     Dental   Pulmonary    breath sounds clear to auscultation       Cardiovascular negative cardio ROS   Rhythm:Regular Rate:Normal     Neuro/Psych  Headaches,    GI/Hepatic Neg liver ROS, GERD  Medicated,  Endo/Other  Morbid obesity  Renal/GU negative Renal ROS     Musculoskeletal   Abdominal   Peds  Hematology   Anesthesia Other Findings   Reproductive/Obstetrics                            Anesthesia Physical Anesthesia Plan  ASA: II  Anesthesia Plan: General   Post-op Pain Management:    Induction: Intravenous  Airway Management Planned: LMA  Additional Equipment:   Intra-op Plan:   Post-operative Plan: Extubation in OR  Informed Consent:   Dental advisory given  Plan Discussed with: CRNA and Anesthesiologist  Anesthesia Plan Comments:         Anesthesia Quick Evaluation

## 2015-12-28 NOTE — H&P (View-Only) (Signed)
[name] [name] 12/22/2015 9:47 AM Location: [facility] Patient #: [id number] DOB: July 09, 1985 Single / Language: [name] [name] / Race: White Female  History of Present Illness [name] A. [name] MD; 12/22/2015 10:14 AM) Patient words: buttock lesion   Patient's sent at the request of her primary care doctor secondary to a chronic left blood TEST. She had a left buttock abscess drained in October 2016. It is recurred twice since then. Location is the medial left buttock. Denies any history of a perirectal abscess or rectal drainage. The area is tender and has a clear drainage which is chronic in nature. Denies fever or chills. Denies incontinence of stool but does get occasionally incontinent of urine. She's had no bloody drainage.  The patient is a 30 year old female.   Other Problems [name] [name], CMA; 12/22/2015 9:49 AM) Back Pain Gastroesophageal Reflux Disease Migraine Headache Umbilical Hernia Repair  Past Surgical History [name] [name], [place]; 12/22/2015 9:49 AM) No pertinent past surgical history  Diagnostic Studies History [name] [name], [place]; 12/22/2015 9:49 AM) Colonoscopy never Mammogram never Pap Smear 1-5 years ago  Allergies [name] [name], CMA; 12/22/2015 9:49 AM) No Known Drug Allergies 12/22/2015  Medication History [name] [name], CMA; 12/22/2015 9:50 AM) Hydrocodone-Acetaminophen (5-325MG  Tablet, Oral) Active. Depo-Provera (150MG /ML Suspension, Intramuscular) Active. Medications Reconciled  Social History [name] [name], [place]; 12/22/2015 9:49 AM) Alcohol use Occasional alcohol use. Caffeine use Carbonated beverages, Coffee, Tea. No drug use Tobacco use Never smoker.  Family History [name] [name], [place]; 12/22/2015 9:49 AM) Alcohol Abuse Father. Migraine Headache Mother.  Pregnancy / Birth History [name] [name], [place]; 12/22/2015 9:49 AM) Age at menarche 30 years. Contraceptive History Depo-provera,  Oral contraceptives. Gravida 1 Irregular periods Maternal age 30-30 Para 1     Review of Systems [name] [name] CMA; 12/22/2015 9:49 AM) General Present- Fatigue. Not Present- Appetite Loss, Chills, Fever, Night Sweats, Weight Gain and Weight Loss. HEENT Not Present- Earache, Hearing Loss, Hoarseness, Nose Bleed, Oral Ulcers, Ringing in the Ears, Seasonal Allergies, Sinus Pain, Sore Throat, Visual Disturbances, Wears glasses/contact lenses and Yellow Eyes. Respiratory Not Present- Bloody sputum, Chronic Cough, Difficulty Breathing, Snoring and Wheezing. Cardiovascular Not Present- Chest Pain, Difficulty Breathing Lying Down, Leg Cramps, Palpitations, Rapid Heart Rate, Shortness of Breath and Swelling of Extremities. Gastrointestinal Present- Rectal Pain. Not Present- Abdominal Pain, Bloating, Bloody Stool, Change in Bowel Habits, Chronic diarrhea, Constipation, Difficulty Swallowing, Excessive gas, Gets full quickly at meals, Hemorrhoids, Indigestion, Nausea and Vomiting. Female Genitourinary Not Present- Frequency, Nocturia, Painful Urination, Pelvic Pain and Urgency. Musculoskeletal Present- Back Pain and Joint Pain. Not Present- Joint Stiffness, Muscle Pain, Muscle Weakness and Swelling of Extremities. Neurological Not Present- Decreased Memory, Fainting, Headaches, Numbness, Seizures, Tingling, Tremor, Trouble walking and Weakness. Psychiatric Not Present- Anxiety, Bipolar, Change in Sleep Pattern, Depression, Fearful and Frequent crying. Endocrine Not Present- Cold Intolerance, Excessive Hunger, Hair Changes, Heat Intolerance, Hot flashes and New Diabetes. Hematology Not Present- Blood Thinners, Easy Bruising, Excessive bleeding, Gland problems, HIV and Persistent Infections.  Vitals [employer] R. [name] CMA; 12/22/2015 9:48 AM) 12/22/2015 9:48 AM Weight: 220.5 lb Height: 62in Body Surface Area: 1.99 m Body Mass Index: 40.33 kg/m  BP: 130/84 (Sitting, Left Arm,  Standard)      Physical Exam ([name] A. Abimbola Aki MD; 12/22/2015 10:15 AM)  General Mental Status-Alert. General Appearance-Consistent with stated age. Hydration-Well hydrated. Voice-Normal.  Head and Neck Head-normocephalic, atraumatic with no lesions or palpable masses. Trachea-midline. Thyroid Gland Characteristics - normal size and consistency.  Chest and Lung Exam Chest and lung exam reveals -quiet, even and easy respiratory effort with no use of accessory muscles and on auscultation, normal breath sounds, no adventitious sounds and normal vocal resonance. Inspection Chest Wall - Normal. Back - normal.  Cardiovascular Cardiovascular examination reveals -normal heart sounds, regular rate and rhythm with no murmurs and normal pedal pulses bilaterally.  Rectal Note: Left block approximately 6 cm from anal verge shows a small chronic red tender sinus tract. No large abscess. The area measures about a centimeter. Anal canal appears grossly normal. rectal normal  Neurologic Neurologic evaluation reveals -alert and oriented x 3 with no impairment of recent or remote memory. Mental Status-Normal.  Musculoskeletal Normal Exam - Left-Upper Extremity Strength Normal and Lower Extremity Strength Normal. Normal Exam - Right-Upper Extremity Strength Normal and Lower Extremity Strength Normal.    Assessment & Plan (Marli Diego A. [name] MD; 12/22/2015 10:16 AM)  LEFT BUTTOCK ABSCESS (L02.31) Impression: nneds EUA and possible fistulotomy fistulectomy if fistula en ano  Discussed possibility of fistulotomy in the potential risk of incontinence with that. Given its location, I do not feel it is a fistula in ano at this point. This is more than likely a chronic abscess cavity that requires debridement. I discussed wound care as well as her.  Current Plans Pt Education - [facility] Free Text Education/Instructions: discussed with patient and provided information. The  anatomy and the physiology was discussed. The pathophysiology and natural history of the disease was discussed. Options were discussed and recommendations were made. Technique, risks, benefits, & alternatives were discussed. Risks such as stroke, heart attack, bleeding, indection, death, and other risks discussed. Questions answered. The patient agrees to proceed.

## 2015-12-28 NOTE — Anesthesia Procedure Notes (Signed)
Procedure Name: LMA Insertion Date/Time: 12/28/2015 1:46 PM Performed by: Nayana Lenig D Pre-anesthesia Checklist: Patient identified, Emergency Drugs available, Suction available and Patient being monitored Patient Re-evaluated:Patient Re-evaluated prior to inductionOxygen Delivery Method: Circle system utilized Preoxygenation: Pre-oxygenation with 100% oxygen Intubation Type: IV induction Ventilation: Mask ventilation without difficulty LMA: LMA inserted LMA Size: 4.0 Number of attempts: 1 Airway Equipment and Method: Bite block Placement Confirmation: positive ETCO2 Tube secured with: Tape Dental Injury: Teeth and Oropharynx as per pre-operative assessment

## 2015-12-28 NOTE — Anesthesia Postprocedure Evaluation (Signed)
Anesthesia Post Note  Patient: [name] [name]  Procedure(s) Performed: Procedure(s) (LRB): EXAM UNDER ANESTHESIA (N/A) IRRIGATION AND DEBRIDEMENT BUTTOCKS ABSCESS CHRONIC (Left)  Patient location during evaluation: PACU Anesthesia Type: General Level of consciousness: awake and alert Pain management: pain level controlled Vital Signs Assessment: post-procedure vital signs reviewed and stable Respiratory status: spontaneous breathing, nonlabored ventilation, respiratory function stable and patient connected to nasal cannula oxygen Cardiovascular status: blood pressure returned to baseline and stable Postop Assessment: no signs of nausea or vomiting Anesthetic complications: no    Last Vitals:  Vitals:   12/28/15 1523 12/28/15 1610  BP: 122/71 140/68  Pulse: 88 78  Resp: (!) 23 18  Temp:  37.5 C    Last Pain:  Vitals:   12/28/15 1610  TempSrc: Oral  PainSc: 3                  Tiajuana Amass

## 2015-12-29 ENCOUNTER — Encounter (HOSPITAL_BASED_OUTPATIENT_CLINIC_OR_DEPARTMENT_OTHER): Payer: Self-pay | Admitting: Surgery

## 2016-01-18 ENCOUNTER — Telehealth: Payer: Self-pay | Admitting: Family Medicine

## 2016-01-18 DIAGNOSIS — A6004 Herpesviral vulvovaginitis: Secondary | ICD-10-CM

## 2016-01-18 NOTE — Telephone Encounter (Signed)
Caller name: Relationship to patient: Can be reached: [phone]  Pharmacy:  [facility], [place]. [phone] ([phone] (Fax)     Reason for call: Refill on valACYclovir (VALTREX) 1000 MG tablet

## 2016-01-21 ENCOUNTER — Encounter: Payer: Self-pay | Admitting: Family Medicine

## 2016-01-21 ENCOUNTER — Other Ambulatory Visit: Payer: Self-pay

## 2016-01-21 MED ORDER — VALACYCLOVIR HCL 1 G PO TABS: 1000.0000 mg | ORAL_TABLET | Freq: Every day | ORAL | 1 refills | Status: DC

## 2016-01-21 NOTE — Telephone Encounter (Signed)
Pt requested refill for Valacylovir 1000 mg. Per Dr. Nani Ravens request it is ok to refill medication for 1000 mg once daily as needed for flare up with 1 refill request. I will alert patient Rx has been sent to pharmacy for pick up.

## 2016-01-21 NOTE — Telephone Encounter (Signed)
Pt has been alerted Rx is sent to pharmacy. TL/CMA

## 2016-02-07 ENCOUNTER — Ambulatory Visit: Payer: Self-pay

## 2016-02-08 ENCOUNTER — Ambulatory Visit (INDEPENDENT_AMBULATORY_CARE_PROVIDER_SITE_OTHER): Payer: Managed Care, Other (non HMO) | Admitting: *Deleted

## 2016-02-08 VITALS — Wt 220.0 lb

## 2016-02-08 DIAGNOSIS — Z3042 Encounter for surveillance of injectable contraceptive: Secondary | ICD-10-CM

## 2016-02-08 MED ORDER — MEDROXYPROGESTERONE ACETATE 150 MG/ML IM SUSP
150.0000 mg | INTRAMUSCULAR | Status: AC
  Administered 2016-02-08 – 2016-10-17 (×2): 150 mg via INTRAMUSCULAR

## 2016-03-02 ENCOUNTER — Ambulatory Visit (INDEPENDENT_AMBULATORY_CARE_PROVIDER_SITE_OTHER): Payer: Managed Care, Other (non HMO) | Admitting: Family Medicine

## 2016-03-02 VITALS — BP 125/89 | HR 106 | Temp 98.5°F | Ht 62.0 in | Wt 222.4 lb

## 2016-03-02 DIAGNOSIS — F321 Major depressive disorder, single episode, moderate: Secondary | ICD-10-CM | POA: Diagnosis not present

## 2016-03-02 MED ORDER — FLUOXETINE HCL 20 MG PO CAPS: 20.0000 mg | ORAL_CAPSULE | Freq: Every day | ORAL | 3 refills | Status: DC

## 2016-03-02 NOTE — Patient Instructions (Signed)
We are going to start you on prozac for depression- start with 1 pill daily, and increase to 2 pills after 1-2 weeks.  If this medication is expensive, let me know and we can find an alternative medication  Please come and see me in about 1 month to check on your progress If you are not doing ok or are at any risk of self harm please seek help right away!    It was very nice to meet you today and I hope that you are feeling much better soon!
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
## 2016-03-02 NOTE — Progress Notes (Signed)
Pre visit review using our clinic review tool, if applicable. No additional management support is needed unless otherwise documented below in the visit note. 

## 2016-03-30 ENCOUNTER — Ambulatory Visit: Payer: Managed Care, Other (non HMO) | Admitting: Family Medicine

## 2016-04-24 ENCOUNTER — Ambulatory Visit (INDEPENDENT_AMBULATORY_CARE_PROVIDER_SITE_OTHER): Payer: Managed Care, Other (non HMO) | Admitting: Family Medicine

## 2016-04-24 ENCOUNTER — Encounter: Payer: Self-pay | Admitting: Family Medicine

## 2016-04-24 VITALS — BP 111/62 | HR 89 | Temp 98.3°F | Resp 18 | Ht 62.0 in | Wt 217.0 lb

## 2016-04-24 DIAGNOSIS — J111 Influenza due to unidentified influenza virus with other respiratory manifestations: Secondary | ICD-10-CM | POA: Diagnosis not present

## 2016-04-24 DIAGNOSIS — R05 Cough: Secondary | ICD-10-CM | POA: Diagnosis not present

## 2016-04-24 DIAGNOSIS — R059 Cough, unspecified: Secondary | ICD-10-CM

## 2016-04-24 LAB — POC INFLUENZA A&B (BINAX/QUICKVUE)
INFLUENZA A, POC: NEGATIVE
INFLUENZA B, POC: NEGATIVE

## 2016-04-24 MED ORDER — KETOROLAC TROMETHAMINE 60 MG/2ML IM SOLN
60.0000 mg | Freq: Once | INTRAMUSCULAR | Status: AC
Start: 2016-04-24 — End: 2016-04-24
  Administered 2016-04-24: 60 mg via INTRAMUSCULAR

## 2016-04-24 MED ORDER — BENZONATATE 100 MG PO CAPS: 100.0000 mg | ORAL_CAPSULE | Freq: Three times a day (TID) | ORAL | 0 refills | Status: DC | PRN

## 2016-04-24 NOTE — Patient Instructions (Signed)
No Motrin for the rest of the day.  Tylenol 1000 mg (2 extra strength tabs) every 6 hours as needed for muscle aches. Can return to Motrin tomorrow. OK to use with Tylenol.

## 2016-04-24 NOTE — Progress Notes (Signed)
Chief Complaint  Patient presents with  . Cough    Pt reports cough, body aches and fatigue x 1 week. Has pain in middle of abdomen. Son was confirmed flu on Tuesday. Has been taking ibuprofen and cold medication otc.    [name] [name] here for URI complaints.  Duration: 1 week  Associated symptoms: fevers (up to 101 F last week), muscle aches, cough, runny and stuffy nose, L ear pain, HA Denies: sinus pain, itchy watery eyes and sore throat Treatment to date: Tylenol  Sick contacts: Yes- son has flu  ROS:  Const: +fevers HEENT: As noted in HPI Lungs: No SOB  Past Medical History:  Diagnosis Date  . Abscess of buttock 12/2015   left  . Cat scratch 12/23/2015   upper lip  . GERD (gastroesophageal reflux disease)    no current med.  . Migraine    BP 111/62 (BP Location: Right Arm, Cuff Size: Large)   Pulse 89   Temp 98.3 F (36.8 C) (Oral)   Resp 18   Ht 5\' 2"  (1.575 m)   Wt 217 lb (98.4 kg)   SpO2 100% Comment: room air  BMI 39.69 kg/m  General: Awake, alert, appears stated age HEENT: AT, [place], ears patent b/l and TM's neg, nares patent w/o discharge, pharynx pink and without exudates, MMM Neck: No masses or asymmetry Heart: RRR, no murmurs, no bruits Lungs: CTAB, no accessory muscle use Psych: Age appropriate judgment and insight, normal mood and affect  Influenza - Plan: POC Influenza A&B(BINAX/QUICKVUE), benzonatate (TESSALON) 100 MG capsule, ketorolac (TORADOL) injection 60 mg  Cough  Orders as above. Likely has flu, outside of window for antiviral tx. Will give IM NSAID today. Continue NSAIDs and Tylenol for relief.  Letter for work given. Continue to push fluids, practice good hand hygiene, cover mouth when coughing. F/u in 1 week if symptoms worsen or fail to improve. Pt voiced understanding and agreement to the plan.  [place], DO 04/24/16 12:19 PM

## 2016-04-24 NOTE — Progress Notes (Signed)
Pre visit review using our clinic review tool, if applicable. No additional management support is needed unless otherwise documented below in the visit note. 

## 2016-04-27 ENCOUNTER — Telehealth: Payer: Self-pay | Admitting: Family Medicine

## 2016-04-27 ENCOUNTER — Emergency Department (HOSPITAL_COMMUNITY)
Admission: EM | Admit: 2016-04-27 | Discharge: 2016-04-27 | Disposition: A | Discharge status: Home / Self Care | Payer: Managed Care, Other (non HMO) | Attending: Emergency Medicine | Admitting: Emergency Medicine

## 2016-04-27 ENCOUNTER — Encounter (HOSPITAL_COMMUNITY): Payer: Self-pay | Admitting: Emergency Medicine

## 2016-04-27 ENCOUNTER — Emergency Department (HOSPITAL_COMMUNITY): Payer: Managed Care, Other (non HMO)

## 2016-04-27 DIAGNOSIS — R05 Cough: Secondary | ICD-10-CM | POA: Diagnosis not present

## 2016-04-27 DIAGNOSIS — R059 Cough, unspecified: Secondary | ICD-10-CM

## 2016-04-27 DIAGNOSIS — Z79899 Other long term (current) drug therapy: Secondary | ICD-10-CM | POA: Insufficient documentation

## 2016-04-27 DIAGNOSIS — R0602 Shortness of breath: Secondary | ICD-10-CM | POA: Insufficient documentation

## 2016-04-27 LAB — CBC
HCT: 37 % (ref 36.0–46.0)
Hemoglobin: 13 g/dL (ref 12.0–15.0)
MCH: 29 pg (ref 26.0–34.0)
MCHC: 35.1 g/dL (ref 30.0–36.0)
MCV: 82.4 fL (ref 78.0–100.0)
PLATELETS: 384 10*3/uL (ref 150–400)
RBC: 4.49 MIL/uL (ref 3.87–5.11)
RDW: 12.5 % (ref 11.5–15.5)
WBC: 13.9 10*3/uL — AB (ref 4.0–10.5)

## 2016-04-27 LAB — D-DIMER, QUANTITATIVE (NOT AT ARMC): D DIMER QUANT: 0.36 ug{FEU}/mL (ref 0.00–0.50)

## 2016-04-27 LAB — BASIC METABOLIC PANEL
Anion gap: 7 (ref 5–15)
BUN: 12 mg/dL (ref 6–20)
CALCIUM: 8.6 mg/dL — AB (ref 8.9–10.3)
CHLORIDE: 107 mmol/L (ref 101–111)
CO2: 22 mmol/L (ref 22–32)
CREATININE: 0.64 mg/dL (ref 0.44–1.00)
GFR calc Af Amer: >60 mL/min (ref >60)
Glucose, Bld: 103 mg/dL — ABNORMAL HIGH (ref 65–99)
Potassium: 3.7 mmol/L (ref 3.5–5.1)
SODIUM: 136 mmol/L (ref 135–145)

## 2016-04-27 LAB — I-STAT TROPONIN, ED: Troponin i, poc: 0 ng/mL (ref 0.00–0.08)

## 2016-04-27 MED ORDER — GUAIFENESIN-CODEINE 100-10 MG/5ML PO SOLN: 5.0000 mL | ORAL | 0 refills | Status: DC | PRN

## 2016-04-27 MED ORDER — ALBUTEROL SULFATE HFA 108 (90 BASE) MCG/ACT IN AERS: 1.0000 | INHALATION_SPRAY | Freq: Four times a day (QID) | RESPIRATORY_TRACT | 0 refills | Status: DC | PRN

## 2016-04-27 MED ORDER — IPRATROPIUM-ALBUTEROL 0.5-2.5 (3) MG/3ML IN SOLN
3.0000 mL | Freq: Once | RESPIRATORY_TRACT | Status: AC
  Administered 2016-04-27: 3 mL via RESPIRATORY_TRACT
  Filled 2016-04-27: qty 3

## 2016-04-27 MED ORDER — ALBUTEROL SULFATE (2.5 MG/3ML) 0.083% IN NEBU
5.0000 mg | INHALATION_SOLUTION | Freq: Once | RESPIRATORY_TRACT | Status: AC
  Administered 2016-04-27: 5 mg via RESPIRATORY_TRACT
  Filled 2016-04-27: qty 6

## 2016-04-27 MED ORDER — PREDNISONE 20 MG PO TABS
40.0000 mg | ORAL_TABLET | Freq: Once | ORAL | Status: AC
  Administered 2016-04-27: 40 mg via ORAL
  Filled 2016-04-27: qty 2

## 2016-04-27 MED ORDER — PREDNISONE 20 MG PO TABS: 40.0000 mg | ORAL_TABLET | Freq: Every day | ORAL | 0 refills | Status: DC

## 2016-04-27 NOTE — ED Triage Notes (Signed)
Respiratory therapy to complete tx

## 2016-04-27 NOTE — ED Triage Notes (Signed)
Pt reports cough for the past few days. Went to PCP for this 3 days ago and was diagnosed with the flu. Pt reports she is not feeling better and is now SOB and having CP.

## 2016-04-27 NOTE — Telephone Encounter (Signed)
Pt is currently in the ER. 

## 2016-04-27 NOTE — Telephone Encounter (Signed)
Pt currently in the ER

## 2016-04-27 NOTE — Telephone Encounter (Signed)
Ok to try Phenergan DM. TY.

## 2016-04-27 NOTE — Telephone Encounter (Signed)
PCP stated send nothing in since she is in the ER.

## 2016-04-27 NOTE — Telephone Encounter (Signed)
Relation to [id number] Call back [phone] Pharmacy: [facility], [place]. [phone] ([phone] (Fax)     Reason for call:  Patient last 04/24/16 states cough has not improved requesting Rx, please advise

## 2016-04-27 NOTE — Telephone Encounter (Signed)
Patient called back to follow up on this message. She states that she is getting drastically worse. Patient sounds very out of breath and like she is having a hard time breathing on the phone. She states she is having a hard time catching or taking a breath. Sent to [facility].

## 2016-04-27 NOTE — ED Provider Notes (Signed)
[facility] DEPT Provider Note   CSN: [id number] Arrival date & time: 04/27/16  1043   By signing my name below, I, [name], attest that this documentation has been prepared under the direction and in the presence of Melina Schools, PA-C. Electronically Signed: [name], ED Scribe. 04/27/2016. 1:27 PM.   History   Chief Complaint Chief Complaint  Patient presents with  . Cough  . Shortness of Breath    The history is provided by the patient. No language interpreter was used.   HPI Comments:  [name] [name] is a 31 y.o. female who presents to the Emergency Department complaining of constant SOB x 2 weeks. Pt was diagnosed with flu 3 days ago and was given tessalon pearls. Pt complains of associated nonproductive cough, rib pain, sore throat, generalized body aches, rhinorrhea, and chest pain x 3 days. Pt states that when she coughs it feels like she is choking, that her chest pain is made worse with coughing, and that she cannot catch her breath.  Pt denies any hx of respiratory problems and is not a smoker. Pt denies recent long travel, hx of DVT/PE, and uses depo for birth control. Pt reports sick contact with multiple family members at home. No alleviating factors noted. Pt denies vomiting, diarrhea, blood in cough.   Past Medical History:  Diagnosis Date  . Abscess of buttock 12/2015   left  . Cat scratch 12/23/2015   upper lip  . GERD (gastroesophageal reflux disease)    no current med.  . Migraine     Patient Active Problem List   Diagnosis Date Noted  . Moderate single current episode of major depressive disorder ([place]) 03/02/2016  . GERD without esophagitis 07/22/2013  . Allergic rhinitis, seasonal 07/22/2013  . Herpetic vulvovaginitis 06/26/2013  . Headache 06/26/2013  . Abnormal Pap smear of cervix   . Irregular periods     Past Surgical History:  Procedure Laterality Date  . IRRIGATION AND DEBRIDEMENT BUTTOCKS Left 12/28/2015   Procedure:  IRRIGATION AND DEBRIDEMENT BUTTOCKS ABSCESS CHRONIC;  Surgeon: Erroll Luna, MD;  Location: [facility];  Service: General;  Laterality: Left;    OB History    Gravida Para Term Preterm AB Living   1 1 1     1    SAB TAB Ectopic Multiple Live Births           1       Home Medications    Prior to Admission medications   Medication Sig Start Date End Date Taking? Authorizing Provider  benzonatate (TESSALON) 100 MG capsule Take 1 capsule (100 mg total) by mouth 3 (three) times daily as needed for cough. 04/24/16   [name], DO  FLUoxetine (PROZAC) 20 MG capsule Take 1 capsule (20 mg total) by mouth daily. Increase to 2 pills after 1-2 weeks 03/02/16   Darreld Mclean, MD  medroxyPROGESTERone (DEPO-PROVERA) 150 MG/ML injection Inject 1 mL (150 mg total) into the muscle every 3 (three) months. 11/16/15   Shelly Bombard, MD  valACYclovir (VALTREX) 1000 MG tablet Take 1 tablet (1,000 mg total) by mouth daily. 01/21/16   [name], DO    Family History History reviewed. No pertinent family history.  Social History Social History  Substance Use Topics  . Smoking status: Never Smoker  . Smokeless tobacco: Never Used  . Alcohol use Yes     Comment: occasionally     Allergies   Patient has no known allergies.  Review of Systems Review of Systems  HENT: Positive for rhinorrhea and sore throat.   Respiratory: Positive for cough and shortness of breath.   Cardiovascular: Positive for chest pain.  Gastrointestinal: Negative for diarrhea and vomiting.  Musculoskeletal: Positive for myalgias.  All other systems reviewed and are negative.    Physical Exam Updated Vital Signs BP 115/59   Pulse 90   Temp 98.9 F (37.2 C) (Oral)   Resp 16   SpO2 96%   Physical Exam  Constitutional: She appears well-developed and well-nourished. No distress.  HENT:  Head: Normocephalic and atraumatic.  Right Ear: Tympanic membrane, external ear and  ear canal normal.  Left Ear: Tympanic membrane, external ear and ear canal normal.  Nose: Mucosal edema and rhinorrhea present.  Mouth/Throat: Uvula is midline. Posterior oropharyngeal erythema present. No oropharyngeal exudate, posterior oropharyngeal edema or tonsillar abscesses.  Eyes: Conjunctivae are normal.  Neck: Normal range of motion. Neck supple.  Cardiovascular: Normal rate, regular rhythm, normal heart sounds and intact distal pulses.   Pulmonary/Chest: Effort normal and breath sounds normal. She exhibits no tenderness.  CTAB  Abdominal: Soft. Bowel sounds are normal. She exhibits no distension.  Musculoskeletal: Normal range of motion.  Lymphadenopathy:    She has no cervical adenopathy.  Neurological: She is alert.  Skin: Skin is warm and dry. Capillary refill takes less than 2 seconds.  Psychiatric: She has a normal mood and affect.  Nursing note and vitals reviewed.    ED Treatments / Results  DIAGNOSTIC STUDIES:  Oxygen Saturation is 96% on RA, normal by my interpretation.    COORDINATION OF CARE:  1:26 PM Will order chest CT if necessary. Will prescribe steroids, albuterol, and hydrocodone. Discussed treatment plan with pt at bedside and pt agreed to plan.   Labs (all labs ordered are listed, but only abnormal results are displayed) Labs Reviewed  BASIC METABOLIC PANEL - Abnormal; Notable for the following:       Result Value   Glucose, Bld 103 (*)    Calcium 8.6 (*)    All other components within normal limits  CBC - Abnormal; Notable for the following:    WBC 13.9 (*)    All other components within normal limits  D-DIMER, QUANTITATIVE (NOT AT [facility])  I-STAT TROPOININ, ED    EKG  EKG Interpretation  Date/Time:  Thursday April 27 2016 11:13:21 EST Ventricular Rate:  97 PR Interval:    QRS Duration: 81 QT Interval:  338 QTC Calculation: 430 R Axis:   60 Text Interpretation:  Sinus rhythm Borderline short PR interval No old tracing to compare  Confirmed by KNAPP  MD-J, JON [id number]) on 04/28/2016 11:01:07 AM       Radiology Dg Chest 2 View  Result Date: 04/27/2016 CLINICAL DATA:  Cough, congestion, shortness of breath and chest pain for 5 days. EXAM: CHEST  2 VIEW COMPARISON:  PA and lateral chest 07/18/2011 FINDINGS: Lungs are clear. Heart size is normal. No pneumothorax or pleural effusion. No focal bony abnormality. IMPRESSION: Negative chest. Electronically Signed   By: Inge Rise M.D.   On: 04/27/2016 11:38    Procedures Procedures (including critical care time)  Medications Ordered in ED Medications  ipratropium-albuterol (DUONEB) 0.5-2.5 (3) MG/3ML nebulizer solution 3 mL (3 mLs Nebulization Given 04/27/16 1353)  predniSONE (DELTASONE) tablet 40 mg (40 mg Oral Given 04/27/16 1353)  albuterol (PROVENTIL) (2.5 MG/3ML) 0.083% nebulizer solution 5 mg (5 mg Nebulization Given 04/27/16 1428)     Initial Impression /  Assessment and Plan / ED Course  I have reviewed the triage vital signs and the nursing notes.  Pertinent labs & imaging results that were available during my care of the patient were reviewed by me and considered in my medical decision making (see chart for details).    Pt presents to the ED with complaints of flui like symptoms, cp, and sob. Symptoms have been ongoing for 1 week. Patient is PERC negative. EKG without any acute changes. CXR without any focal infiltrate. HEART Pathway score is 1. Troponin negative. D dimer negative. Low suspicion for PE or ACS. Mild leukocytosis. All other labs are unremarkable. Lungs CTAB. Likely a viral bronchtitis. Pt given duo neb x2 in the ED with sign relief in her symptoms. Will be dc with steroids, albuterol inhaler, and cough medicine. Vs are stable. Pt is hemodynamically stable, in NAD, & able to ambulate in the ED. Pain has been managed & has no complaints prior to dc. Pt is comfortable with above plan and is stable for discharge at this time. All questions were answered  prior to disposition. Strict return precautions for f/u to the ED were discussed.   Final Clinical Impressions(s) / ED Diagnoses   Final diagnoses:  Cough  Shortness of breath    New Prescriptions Discharge Medication List as of 04/27/2016  3:16 PM    START taking these medications   Details  albuterol (PROVENTIL HFA;VENTOLIN HFA) 108 (90 Base) MCG/ACT inhaler Inhale 1-2 puffs into the lungs every 6 (six) hours as needed for wheezing or shortness of breath., Starting Thu 04/27/2016, Print    guaiFENesin-codeine 100-10 MG/5ML syrup Take 5 mLs by mouth every 4 (four) hours as needed for cough., Starting Thu 04/27/2016, Print    predniSONE (DELTASONE) 20 MG tablet Take 2 tablets (40 mg total) by mouth daily with breakfast., Starting Thu 04/27/2016, Print      I personally performed the services described in this documentation, which was scribed in my presence. The recorded information has been reviewed and is accurate.     Doristine Devoid, PA-C 05/01/16 1218    Isla Pence, MD 05/01/16 [phone]

## 2016-04-27 NOTE — Telephone Encounter (Signed)
Patient Name: [name] [name]  DOB: [birth year]    Initial Comment Caller having shortness of breath anda bad cough    Nurse Assessment  Nurse: Raphael Gibney, RN, Vera Date/Time (Eastern Time): 04/27/2016 10:03:24 AM  Confirm and document reason for call. If symptomatic, describe symptoms. ---Caller states she has non productive and she is SOB. Sounds SOB on the phone. Having chest pain. has been taking cough medication and had toredol in the office on Monday. Pain and cough are worse today. No fever.  Does the patient have any new or worsening symptoms? ---Yes  Will a triage be completed? ---Yes  Related visit to physician within the last 2 weeks? ---Yes  Does the PT have any chronic conditions? (i.e. diabetes, asthma, etc.) ---No  Is the patient pregnant or possibly pregnant? (Ask all females between the ages of 14-55) ---No  Is this a behavioral health or substance abuse call? ---No     Guidelines    Guideline Title Affirmed Question Affirmed Notes  Cough - Acute Non-Productive Difficulty breathing    Final Disposition User   Go to ED Now Raphael Gibney, RN, Vera    Referrals  GO TO FACILITY OTHER - SPECIFY   Disagree/Comply: Comply

## 2016-04-27 NOTE — Discharge Instructions (Signed)
All of your lab work and imaging has been normal today. Use the albuterol inhaler as needed. Take the cough syrup as prescribed. Take the prednisone starting tomorrow for the next 3 days. This is likely a viral bronchitis. Follow-up with your primary care doctor if her symptoms do not improve. Return to the ED if he develops any worsening symptoms. Take Motrin and Tylenol for pain.

## 2016-04-28 ENCOUNTER — Telehealth: Payer: Self-pay

## 2016-04-28 NOTE — Telephone Encounter (Signed)
Follow up call made to patient. States she was seen in ER yesterday.

## 2016-05-01 ENCOUNTER — Encounter: Payer: Self-pay | Admitting: *Deleted

## 2016-05-01 ENCOUNTER — Ambulatory Visit (INDEPENDENT_AMBULATORY_CARE_PROVIDER_SITE_OTHER): Payer: Managed Care, Other (non HMO)

## 2016-05-01 DIAGNOSIS — Z3042 Encounter for surveillance of injectable contraceptive: Secondary | ICD-10-CM | POA: Diagnosis not present

## 2016-05-01 MED ORDER — MEDROXYPROGESTERONE ACETATE 150 MG/ML IM SUSP
150.0000 mg | Freq: Once | INTRAMUSCULAR | Status: AC
  Administered 2016-05-01: 150 mg via INTRAMUSCULAR

## 2016-05-01 NOTE — Progress Notes (Signed)
Nurse visit for Depo. Pt is on time for injection. Depo given LD w/o difficulty. Next Depo due 07/23/16.

## 2016-07-24 ENCOUNTER — Ambulatory Visit: Payer: Medicaid Other

## 2016-08-01 ENCOUNTER — Ambulatory Visit (INDEPENDENT_AMBULATORY_CARE_PROVIDER_SITE_OTHER): Payer: Managed Care, Other (non HMO)

## 2016-08-01 ENCOUNTER — Other Ambulatory Visit: Payer: Self-pay | Admitting: *Deleted

## 2016-08-01 VITALS — BP 123/72 | HR 80

## 2016-08-01 DIAGNOSIS — Z3042 Encounter for surveillance of injectable contraceptive: Secondary | ICD-10-CM

## 2016-08-01 DIAGNOSIS — Z30013 Encounter for initial prescription of injectable contraceptive: Secondary | ICD-10-CM

## 2016-08-01 MED ORDER — MEDROXYPROGESTERONE ACETATE 150 MG/ML IM SUSP: 150.0000 mg | INTRAMUSCULAR | 0 refills | Status: DC

## 2016-08-01 NOTE — Telephone Encounter (Signed)
Pt will need a refill for DEPO before next appt, she is scheduled for 07/17 for next shot and annual is not due until after 11/15/2016.Marland KitchenMarland KitchenMarland Kitchen

## 2016-08-01 NOTE — Progress Notes (Signed)
Patient presents for Depo Provera visit. Kathrene Alu RNBSN

## 2016-08-01 NOTE — Telephone Encounter (Signed)
RF sent to [facility]. Pt aware.

## 2016-10-17 ENCOUNTER — Ambulatory Visit (INDEPENDENT_AMBULATORY_CARE_PROVIDER_SITE_OTHER): Payer: Managed Care, Other (non HMO)

## 2016-10-17 DIAGNOSIS — Z3042 Encounter for surveillance of injectable contraceptive: Secondary | ICD-10-CM | POA: Diagnosis not present

## 2016-10-17 NOTE — Progress Notes (Signed)
Patient here for Depo Provera injection. Patient needs annual exam and will schedule at check out. Patient supplied medication. Kathrene Alu RNBSN

## 2016-11-16 ENCOUNTER — Ambulatory Visit: Payer: Managed Care, Other (non HMO) | Admitting: Obstetrics

## 2016-12-05 ENCOUNTER — Other Ambulatory Visit (HOSPITAL_COMMUNITY)
Admission: RE | Admit: 2016-12-05 | Discharge: 2016-12-05 | Disposition: A | Discharge status: Home / Self Care | Payer: Managed Care, Other (non HMO) | Source: Ambulatory Visit | Attending: Obstetrics | Admitting: Obstetrics

## 2016-12-05 ENCOUNTER — Ambulatory Visit (INDEPENDENT_AMBULATORY_CARE_PROVIDER_SITE_OTHER): Payer: Managed Care, Other (non HMO) | Admitting: Obstetrics

## 2016-12-05 ENCOUNTER — Encounter: Payer: Self-pay | Admitting: Obstetrics

## 2016-12-05 VITALS — BP 148/85 | HR 89 | Wt 221.8 lb

## 2016-12-05 DIAGNOSIS — Z01419 Encounter for gynecological examination (general) (routine) without abnormal findings: Secondary | ICD-10-CM | POA: Insufficient documentation

## 2016-12-05 DIAGNOSIS — Z3042 Encounter for surveillance of injectable contraceptive: Secondary | ICD-10-CM

## 2016-12-05 DIAGNOSIS — N898 Other specified noninflammatory disorders of vagina: Secondary | ICD-10-CM

## 2016-12-05 NOTE — Progress Notes (Signed)
Patient is in the office for annual exam. Patient plans to continue her Depo for birth control.

## 2016-12-06 ENCOUNTER — Encounter: Payer: Self-pay | Admitting: Obstetrics

## 2016-12-06 NOTE — Progress Notes (Signed)
Subjective:        [name] [name] is a 31 y.o. female here for a routine exam.  Current complaints: None.    Personal health questionnaire:  Is patient [name], have a family history of breast and/or ovarian cancer: no Is there a family history of uterine cancer diagnosed at age < 21, gastrointestinal cancer, urinary tract cancer, family member who is a [name]: no Is the patient overweight and hypertensive, family history of diabetes, personal history of gestational diabetes, preeclampsia or PCOS: no Is patient over 68, have PCOS,  family history of premature CHD under age 59, diabetes, smoke, have hypertension or peripheral artery disease:  no At any time, has a partner hit, kicked or otherwise hurt or frightened you?: no Over the past 2 weeks, have you felt down, depressed or hopeless?: no Over the past 2 weeks, have you felt little interest or pleasure in doing things?:no   Gynecologic History No LMP recorded. Patient has had an injection. Contraception: Depo-Provera injections Last Pap: 2017. Results were: normal Last mammogram: n/a. Results were: n/a  Obstetric History OB History  Gravida Para Term Preterm AB Living  1 1 1     1   SAB TAB Ectopic Multiple Live Births          1    # Outcome Date GA Lbr Len/2nd Weight Sex Delivery Anes PTL Lv  1 Term 08/16/13 [redacted]w[redacted]d 23:40 / 00:38 6 lb 14 oz (3.118 kg) M Vag-Spont EPI  LIV      Past Medical History:  Diagnosis Date  . Abscess of buttock 12/2015   left  . Cat scratch 12/23/2015   upper lip  . GERD (gastroesophageal reflux disease)    no current med.  . Migraine     Past Surgical History:  Procedure Laterality Date  . IRRIGATION AND DEBRIDEMENT BUTTOCKS Left 12/28/2015   Procedure: IRRIGATION AND DEBRIDEMENT BUTTOCKS ABSCESS CHRONIC;  Surgeon: Erroll Luna, MD;  Location: [facility];  Service: General;  Laterality: Left;     Current Outpatient Prescriptions:  .   albuterol (PROVENTIL HFA;VENTOLIN HFA) 108 (90 Base) MCG/ACT inhaler, Inhale 1-2 puffs into the lungs every 6 (six) hours as needed for wheezing or shortness of breath., Disp: 1 Inhaler, Rfl: 0 .  FLUoxetine (PROZAC) 20 MG capsule, Take 1 capsule (20 mg total) by mouth daily. Increase to 2 pills after 1-2 weeks, Disp: 60 capsule, Rfl: 3 .  medroxyPROGESTERone (DEPO-PROVERA) 150 MG/ML injection, Inject 1 mL (150 mg total) into the muscle every 3 (three) months., Disp: 1 mL, Rfl: 0 .  valACYclovir (VALTREX) 1000 MG tablet, Take 1 tablet (1,000 mg total) by mouth daily., Disp: 30 tablet, Rfl: 1 No Known Allergies  Social History  Substance Use Topics  . Smoking status: Never Smoker  . Smokeless tobacco: Never Used  . Alcohol use Yes     Comment: occasionally    Family History  Problem Relation Age of Onset  . Alcoholism Father   . Epilepsy Father   . Heart disease Father       Review of Systems  Constitutional: negative for fatigue and weight loss Respiratory: negative for cough and wheezing Cardiovascular: negative for chest pain, fatigue and palpitations Gastrointestinal: negative for abdominal pain and change in bowel habits Musculoskeletal:negative for myalgias Neurological: negative for gait problems and tremors Behavioral/Psych: negative for abusive relationship, depression Endocrine: negative for temperature intolerance    Genitourinary:negative for abnormal menstrual periods, genital lesions, hot flashes, sexual  problems and vaginal discharge Integument/breast: negative for breast lump, breast tenderness, nipple discharge and skin lesion(s)    Objective:       BP (!) 148/85   Pulse 89   Wt 221 lb 12.8 oz (100.6 kg)   BMI 40.57 kg/m  General:   alert  Skin:   no rash or abnormalities  Lungs:   clear to auscultation bilaterally  Heart:   regular rate and rhythm, S1, S2 normal, no murmur, click, rub or gallop  Breasts:   normal without suspicious masses, skin or nipple  changes or axillary nodes  Abdomen:  normal findings: no organomegaly, soft, non-tender and no hernia  Pelvis:  External genitalia: normal general appearance Urinary system: urethral meatus normal and bladder without fullness, nontender Vaginal: normal without tenderness, induration or masses Cervix: normal appearance Adnexa: normal bimanual exam Uterus: anteverted and non-tender, normal size   Lab Review Urine pregnancy test Labs reviewed yes Radiologic studies reviewed no  50% of 20 min visit spent on counseling and coordination of care.    Assessment:     1. Encounter for annual routine gynecological examination Rx: - Cytology - PAP - Cervicovaginal ancillary only  2. Vaginal discharge Rx: - Cervicovaginal ancillary only  3. Surveillance of contraceptive injection - pleased with Depo Provera - Ca++, Mg++, and Vitamin D supplementation recommended for bony integrity   Plan:    Education reviewed: calcium supplements, depression evaluation, low fat, low cholesterol diet, safe sex/STD prevention, self breast exams and weight bearing exercise. Contraception: Depo-Provera injections. Follow up in: 1 year.   No orders of the defined types were placed in this encounter.  No orders of the defined types were placed in this encounter.

## 2016-12-07 LAB — CERVICOVAGINAL ANCILLARY ONLY
BACTERIAL VAGINITIS: NEGATIVE
CANDIDA VAGINITIS: POSITIVE — AB
CHLAMYDIA, DNA PROBE: NEGATIVE
Neisseria Gonorrhea: NEGATIVE
TRICH (WINDOWPATH): NEGATIVE

## 2016-12-08 ENCOUNTER — Other Ambulatory Visit: Payer: Self-pay | Admitting: Obstetrics

## 2016-12-08 DIAGNOSIS — B373 Candidiasis of vulva and vagina: Secondary | ICD-10-CM

## 2016-12-08 DIAGNOSIS — B3731 Acute candidiasis of vulva and vagina: Secondary | ICD-10-CM

## 2016-12-08 LAB — CYTOLOGY - PAP
Diagnosis: NEGATIVE
HPV: NOT DETECTED

## 2016-12-08 MED ORDER — FLUCONAZOLE 150 MG PO TABS: 150.0000 mg | ORAL_TABLET | Freq: Once | ORAL | 0 refills | Status: AC

## 2016-12-20 ENCOUNTER — Telehealth: Payer: Self-pay | Admitting: Family Medicine

## 2016-12-20 DIAGNOSIS — L0291 Cutaneous abscess, unspecified: Secondary | ICD-10-CM

## 2016-12-20 NOTE — Telephone Encounter (Signed)
Relation to EL:TRVU  Call back number:[phone]  Reason for call:  Patient requesting referral to [facility] for para anal abscess, patient states she was referred last year and now symptoms are reoccurring and specialist is asking for referral, please advise

## 2016-12-20 NOTE — Telephone Encounter (Signed)
Order placed.    TY

## 2016-12-21 ENCOUNTER — Encounter (HOSPITAL_COMMUNITY): Admission: AD | Disposition: A | Discharge status: Home / Self Care | Payer: Self-pay | Source: Ambulatory Visit

## 2016-12-21 ENCOUNTER — Encounter (HOSPITAL_COMMUNITY): Payer: Self-pay | Admitting: *Deleted

## 2016-12-21 ENCOUNTER — Observation Stay (HOSPITAL_COMMUNITY): Payer: Managed Care, Other (non HMO) | Admitting: Anesthesiology

## 2016-12-21 ENCOUNTER — Observation Stay (HOSPITAL_COMMUNITY)
Admission: AD | Admit: 2016-12-21 | Discharge: 2016-12-22 | Disposition: A | Discharge status: Home / Self Care | Payer: Managed Care, Other (non HMO) | Source: Ambulatory Visit | Attending: General Surgery | Admitting: General Surgery

## 2016-12-21 DIAGNOSIS — Z885 Allergy status to narcotic agent status: Secondary | ICD-10-CM | POA: Diagnosis not present

## 2016-12-21 DIAGNOSIS — K6289 Other specified diseases of anus and rectum: Secondary | ICD-10-CM | POA: Diagnosis present

## 2016-12-21 DIAGNOSIS — K611 Rectal abscess: Secondary | ICD-10-CM | POA: Diagnosis not present

## 2016-12-21 HISTORY — PX: INCISION AND DRAINAGE PERIRECTAL ABSCESS: SHX1804

## 2016-12-21 LAB — BASIC METABOLIC PANEL
Anion gap: 8 (ref 5–15)
BUN: 8 mg/dL (ref 6–20)
CHLORIDE: 108 mmol/L (ref 101–111)
CO2: 21 mmol/L — AB (ref 22–32)
CREATININE: 0.74 mg/dL (ref 0.44–1.00)
Calcium: 8.4 mg/dL — ABNORMAL LOW (ref 8.9–10.3)
GFR calc Af Amer: >60 mL/min (ref >60)
GFR calc non Af Amer: >60 mL/min (ref >60)
GLUCOSE: 133 mg/dL — AB (ref 65–99)
POTASSIUM: 3.7 mmol/L (ref 3.5–5.1)
SODIUM: 137 mmol/L (ref 135–145)

## 2016-12-21 LAB — CBC
HEMATOCRIT: 37.1 % (ref 36.0–46.0)
HEMOGLOBIN: 13.1 g/dL (ref 12.0–15.0)
MCH: 28.8 pg (ref 26.0–34.0)
MCHC: 35.3 g/dL (ref 30.0–36.0)
MCV: 81.5 fL (ref 78.0–100.0)
Platelets: 292 10*3/uL (ref 150–400)
RBC: 4.55 MIL/uL (ref 3.87–5.11)
RDW: 12.6 % (ref 11.5–15.5)
WBC: 15.3 10*3/uL — ABNORMAL HIGH (ref 4.0–10.5)

## 2016-12-21 LAB — URINALYSIS, ROUTINE W REFLEX MICROSCOPIC
Bilirubin Urine: NEGATIVE
GLUCOSE, UA: NEGATIVE mg/dL
KETONES UR: 5 mg/dL — AB
Nitrite: NEGATIVE
PROTEIN: 30 mg/dL — AB
Specific Gravity, Urine: 1.021 (ref 1.005–1.030)
pH: 5 (ref 5.0–8.0)

## 2016-12-21 LAB — SURGICAL PCR SCREEN
MRSA, PCR: NEGATIVE
Staphylococcus aureus: NEGATIVE

## 2016-12-21 LAB — PREGNANCY, URINE: Preg Test, Ur: NEGATIVE

## 2016-12-21 SURGERY — INCISION AND DRAINAGE, ABSCESS, PERIRECTAL
Anesthesia: General | Site: Buttocks | Laterality: Left

## 2016-12-21 MED ORDER — ONDANSETRON HCL 4 MG/2ML IJ SOLN: 4.0000 mg | Freq: Four times a day (QID) | INTRAMUSCULAR | Status: DC | PRN

## 2016-12-21 MED ORDER — CIPROFLOXACIN IN D5W 400 MG/200ML IV SOLN
INTRAVENOUS | Status: AC
  Filled 2016-12-21: qty 200

## 2016-12-21 MED ORDER — DIPHENHYDRAMINE HCL 50 MG/ML IJ SOLN
25.0000 mg | Freq: Four times a day (QID) | INTRAMUSCULAR | Status: DC | PRN
Start: 2016-12-21 — End: 2016-12-21

## 2016-12-21 MED ORDER — PROMETHAZINE HCL 25 MG/ML IJ SOLN: 6.2500 mg | INTRAMUSCULAR | Status: DC | PRN

## 2016-12-21 MED ORDER — ACETAMINOPHEN 325 MG PO TABS: 650.0000 mg | ORAL_TABLET | Freq: Four times a day (QID) | ORAL | Status: DC | PRN

## 2016-12-21 MED ORDER — NEOSTIGMINE METHYLSULFATE 5 MG/5ML IV SOSY
PREFILLED_SYRINGE | INTRAVENOUS | Status: AC
  Filled 2016-12-21: qty 5

## 2016-12-21 MED ORDER — FENTANYL CITRATE (PF) 100 MCG/2ML IJ SOLN
25.0000 ug | INTRAMUSCULAR | Status: AC | PRN
  Administered 2016-12-21 (×6): 25 ug via INTRAVENOUS

## 2016-12-21 MED ORDER — OXYCODONE-ACETAMINOPHEN 5-325 MG PO TABS
1.0000 | ORAL_TABLET | Freq: Four times a day (QID) | ORAL | Status: DC | PRN
  Administered 2016-12-21 – 2016-12-22 (×4): 2 via ORAL
  Filled 2016-12-21 (×4): qty 2

## 2016-12-21 MED ORDER — MIDAZOLAM HCL 2 MG/2ML IJ SOLN
INTRAMUSCULAR | Status: AC
  Filled 2016-12-21: qty 2

## 2016-12-21 MED ORDER — ROCURONIUM BROMIDE 50 MG/5ML IV SOSY
PREFILLED_SYRINGE | INTRAVENOUS | Status: AC
  Filled 2016-12-21: qty 5

## 2016-12-21 MED ORDER — ONDANSETRON 4 MG PO TBDP: 4.0000 mg | ORAL_TABLET | Freq: Four times a day (QID) | ORAL | Status: DC | PRN

## 2016-12-21 MED ORDER — DIPHENHYDRAMINE HCL 25 MG PO CAPS: 25.0000 mg | ORAL_CAPSULE | Freq: Four times a day (QID) | ORAL | Status: DC | PRN

## 2016-12-21 MED ORDER — LIDOCAINE 2% (20 MG/ML) 5 ML SYRINGE
INTRAMUSCULAR | Status: DC | PRN
  Administered 2016-12-21: 100 mg via INTRAVENOUS

## 2016-12-21 MED ORDER — 0.9 % SODIUM CHLORIDE (POUR BTL) OPTIME
TOPICAL | Status: DC | PRN
  Administered 2016-12-21: 1000 mL

## 2016-12-21 MED ORDER — BUPIVACAINE-EPINEPHRINE (PF) 0.25% -1:200000 IJ SOLN
INTRAMUSCULAR | Status: AC
  Filled 2016-12-21: qty 30

## 2016-12-21 MED ORDER — ACETAMINOPHEN 650 MG RE SUPP
650.0000 mg | Freq: Four times a day (QID) | RECTAL | Status: DC | PRN
Start: 2016-12-21 — End: 2016-12-21

## 2016-12-21 MED ORDER — HYDRALAZINE HCL 20 MG/ML IJ SOLN: 10.0000 mg | INTRAMUSCULAR | Status: DC | PRN

## 2016-12-21 MED ORDER — FENTANYL CITRATE (PF) 100 MCG/2ML IJ SOLN
INTRAMUSCULAR | Status: AC
  Filled 2016-12-21: qty 2

## 2016-12-21 MED ORDER — LIDOCAINE 2% (20 MG/ML) 5 ML SYRINGE
INTRAMUSCULAR | Status: AC
  Filled 2016-12-21: qty 5

## 2016-12-21 MED ORDER — FENTANYL CITRATE (PF) 100 MCG/2ML IJ SOLN
INTRAMUSCULAR | Status: DC | PRN
  Administered 2016-12-21: 100 ug via INTRAVENOUS
  Administered 2016-12-21 (×2): 50 ug via INTRAVENOUS

## 2016-12-21 MED ORDER — SUCCINYLCHOLINE CHLORIDE 200 MG/10ML IV SOSY
PREFILLED_SYRINGE | INTRAVENOUS | Status: AC
  Filled 2016-12-21: qty 10

## 2016-12-21 MED ORDER — CIPROFLOXACIN IN D5W 400 MG/200ML IV SOLN
400.0000 mg | Freq: Two times a day (BID) | INTRAVENOUS | Status: DC
  Administered 2016-12-21: 400 mg via INTRAVENOUS

## 2016-12-21 MED ORDER — PANTOPRAZOLE SODIUM 40 MG IV SOLR: 40.0000 mg | Freq: Every day | INTRAVENOUS | Status: DC

## 2016-12-21 MED ORDER — FENTANYL CITRATE (PF) 250 MCG/5ML IJ SOLN
INTRAMUSCULAR | Status: AC
  Filled 2016-12-21: qty 5

## 2016-12-21 MED ORDER — MIDAZOLAM HCL 2 MG/2ML IJ SOLN
INTRAMUSCULAR | Status: DC | PRN
  Administered 2016-12-21: 2 mg via INTRAVENOUS

## 2016-12-21 MED ORDER — GLYCOPYRROLATE 0.2 MG/ML IV SOSY
PREFILLED_SYRINGE | INTRAVENOUS | Status: AC
  Filled 2016-12-21: qty 5

## 2016-12-21 MED ORDER — METRONIDAZOLE IN NACL 5-0.79 MG/ML-% IV SOLN
500.0000 mg | Freq: Three times a day (TID) | INTRAVENOUS | Status: DC
  Administered 2016-12-21: 500 mg via INTRAVENOUS
  Filled 2016-12-21 (×2): qty 100

## 2016-12-21 MED ORDER — IBUPROFEN 800 MG PO TABS
800.0000 mg | ORAL_TABLET | Freq: Three times a day (TID) | ORAL | Status: DC | PRN
  Administered 2016-12-21: 800 mg via ORAL
  Filled 2016-12-21 (×2): qty 1

## 2016-12-21 MED ORDER — ONDANSETRON HCL 4 MG/2ML IJ SOLN
INTRAMUSCULAR | Status: AC
Start: 2016-12-21 — End: ?
  Filled 2016-12-21: qty 2

## 2016-12-21 MED ORDER — NEOSTIGMINE METHYLSULFATE 10 MG/10ML IV SOLN
INTRAVENOUS | Status: DC | PRN
  Administered 2016-12-21: 3 mg via INTRAVENOUS

## 2016-12-21 MED ORDER — FLUOXETINE HCL 20 MG PO CAPS
40.0000 mg | ORAL_CAPSULE | Freq: Every morning | ORAL | Status: DC
  Administered 2016-12-22: 40 mg via ORAL
  Filled 2016-12-21 (×3): qty 2

## 2016-12-21 MED ORDER — BUPIVACAINE-EPINEPHRINE (PF) 0.25% -1:200000 IJ SOLN
INTRAMUSCULAR | Status: DC | PRN
  Administered 2016-12-21: 20 mL

## 2016-12-21 MED ORDER — ONDANSETRON HCL 4 MG/2ML IJ SOLN
INTRAMUSCULAR | Status: DC | PRN
  Administered 2016-12-21: 4 mg via INTRAVENOUS

## 2016-12-21 MED ORDER — SODIUM CHLORIDE 0.9 % IV SOLN
INTRAVENOUS | Status: DC
  Administered 2016-12-21: 14:00:00 via INTRAVENOUS

## 2016-12-21 MED ORDER — PROPOFOL 10 MG/ML IV BOLUS
INTRAVENOUS | Status: DC | PRN
  Administered 2016-12-21: 200 mg via INTRAVENOUS

## 2016-12-21 MED ORDER — HYDROCODONE-ACETAMINOPHEN 5-325 MG PO TABS: 1.0000 | ORAL_TABLET | ORAL | Status: DC | PRN

## 2016-12-21 MED ORDER — ROCURONIUM BROMIDE 10 MG/ML (PF) SYRINGE
PREFILLED_SYRINGE | INTRAVENOUS | Status: DC | PRN
  Administered 2016-12-21: 30 mg via INTRAVENOUS

## 2016-12-21 MED ORDER — DEXAMETHASONE SODIUM PHOSPHATE 10 MG/ML IJ SOLN
INTRAMUSCULAR | Status: DC | PRN
  Administered 2016-12-21: 10 mg via INTRAVENOUS

## 2016-12-21 MED ORDER — LIP MEDEX EX OINT
TOPICAL_OINTMENT | CUTANEOUS | Status: AC
  Filled 2016-12-21: qty 7

## 2016-12-21 MED ORDER — PROPOFOL 10 MG/ML IV BOLUS
INTRAVENOUS | Status: AC
  Filled 2016-12-21: qty 20

## 2016-12-21 MED ORDER — GLYCOPYRROLATE 0.2 MG/ML IJ SOLN
INTRAMUSCULAR | Status: DC | PRN
  Administered 2016-12-21: 0.4 mg via INTRAVENOUS

## 2016-12-21 SURGICAL SUPPLY — 32 items
BLADE HEX COATED 2.75 (ELECTRODE) IMPLANT
BLADE SURG 15 STRL LF DISP TIS (BLADE) ×1 IMPLANT
BLADE SURG 15 STRL SS (BLADE) ×1
BRIEF STRETCH FOR OB PAD LRG (UNDERPADS AND DIAPERS) ×2 IMPLANT
DRAIN PENROSE 18X1/2 LTX STRL (DRAIN) IMPLANT
DRAIN PENROSE 18X1/4 LTX STRL (WOUND CARE) ×2 IMPLANT
DRAPE LAPAROTOMY T 102X78X121 (DRAPES) ×2 IMPLANT
DRSG PAD ABDOMINAL 8X10 ST (GAUZE/BANDAGES/DRESSINGS) IMPLANT
ELECT PENCIL ROCKER SW 15FT (MISCELLANEOUS) ×2 IMPLANT
ELECT REM PT RETURN 15FT ADLT (MISCELLANEOUS) ×2 IMPLANT
GAUZE SPONGE 4X4 12PLY STRL (GAUZE/BANDAGES/DRESSINGS) ×2 IMPLANT
GAUZE SPONGE 4X4 16PLY XRAY LF (GAUZE/BANDAGES/DRESSINGS) ×2 IMPLANT
GLOVE ECLIPSE 8.0 STRL XLNG CF (GLOVE) IMPLANT
GLOVE INDICATOR 8.0 STRL GRN (GLOVE) IMPLANT
GOWN STRL REUS W/TWL XL LVL3 (GOWN DISPOSABLE) ×4 IMPLANT
KIT BASIN OR (CUSTOM PROCEDURE TRAY) ×2 IMPLANT
LOOP VESSEL MAXI BLUE (MISCELLANEOUS) ×2 IMPLANT
LUBRICANT JELLY K Y 4OZ (MISCELLANEOUS) ×2 IMPLANT
MARKER SKIN DUAL TIP RULER LAB (MISCELLANEOUS) IMPLANT
NEEDLE HYPO 25X1 1.5 SAFETY (NEEDLE) ×2 IMPLANT
PACK LITHOTOMY IV (CUSTOM PROCEDURE TRAY) IMPLANT
PAD ABD 8X10 STRL (GAUZE/BANDAGES/DRESSINGS) ×2 IMPLANT
SPONGE SURGIFOAM ABS GEL 12-7 (HEMOSTASIS) IMPLANT
SUT CHROMIC 3 0 SH 27 (SUTURE) IMPLANT
SUT SILK 2 0 (SUTURE) ×1
SUT SILK 2-0 18XBRD TIE 12 (SUTURE) ×1 IMPLANT
SWAB COLLECTION DEVICE MRSA (MISCELLANEOUS) ×2 IMPLANT
SWAB CULTURE ESWAB REG 1ML (MISCELLANEOUS) ×2 IMPLANT
SYR CONTROL 10ML LL (SYRINGE) ×2 IMPLANT
TOWEL OR 17X26 10 PK STRL BLUE (TOWEL DISPOSABLE) ×2 IMPLANT
TOWEL OR NON WOVEN STRL DISP B (DISPOSABLE) ×2 IMPLANT
YANKAUER SUCT BULB TIP 10FT TU (MISCELLANEOUS) ×2 IMPLANT

## 2016-12-21 NOTE — Anesthesia Preprocedure Evaluation (Addendum)
Anesthesia Evaluation  Patient identified by MRN, date of birth, ID band Patient awake    Reviewed: Allergy & Precautions, NPO status , Patient's Chart, lab work & pertinent test results  Airway Mallampati: II  TM Distance: >3 FB     Dental  (+) Dental Advisory Given   Pulmonary    breath sounds clear to auscultation       Cardiovascular negative cardio ROS   Rhythm:Regular Rate:Normal     Neuro/Psych  Headaches, Depression    GI/Hepatic Neg liver ROS, GERD  Medicated and Controlled,  Endo/Other  Morbid obesity  Renal/GU negative Renal ROS     Musculoskeletal   Abdominal (+) + obese,   Peds  Hematology   Anesthesia Other Findings Perirectal abscess  Reproductive/Obstetrics                            Anesthesia Physical  Anesthesia Plan  ASA: III  Anesthesia Plan: General   Post-op Pain Management:    Induction: Intravenous  PONV Risk Score and Plan: 3 and Ondansetron, Dexamethasone, Midazolam, Propofol infusion and Treatment may vary due to age or medical condition  Airway Management Planned: Oral ETT  Additional Equipment: None  Intra-op Plan:   Post-operative Plan: Extubation in OR  Informed Consent:   Dental advisory given  Plan Discussed with: CRNA  Anesthesia Plan Comments:        Anesthesia Quick Evaluation

## 2016-12-21 NOTE — Anesthesia Procedure Notes (Signed)
Procedure Name: Intubation Date/Time: 12/21/2016 2:45 PM Performed by: Danley Danker L Patient Re-evaluated:Patient Re-evaluated prior to induction Oxygen Delivery Method: Circle system utilized Preoxygenation: Pre-oxygenation with 100% oxygen Induction Type: IV induction Ventilation: Mask ventilation without difficulty and Oral airway inserted - appropriate to patient size Laryngoscope Size: Miller and 2 Grade View: Grade I Tube type: Oral Tube size: 7.5 mm Number of attempts: 1 Airway Equipment and Method: Stylet Placement Confirmation: ETT inserted through vocal cords under direct vision,  positive ETCO2 and breath sounds checked- equal and bilateral Secured at: 20 cm Tube secured with: Tape Dental Injury: Teeth and Oropharynx as per pre-operative assessment

## 2016-12-21 NOTE — H&P (Signed)
[name] [name] 12/21/2016 9:35 AM Location: [facility] Patient #: [id number] DOB: 05/04/1985 Single / Language: [name] / Race: White Female   History of Present Illness  The patient is a 31 year old female who presents with anal pain. She is presenting to urgent office with a 4 day history of anal pain which has become worse in the past 2 days. Two years ago, she underwent 2 I&Ds in the ER and was placed on antibiotics but no purulent material was expelled either time. She then went to her primary care doctor who referred her to [facility]. She was seen by Dr. 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 No definite fistula was found, no pus was found, and the wound was debrided and packed. She followed up with him in the office until it was fully healed. She has not had any issues with this in the past year, until this week.  She states it is very sore to sit on the toilet and to put any pressure on the area. She denies drainage and states that this area never really drained in the past either. She has been using Tylenol and ibuprofen around-the-clock but yesterday she experienced subjective fever, chills, muscle aches, and nausea. She states she feels better today but is still experiencing achiness.  She denies constipation or diarrhea prior to this flare. However, she is now reluctant to have a bowel movement because of pain while sitting on the toilet. Denies pain during the actual bowel movement. LBM was this morning - normal color and soft.  Last oral intake was a handful of trail mix at 10 PM last night. She has only had water today. She is not on blood thinners and has no chronic medical conditions.   Allergies  No Known Drug Allergies 12/21/2016 (Marked as Inactive) Allergies Reconciled  Vicodin *ANALGESICS - OPIOID*   Medication History Medications  Reconciled  Vitals 12/21/2016 9:50 AM Weight: 219.2 lb Height: 62in Body Surface Area: 1.99 m Body Mass Index: 40.09 kg/m  Temp.: 98.20F  Pulse: 108 (Regular)  BP: 126/90 (Sitting, Left Arm, Standard)    Physical Exam  General Well developed, well nourished female Tearful and anxious  Rectal  LEFT medial buttock: - 4 cm area of erythema/cellulitis/induration and severe tenderness - Scar tissue/swelling present in the center of the erythema - No drainage at this time   Assessment & Plan LEFT BUTTOCK PAIN Impression: Dr. Rosendo Gros evaluated the patient with me. Given the amount of tenderness on physical exam, an attempt to incise and drain this area may not have been tolerated in the office. The patient agreed.  She was given the following options: 1. Try a course of antibiotics to monitor for symptom improvement and close follow-up 2. Undergo exam under anesthesia to evaluate whether there is a source of infection or another underlying condition  The patient chose exam under anesthesia. She will be sent to [facility] for direct admission and the procedure to follow. She was told to stay NPO and verbalized understanding.  Signed electronically by Kellie Shropshire, PA C (12/21/2016 12:35 PM)

## 2016-12-21 NOTE — Transfer of Care (Signed)
Immediate Anesthesia Transfer of Care Note  Patient: [name] [name]  Procedure(s) Performed: Procedure(s): PLACEMENT OF SETON INTO FISTULA  AND INCISION AND DRAINAGE OF PERIRECTAL ABCESS (Left)  Patient Location: PACU  Anesthesia Type:General  Level of Consciousness: awake and alert   Airway & Oxygen Therapy: Patient Spontanous Breathing and Patient connected to face mask oxygen  Post-op Assessment: Report given to RN and Post -op Vital signs reviewed and stable  Post vital signs: Reviewed and stable  Last Vitals:  Vitals:   12/21/16 1142 12/21/16 1210  BP: 129/74 129/74  Pulse: 100 100  Resp: 18   Temp: 37 C 37 C  SpO2: 99% 99%    Last Pain:  Vitals:   12/21/16 1210  TempSrc: Oral         Complications: No apparent anesthesia complications

## 2016-12-21 NOTE — Anesthesia Postprocedure Evaluation (Signed)
Anesthesia Post Note  Patient: [name] [name]  Procedure(s) Performed: Procedure(s) (LRB): PLACEMENT OF SETON INTO FISTULA  AND INCISION AND DRAINAGE OF PERIRECTAL ABCESS (Left)     Patient location during evaluation: PACU Anesthesia Type: General Level of consciousness: awake and alert Pain management: pain level controlled Vital Signs Assessment: post-procedure vital signs reviewed and stable Respiratory status: spontaneous breathing, nonlabored ventilation and respiratory function stable Cardiovascular status: blood pressure returned to baseline and stable Postop Assessment: no apparent nausea or vomiting Anesthetic complications: no    Last Vitals:  Vitals:   12/21/16 1610 12/21/16 1615  BP:  127/76  Pulse: 100 89  Resp: 20 (!) 21  Temp:  36.8 C  SpO2: 100% 100%    Last Pain:  Vitals:   12/21/16 1615  TempSrc:   PainSc: [place]an Hedstrom

## 2016-12-21 NOTE — Op Note (Signed)
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  Asst: none  Anesthesia: general  Indications for procedure: [name] [name] is a 31 y.o. year old female with symptoms of recurrent perirectal abscess. After discussing with patient plan was made to proceed with incision and drainage and exam under anesthesia for concern of fistula.  Description of procedure: The patient was brought into the operative suite. Anesthesia was administered with General endotracheal anesthesia. WHO checklist was applied. The patient was then placed in prone position. The area was prepped and draped in the usual sterile fashion.  Next, A stab incision was made over area of highest fluctuance and a large amount of purulence was drained. Cultures were sent. Next, the anus was examined with digital palpation and then retractors. There was a small amount of stool in the anterior midline anal canal and on further palpation there was an obvious internal fistulous opening. A coronary dilator was used to probe from the internal opening to the external  Opening and a vessel loop was brought through the passage to the incision stab incision and sutured in a loop with multiple 2-0 silk. Next, A counter incision was made at the most superior edge of the abscess cavity and a 1/4" penrose was passed between the incision stab incision and the superior stab. Blunt dissection was used to ensure no un-drained abscess remained.The penrose was sutured to itself in a loop with multiple 2-0 silks. Gauze and mesh panties were put in place for dressing.  Findings: perirectal abscess with internal fistulous opening leading into the abscess cavity.  Specimen: abscess for culture  Implant: vessel loop into fistula, 1/4" penrose looped in abscess cavity   Blood  loss: <53ml  Local anesthesia: 20 ml 3.4% marcaine   Complications: none  Gurney Maxin, M.D. General, Bariatric, & Minimally Invasive Surgery [facility]

## 2016-12-22 DIAGNOSIS — K611 Rectal abscess: Secondary | ICD-10-CM | POA: Diagnosis not present

## 2016-12-22 LAB — URINE CULTURE: Special Requests: NORMAL

## 2016-12-22 LAB — HIV ANTIBODY (ROUTINE TESTING W REFLEX): HIV SCREEN 4TH GENERATION: NONREACTIVE

## 2016-12-22 MED ORDER — OXYCODONE-ACETAMINOPHEN 5-325 MG PO TABS: 1.0000 | ORAL_TABLET | ORAL | 0 refills | Status: DC | PRN

## 2016-12-22 MED ORDER — HYDROCODONE-ACETAMINOPHEN 5-325 MG PO TABS: 1.0000 | ORAL_TABLET | Freq: Four times a day (QID) | ORAL | 0 refills | Status: DC | PRN

## 2016-12-22 MED ORDER — IBUPROFEN 800 MG PO TABS: 800.0000 mg | ORAL_TABLET | Freq: Three times a day (TID) | ORAL | 0 refills | Status: DC | PRN

## 2016-12-22 MED ORDER — AMOXICILLIN-POT CLAVULANATE 875-125 MG PO TABS: 1.0000 | ORAL_TABLET | Freq: Two times a day (BID) | ORAL | 0 refills | Status: DC

## 2016-12-22 MED ORDER — FLUCONAZOLE 100 MG PO TABS: ORAL_TABLET | ORAL | 0 refills | Status: DC

## 2016-12-22 NOTE — Progress Notes (Signed)
Pt alert and oriented.  D/C instructions and prescription given, all questions answered.

## 2016-12-22 NOTE — Discharge Summary (Signed)
Physician Discharge Summary  [name] [name] ZDG:[id number] DOB: Nov 11, 1985 DOA: 12/21/2016  PCP: [name], DO  Admit date: 12/21/2016 Discharge date: 12/22/2016  Recommendations for Outpatient Follow-up:  1. Sitz baths or showers x2 daily (include homehealth, outpatient follow-up instructions, specific recommendations for PCP to follow-up on, etc.)  [facility] Follow up in 2 week(s).   Specialty:  General Surgery Why:  nurse visit for yellow drain removal Contact information: [address] [place] [phone]       [name] [name], [name] Bruce, MD Follow up in 6 week(s).   Specialty:  General Surgery Contact information: [place] [address] [phone]          Discharge Diagnoses:  Active Problems:   Perianal pain   Surgical Procedure: incision and drainage of abscess, placement of seton into fistula in ano  Discharge Condition: Good Disposition: Home  Diet recommendation: reg diet   Hospital Course:  31 yo female sent from clinic for abscess underwent I+D and placement of seton for fistula. POD 1 she was doing well and tolerated a diet and was discharged home POD 1.  Discharge Instructions  Discharge Instructions    Call MD for:  difficulty breathing, headache or visual disturbances    Complete by:  As directed    Call MD for:  persistant nausea and vomiting    Complete by:  As directed    Call MD for:  redness, tenderness, or signs of infection (pain, swelling, redness, odor or green/yellow discharge around incision site)    Complete by:  As directed    Call MD for:  severe uncontrolled pain    Complete by:  As directed    Call MD for:  temperature >100.4    Complete by:  As directed    Diet - low sodium heart healthy    Complete by:  As directed    Discharge wound care:    Complete by:  As directed    Shower or sitz baths  2x daily   Increase activity slowly    Complete by:  As directed      Allergies as of 12/22/2016      Reactions   Cinnamon Other (See Comments)   Blisters on mouth and lips, coughing.        Medication List    STOP taking these medications   acetaminophen 500 MG tablet Commonly known as:  TYLENOL     TAKE these medications   albuterol 108 (90 Base) MCG/ACT inhaler Commonly known as:  PROVENTIL HFA;VENTOLIN HFA Inhale 1-2 puffs into the lungs every 6 (six) hours as needed for wheezing or shortness of breath.   amoxicillin-clavulanate 875-125 MG tablet Commonly known as:  AUGMENTIN Take 1 tablet by mouth every 12 (twelve) hours.   FLUoxetine 20 MG capsule Commonly known as:  PROZAC Take 1 capsule (20 mg total) by mouth daily. Increase to 2 pills after 1-2 weeks What changed:  how much to take  when to take this  additional instructions   HYDROcodone-acetaminophen 5-325 MG tablet Commonly known as:  NORCO/VICODIN Take 1-2 tablets by mouth every 6 (six) hours as needed for moderate pain.   ibuprofen 800 MG tablet Commonly known as:  ADVIL,MOTRIN Take 1 tablet (800 mg total) by mouth every 8 (eight) hours as needed. What changed:  medication strength  when to take this  reasons to take this  medroxyPROGESTERone 150 MG/ML injection Commonly known as:  DEPO-PROVERA Inject 1 mL (150 mg total) into the muscle every 3 (three) months.   valACYclovir 1000 MG tablet Commonly known as:  VALTREX Take 1 tablet (1,000 mg total) by mouth daily. What changed:  when to take this  additional instructions            Discharge Care Instructions        Start     Ordered   12/22/16 0000  Diet - low sodium heart healthy     12/22/16 0801   12/22/16 0000  Increase activity slowly     12/22/16 0801   12/22/16 0000  Discharge wound care:    Comments:  Shower or sitz baths 2x daily   12/22/16 0801   12/22/16 0000  Call MD for:  temperature >100.4     12/22/16  0801   12/22/16 0000  Call MD for:  persistant nausea and vomiting     12/22/16 0801   12/22/16 0000  Call MD for:  severe uncontrolled pain     12/22/16 0801   12/22/16 0000  Call MD for:  redness, tenderness, or signs of infection (pain, swelling, redness, odor or green/yellow discharge around incision site)     12/22/16 0801   12/22/16 0000  Call MD for:  difficulty breathing, headache or visual disturbances     12/22/16 0801   12/22/16 0000  HYDROcodone-acetaminophen (NORCO/VICODIN) 5-325 MG tablet  Every 6 hours PRN     12/22/16 0801   12/22/16 0000  ibuprofen (ADVIL,MOTRIN) 800 MG tablet  Every 8 hours PRN     12/22/16 0801   12/22/16 0000  amoxicillin-clavulanate (AUGMENTIN) 875-125 MG tablet  Every 12 hours     12/22/16 0801     Follow-up [facility] Follow up in 2 week(s).   Specialty:  General Surgery Why:  nurse visit for yellow drain removal Contact information: [address] [place] [phone]       [name] [name], [name] Bruce, MD Follow up in 6 week(s).   Specialty:  General Surgery Contact information: [place] [address] [phone]            The results of significant diagnostics from this hospitalization (including imaging, microbiology, ancillary and laboratory) are listed below for reference.    Significant Diagnostic Studies: No results found.  Labs: Basic Metabolic Panel:  Recent Labs Lab 12/21/16 1719  NA 137  K 3.7  CL 108  CO2 21*  GLUCOSE 133*  BUN 8  CREATININE 0.74  CALCIUM 8.4*   Liver Function Tests: No results for input(s): AST, ALT, ALKPHOS, BILITOT, PROT, ALBUMIN in the last 168 hours.  CBC:  Recent Labs Lab 12/21/16 1719  WBC 15.3*  HGB 13.1  HCT 37.1  MCV 81.5  PLT 292    CBG: No results for input(s): GLUCAP in the last 168 hours.  Active Problems:   Perianal pain   Time coordinating discharge:  <53min

## 2016-12-27 LAB — AEROBIC/ANAEROBIC CULTURE (SURGICAL/DEEP WOUND)

## 2017-01-01 ENCOUNTER — Telehealth: Payer: Self-pay

## 2017-01-01 ENCOUNTER — Other Ambulatory Visit: Payer: Self-pay | Admitting: Obstetrics

## 2017-01-01 DIAGNOSIS — Z30013 Encounter for initial prescription of injectable contraceptive: Secondary | ICD-10-CM

## 2017-01-01 MED ORDER — MEDROXYPROGESTERONE ACETATE 150 MG/ML IM SUSP: 150.0000 mg | INTRAMUSCULAR | 4 refills | Status: DC

## 2017-01-01 NOTE — Telephone Encounter (Signed)
Returned call and pt stated that refill for depo was not sent after her annual on 12-05-16 and she has appt tomorrow for injection, advised rx would be sent.

## 2017-01-02 ENCOUNTER — Ambulatory Visit (INDEPENDENT_AMBULATORY_CARE_PROVIDER_SITE_OTHER): Payer: Managed Care, Other (non HMO) | Admitting: Pediatrics

## 2017-01-02 DIAGNOSIS — Z3042 Encounter for surveillance of injectable contraceptive: Secondary | ICD-10-CM | POA: Diagnosis not present

## 2017-01-02 MED ORDER — MEDROXYPROGESTERONE ACETATE 150 MG/ML IM SUSP
150.0000 mg | Freq: Once | INTRAMUSCULAR | Status: AC
  Administered 2017-01-02: 150 mg via INTRAMUSCULAR

## 2017-02-09 ENCOUNTER — Ambulatory Visit: Payer: Self-pay | Admitting: General Surgery

## 2017-03-02 ENCOUNTER — Encounter (HOSPITAL_BASED_OUTPATIENT_CLINIC_OR_DEPARTMENT_OTHER): Payer: Self-pay

## 2017-03-05 ENCOUNTER — Encounter (HOSPITAL_BASED_OUTPATIENT_CLINIC_OR_DEPARTMENT_OTHER): Payer: Self-pay | Admitting: *Deleted

## 2017-03-07 ENCOUNTER — Encounter (HOSPITAL_BASED_OUTPATIENT_CLINIC_OR_DEPARTMENT_OTHER): Payer: Self-pay | Admitting: *Deleted

## 2017-03-07 ENCOUNTER — Encounter (HOSPITAL_COMMUNITY)
Admission: RE | Admit: 2017-03-07 | Discharge: 2017-03-07 | Disposition: A | Discharge status: Home / Self Care | Payer: Managed Care, Other (non HMO) | Source: Ambulatory Visit | Attending: General Surgery | Admitting: General Surgery

## 2017-03-07 ENCOUNTER — Other Ambulatory Visit: Payer: Self-pay

## 2017-03-07 DIAGNOSIS — Z79899 Other long term (current) drug therapy: Secondary | ICD-10-CM | POA: Diagnosis not present

## 2017-03-07 DIAGNOSIS — K605 Anorectal fistula: Secondary | ICD-10-CM | POA: Diagnosis not present

## 2017-03-07 DIAGNOSIS — Z888 Allergy status to other drugs, medicaments and biological substances status: Secondary | ICD-10-CM | POA: Diagnosis not present

## 2017-03-07 DIAGNOSIS — Z87891 Personal history of nicotine dependence: Secondary | ICD-10-CM | POA: Diagnosis not present

## 2017-03-07 DIAGNOSIS — F419 Anxiety disorder, unspecified: Secondary | ICD-10-CM | POA: Diagnosis not present

## 2017-03-07 DIAGNOSIS — Z811 Family history of alcohol abuse and dependence: Secondary | ICD-10-CM | POA: Diagnosis not present

## 2017-03-07 DIAGNOSIS — Z820 Family history of epilepsy and other diseases of the nervous system: Secondary | ICD-10-CM | POA: Diagnosis not present

## 2017-03-07 DIAGNOSIS — F329 Major depressive disorder, single episode, unspecified: Secondary | ICD-10-CM | POA: Diagnosis not present

## 2017-03-07 DIAGNOSIS — Z6841 Body Mass Index (BMI) 40.0 and over, adult: Secondary | ICD-10-CM | POA: Diagnosis not present

## 2017-03-07 DIAGNOSIS — Z9102 Food additives allergy status: Secondary | ICD-10-CM | POA: Diagnosis not present

## 2017-03-07 DIAGNOSIS — Z8249 Family history of ischemic heart disease and other diseases of the circulatory system: Secondary | ICD-10-CM | POA: Diagnosis not present

## 2017-03-07 DIAGNOSIS — G43909 Migraine, unspecified, not intractable, without status migrainosus: Secondary | ICD-10-CM | POA: Diagnosis not present

## 2017-03-07 LAB — CBC WITH DIFFERENTIAL/PLATELET
BASOS PCT: 0 %
Basophils Absolute: 0 10*3/uL (ref 0.0–0.1)
Eosinophils Absolute: 0.1 10*3/uL (ref 0.0–0.7)
Eosinophils Relative: 1 %
HEMATOCRIT: 41.7 % (ref 36.0–46.0)
HEMOGLOBIN: 14.4 g/dL (ref 12.0–15.0)
LYMPHS PCT: 41 %
Lymphs Abs: 3.5 10*3/uL (ref 0.7–4.0)
MCH: 28.9 pg (ref 26.0–34.0)
MCHC: 34.5 g/dL (ref 30.0–36.0)
MCV: 83.7 fL (ref 78.0–100.0)
MONO ABS: 0.5 10*3/uL (ref 0.1–1.0)
MONOS PCT: 6 %
NEUTROS ABS: 4.5 10*3/uL (ref 1.7–7.7)
NEUTROS PCT: 52 %
Platelets: 341 10*3/uL (ref 150–400)
RBC: 4.98 MIL/uL (ref 3.87–5.11)
RDW: 12.6 % (ref 11.5–15.5)
WBC: 8.6 10*3/uL (ref 4.0–10.5)

## 2017-03-07 LAB — BASIC METABOLIC PANEL
ANION GAP: 8 (ref 5–15)
BUN: 9 mg/dL (ref 6–20)
CHLORIDE: 106 mmol/L (ref 101–111)
CO2: 25 mmol/L (ref 22–32)
Calcium: 9.4 mg/dL (ref 8.9–10.3)
Creatinine, Ser: 0.67 mg/dL (ref 0.44–1.00)
GFR calc non Af Amer: >60 mL/min (ref >60)
GLUCOSE: 125 mg/dL — AB (ref 65–99)
POTASSIUM: 3.9 mmol/L (ref 3.5–5.1)
Sodium: 139 mmol/L (ref 135–145)

## 2017-03-07 NOTE — Progress Notes (Addendum)
MULTIPLE ATTEMPTS HAVE BEEN MADE TO CONTACT PT FOR PRE-OP INTERVIEW SINCE LAST WEEK.  CALLED OFFICE AND SPOKE W/ JESSICA, OR SCHEDULER, VIA PHONE.  LET HER KNOW HAVE NOT BEEN ABLE TO CONTACT SINCE LAST WEEK.  JESSICA STATED SHE WILL CALL PT AND GET HER TO CALL ME.    RECEIVED CALL FROM PT VIA PHONE W/ SAME NUMBER.  PT APOLOGIZED STATED IS SHE VERY VERY CAREFUL ABOUT ANSWERING W/ PHONE NUMBER SHE DOES NOT KNOW. NPO AFTER MN W/ EXCEPTION CLEAR LIQUIDS UNTIL 0845 (NO CREAM Webb PRODUCTS).  ARRIVE AT 6644.  NEEDS URINE PREG.  GETTING  LAB WORK DONE TODAY (CBCdiff, BMET).  WILL TAKE PROZAC AM DOS  W/ SIPS OF WATER.

## 2017-03-08 ENCOUNTER — Ambulatory Visit (HOSPITAL_BASED_OUTPATIENT_CLINIC_OR_DEPARTMENT_OTHER): Payer: Managed Care, Other (non HMO) | Admitting: Anesthesiology

## 2017-03-08 ENCOUNTER — Encounter (HOSPITAL_BASED_OUTPATIENT_CLINIC_OR_DEPARTMENT_OTHER): Payer: Self-pay | Admitting: Anesthesiology

## 2017-03-08 ENCOUNTER — Ambulatory Visit (HOSPITAL_BASED_OUTPATIENT_CLINIC_OR_DEPARTMENT_OTHER)
Admission: RE | Admit: 2017-03-08 | Discharge: 2017-03-08 | Disposition: A | Discharge status: Home / Self Care | Payer: Managed Care, Other (non HMO) | Source: Ambulatory Visit | Attending: General Surgery | Admitting: General Surgery

## 2017-03-08 ENCOUNTER — Encounter (HOSPITAL_BASED_OUTPATIENT_CLINIC_OR_DEPARTMENT_OTHER)
Admission: RE | Disposition: A | Discharge status: Home / Self Care | Payer: Self-pay | Source: Ambulatory Visit | Attending: General Surgery

## 2017-03-08 DIAGNOSIS — Z9102 Food additives allergy status: Secondary | ICD-10-CM | POA: Insufficient documentation

## 2017-03-08 DIAGNOSIS — Z820 Family history of epilepsy and other diseases of the nervous system: Secondary | ICD-10-CM | POA: Insufficient documentation

## 2017-03-08 DIAGNOSIS — F419 Anxiety disorder, unspecified: Secondary | ICD-10-CM | POA: Insufficient documentation

## 2017-03-08 DIAGNOSIS — Z87891 Personal history of nicotine dependence: Secondary | ICD-10-CM | POA: Insufficient documentation

## 2017-03-08 DIAGNOSIS — Z79899 Other long term (current) drug therapy: Secondary | ICD-10-CM | POA: Insufficient documentation

## 2017-03-08 DIAGNOSIS — Z6841 Body Mass Index (BMI) 40.0 and over, adult: Secondary | ICD-10-CM | POA: Insufficient documentation

## 2017-03-08 DIAGNOSIS — F329 Major depressive disorder, single episode, unspecified: Secondary | ICD-10-CM | POA: Insufficient documentation

## 2017-03-08 DIAGNOSIS — K605 Anorectal fistula: Secondary | ICD-10-CM | POA: Insufficient documentation

## 2017-03-08 DIAGNOSIS — Z888 Allergy status to other drugs, medicaments and biological substances status: Secondary | ICD-10-CM | POA: Insufficient documentation

## 2017-03-08 DIAGNOSIS — Z8249 Family history of ischemic heart disease and other diseases of the circulatory system: Secondary | ICD-10-CM | POA: Insufficient documentation

## 2017-03-08 DIAGNOSIS — G43909 Migraine, unspecified, not intractable, without status migrainosus: Secondary | ICD-10-CM | POA: Insufficient documentation

## 2017-03-08 DIAGNOSIS — Z811 Family history of alcohol abuse and dependence: Secondary | ICD-10-CM | POA: Insufficient documentation

## 2017-03-08 HISTORY — DX: Personal history of other infectious and parasitic diseases: Z86.19

## 2017-03-08 HISTORY — DX: Depression, unspecified: F32.A

## 2017-03-08 HISTORY — DX: Anxiety disorder, unspecified: F41.9

## 2017-03-08 HISTORY — DX: Major depressive disorder, single episode, unspecified: F32.9

## 2017-03-08 HISTORY — DX: Anal fistula: K60.3

## 2017-03-08 HISTORY — PX: LIGATION OF INTERNAL FISTULA TRACT: SHX6551

## 2017-03-08 LAB — POCT PREGNANCY, URINE: Preg Test, Ur: NEGATIVE

## 2017-03-08 SURGERY — EXAM UNDER ANESTHESIA
Anesthesia: Monitor Anesthesia Care

## 2017-03-08 MED ORDER — BUPIVACAINE LIPOSOME 1.3 % IJ SUSP
INTRAMUSCULAR | Status: DC | PRN
  Administered 2017-03-08: 20 mL

## 2017-03-08 MED ORDER — HYDROGEN PEROXIDE 3 % EX SOLN
CUTANEOUS | Status: DC | PRN
  Administered 2017-03-08: 1

## 2017-03-08 MED ORDER — ONDANSETRON HCL 4 MG/2ML IJ SOLN
INTRAMUSCULAR | Status: AC
  Filled 2017-03-08: qty 2

## 2017-03-08 MED ORDER — KETAMINE HCL-SODIUM CHLORIDE 100-0.9 MG/10ML-% IV SOSY
PREFILLED_SYRINGE | INTRAVENOUS | Status: AC
  Filled 2017-03-08: qty 10

## 2017-03-08 MED ORDER — HYDROCODONE-ACETAMINOPHEN 5-325 MG PO TABS: 1.0000 | ORAL_TABLET | Freq: Four times a day (QID) | ORAL | 0 refills | Status: DC | PRN

## 2017-03-08 MED ORDER — PROPOFOL 500 MG/50ML IV EMUL
INTRAVENOUS | Status: DC | PRN
  Administered 2017-03-08: 200 ug/kg/min via INTRAVENOUS

## 2017-03-08 MED ORDER — CEFAZOLIN SODIUM-DEXTROSE 2-4 GM/100ML-% IV SOLN
2.0000 g | INTRAVENOUS | Status: AC
  Administered 2017-03-08: 2 g via INTRAVENOUS
  Filled 2017-03-08: qty 100

## 2017-03-08 MED ORDER — SODIUM CHLORIDE 0.9 % IV SOLN
INTRAVENOUS | Status: DC | PRN
  Administered 2017-03-08: 20 ug via INTRAVENOUS

## 2017-03-08 MED ORDER — BUPIVACAINE HCL 0.5 % IJ SOLN
INTRAMUSCULAR | Status: DC | PRN
  Administered 2017-03-08: 30 mL

## 2017-03-08 MED ORDER — FENTANYL CITRATE (PF) 100 MCG/2ML IJ SOLN
INTRAMUSCULAR | Status: DC | PRN
  Administered 2017-03-08 (×2): 25 ug via INTRAVENOUS

## 2017-03-08 MED ORDER — IBUPROFEN 800 MG PO TABS: 800.0000 mg | ORAL_TABLET | Freq: Three times a day (TID) | ORAL | 0 refills | Status: DC | PRN

## 2017-03-08 MED ORDER — MIDAZOLAM HCL 2 MG/2ML IJ SOLN
INTRAMUSCULAR | Status: AC
  Filled 2017-03-08: qty 2

## 2017-03-08 MED ORDER — DEXAMETHASONE SODIUM PHOSPHATE 10 MG/ML IJ SOLN
INTRAMUSCULAR | Status: DC | PRN
  Administered 2017-03-08: 10 mg via INTRAVENOUS

## 2017-03-08 MED ORDER — METOCLOPRAMIDE HCL 5 MG/ML IJ SOLN
10.0000 mg | Freq: Once | INTRAMUSCULAR | Status: DC | PRN
  Filled 2017-03-08: qty 2

## 2017-03-08 MED ORDER — GABAPENTIN 300 MG PO CAPS
ORAL_CAPSULE | ORAL | Status: AC
  Filled 2017-03-08: qty 1

## 2017-03-08 MED ORDER — PROPOFOL 10 MG/ML IV BOLUS
INTRAVENOUS | Status: AC
  Filled 2017-03-08: qty 40

## 2017-03-08 MED ORDER — CELECOXIB 200 MG PO CAPS
ORAL_CAPSULE | ORAL | Status: AC
  Filled 2017-03-08: qty 1

## 2017-03-08 MED ORDER — SUCCINYLCHOLINE CHLORIDE 200 MG/10ML IV SOSY
PREFILLED_SYRINGE | INTRAVENOUS | Status: AC
  Filled 2017-03-08: qty 10

## 2017-03-08 MED ORDER — HYDROCODONE-ACETAMINOPHEN 5-325 MG PO TABS
ORAL_TABLET | ORAL | Status: AC
Start: 2017-03-08 — End: ?
  Filled 2017-03-08: qty 1

## 2017-03-08 MED ORDER — ACETAMINOPHEN 500 MG PO TABS
ORAL_TABLET | ORAL | Status: AC
  Filled 2017-03-08: qty 2

## 2017-03-08 MED ORDER — CELECOXIB 200 MG PO CAPS
200.0000 mg | ORAL_CAPSULE | ORAL | Status: AC
Start: 2017-03-09 — End: 2017-03-08
  Administered 2017-03-08: 200 mg via ORAL
  Filled 2017-03-08: qty 1

## 2017-03-08 MED ORDER — ACETAMINOPHEN 500 MG PO TABS
1000.0000 mg | ORAL_TABLET | ORAL | Status: AC
  Administered 2017-03-08: 1000 mg via ORAL
  Filled 2017-03-08: qty 2

## 2017-03-08 MED ORDER — FENTANYL CITRATE (PF) 100 MCG/2ML IJ SOLN
INTRAMUSCULAR | Status: AC
  Filled 2017-03-08: qty 2

## 2017-03-08 MED ORDER — CEFAZOLIN SODIUM-DEXTROSE 2-4 GM/100ML-% IV SOLN
INTRAVENOUS | Status: AC
Start: 2017-03-08 — End: ?
  Filled 2017-03-08: qty 100

## 2017-03-08 MED ORDER — DEXAMETHASONE SODIUM PHOSPHATE 10 MG/ML IJ SOLN
INTRAMUSCULAR | Status: AC
  Filled 2017-03-08: qty 1

## 2017-03-08 MED ORDER — PROPOFOL 500 MG/50ML IV EMUL
INTRAVENOUS | Status: AC
  Filled 2017-03-08: qty 50

## 2017-03-08 MED ORDER — ROCURONIUM BROMIDE 50 MG/5ML IV SOSY
PREFILLED_SYRINGE | INTRAVENOUS | Status: AC
  Filled 2017-03-08: qty 5

## 2017-03-08 MED ORDER — MEPERIDINE HCL 25 MG/ML IJ SOLN
6.2500 mg | INTRAMUSCULAR | Status: DC | PRN
  Filled 2017-03-08: qty 1

## 2017-03-08 MED ORDER — LACTATED RINGERS IV SOLN
INTRAVENOUS | Status: DC
  Administered 2017-03-08: 19:00:00 via INTRAVENOUS
  Filled 2017-03-08: qty 1000

## 2017-03-08 MED ORDER — METHYLENE BLUE 0.5 % INJ SOLN
INTRAVENOUS | Status: DC | PRN
  Administered 2017-03-08: 9 mL

## 2017-03-08 MED ORDER — MIDAZOLAM HCL 2 MG/2ML IJ SOLN
INTRAMUSCULAR | Status: DC | PRN
  Administered 2017-03-08: 2 mg via INTRAVENOUS

## 2017-03-08 MED ORDER — ONDANSETRON HCL 4 MG/2ML IJ SOLN
INTRAMUSCULAR | Status: DC | PRN
  Administered 2017-03-08: 4 mg via INTRAVENOUS

## 2017-03-08 MED ORDER — CHLORHEXIDINE GLUCONATE CLOTH 2 % EX PADS
6.0000 | MEDICATED_PAD | Freq: Once | CUTANEOUS | Status: DC
  Filled 2017-03-08: qty 6

## 2017-03-08 MED ORDER — HYDROCODONE-ACETAMINOPHEN 5-325 MG PO TABS
1.0000 | ORAL_TABLET | Freq: Four times a day (QID) | ORAL | Status: DC | PRN
  Administered 2017-03-08: 1 via ORAL
  Filled 2017-03-08: qty 2

## 2017-03-08 MED ORDER — LIDOCAINE 2% (20 MG/ML) 5 ML SYRINGE
INTRAMUSCULAR | Status: AC
  Filled 2017-03-08: qty 10

## 2017-03-08 MED ORDER — GABAPENTIN 300 MG PO CAPS
300.0000 mg | ORAL_CAPSULE | ORAL | Status: DC
  Filled 2017-03-08: qty 1

## 2017-03-08 MED ORDER — DOCUSATE SODIUM 100 MG PO CAPS: 100.0000 mg | ORAL_CAPSULE | Freq: Two times a day (BID) | ORAL | 0 refills | Status: AC

## 2017-03-08 MED ORDER — FENTANYL CITRATE (PF) 100 MCG/2ML IJ SOLN
25.0000 ug | INTRAMUSCULAR | Status: DC | PRN
Start: 2017-03-08 — End: 2017-03-08
  Filled 2017-03-08: qty 1

## 2017-03-08 MED ORDER — LACTATED RINGERS IV SOLN
INTRAVENOUS | Status: DC
  Administered 2017-03-08 (×2): via INTRAVENOUS
  Filled 2017-03-08: qty 1000

## 2017-03-08 MED ORDER — LIDOCAINE 2% (20 MG/ML) 5 ML SYRINGE
INTRAMUSCULAR | Status: DC | PRN
  Administered 2017-03-08: 50 mg via INTRAVENOUS

## 2017-03-08 SURGICAL SUPPLY — 44 items
BLADE HEX COATED 2.75 (ELECTRODE) ×2 IMPLANT
BLADE SURG 15 STRL LF DISP TIS (BLADE) ×1 IMPLANT
BLADE SURG 15 STRL SS (BLADE) ×1
BRIEF STRETCH FOR OB PAD LRG (UNDERPADS AND DIAPERS) ×2 IMPLANT
COVER BACK TABLE 60X90IN (DRAPES) ×2 IMPLANT
COVER MAYO STAND STRL (DRAPES) ×2 IMPLANT
DRAPE LAPAROTOMY 100X72 PEDS (DRAPES) ×2 IMPLANT
DRAPE UTILITY XL STRL (DRAPES) ×2 IMPLANT
ELECT BLADE 6.5 .24CM SHAFT (ELECTRODE) IMPLANT
ELECT BLADE TIP CTD 4 INCH (ELECTRODE) ×2 IMPLANT
ELECT REM PT RETURN 9FT ADLT (ELECTROSURGICAL) ×2
ELECTRODE REM PT RTRN 9FT ADLT (ELECTROSURGICAL) ×1 IMPLANT
GAUZE SPONGE 4X4 16PLY XRAY LF (GAUZE/BANDAGES/DRESSINGS) ×2 IMPLANT
GAUZE SPONGE 4X4 8PLY STR LF (GAUZE/BANDAGES/DRESSINGS) ×2 IMPLANT
GLOVE BIOGEL PI IND STRL 7.0 (GLOVE) ×1 IMPLANT
GLOVE BIOGEL PI INDICATOR 7.0 (GLOVE) ×1
GLOVE SURG SS PI 7.0 STRL IVOR (GLOVE) ×2 IMPLANT
GOWN STRL REUS W/TWL LRG LVL3 (GOWN DISPOSABLE) ×4 IMPLANT
KIT RM TURNOVER CYSTO AR (KITS) ×2 IMPLANT
NEEDLE HYPO 22GX1.5 SAFETY (NEEDLE) ×2 IMPLANT
NS IRRIG 500ML POUR BTL (IV SOLUTION) ×2 IMPLANT
PACK BASIN DAY SURGERY FS (CUSTOM PROCEDURE TRAY) ×2 IMPLANT
PAD ABD 8X10 STRL (GAUZE/BANDAGES/DRESSINGS) ×2 IMPLANT
PENCIL BUTTON HOLSTER BLD 10FT (ELECTRODE) ×2 IMPLANT
SPONGE GAUZE 4X4 12PLY STER LF (GAUZE/BANDAGES/DRESSINGS) IMPLANT
SPONGE HEMORRHOID 8X3CM (HEMOSTASIS) IMPLANT
SPONGE SURGIFOAM ABS GEL 12-7 (HEMOSTASIS) IMPLANT
SUT CHROMIC 2 0 SH (SUTURE) IMPLANT
SUT CHROMIC 3 0 SH 27 (SUTURE) ×2 IMPLANT
SUT ETHIBOND 0 (SUTURE) IMPLANT
SUT PROLENE 2 0 BLUE (SUTURE) IMPLANT
SUT SILK 3 0 SH 30 (SUTURE) ×8 IMPLANT
SUT VIC AB 2-0 SH 27 (SUTURE) ×3
SUT VIC AB 2-0 SH 27XBRD (SUTURE) ×3 IMPLANT
SUT VIC AB 3-0 SH 27 (SUTURE)
SUT VIC AB 3-0 SH 27X BRD (SUTURE) IMPLANT
SUT VIC AB 4-0 P-3 18XBRD (SUTURE) IMPLANT
SUT VIC AB 4-0 P3 18 (SUTURE)
SUT VIC AB 4-0 SH 18 (SUTURE) IMPLANT
SYR BULB IRRIGATION 50ML (SYRINGE) ×2 IMPLANT
SYR CONTROL 10ML LL (SYRINGE) ×2 IMPLANT
TRAY DSU PREP LF (CUSTOM PROCEDURE TRAY) ×2 IMPLANT
TUBE CONNECTING 12X1/4 (SUCTIONS) ×2 IMPLANT
YANKAUER SUCT BULB TIP NO VENT (SUCTIONS) ×2 IMPLANT

## 2017-03-08 NOTE — H&P (Signed)
[name] [name] is an 31 y.o. female.   Chief Complaint: fistula in anus HPI: 31 yo female presented with recurrent abscess in her anus and was diagnosed with a fistula in ano. She presents today for treatment of the fistula  Past Medical History:  Diagnosis Date  . Anxiety   . Depression   . Fistula-in-ano   . 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 Migraine     Past Surgical History:  Procedure Laterality Date  . INCISION AND DRAINAGE PERIRECTAL ABSCESS Left 12/21/2016   Procedure: PLACEMENT OF SETON INTO FISTULA  AND INCISION AND DRAINAGE OF PERIRECTAL ABCESS;  Surgeon: Kieth Brightly [name], MD;  Location: [facility];  Service: General;  Laterality: Left;  . IRRIGATION AND DEBRIDEMENT BUTTOCKS Left 12/28/2015   Procedure: IRRIGATION AND DEBRIDEMENT BUTTOCKS ABSCESS CHRONIC;  Surgeon: Erroll Luna, MD;  Location: [facility];  Service: General;  Laterality: Left;    Family History  Problem Relation Age of Onset  . Alcoholism Father   . Epilepsy Father   . Heart disease Father    Social History:  reports that she quit smoking about 9 years ago. Her smoking use included cigarettes. she has never used smokeless tobacco. She reports that she drinks alcohol. She reports that she does not use drugs.  Allergies:  Allergies  Allergen Reactions  . Cinnamon Other (See Comments)    Blisters on mouth and lips, coughing.    . Gabapentin     hallunciations    Medications Prior to Admission  Medication Sig Dispense Refill  . FLUoxetine (PROZAC) 20 MG capsule Take 1 capsule (20 mg total) by mouth daily. Increase to 2 pills after 1-2 weeks (Patient taking differently: Take 40 mg by mouth every morning. Increase to 2 pills after 1-2 weeks) 60 capsule 3  . ibuprofen (ADVIL,MOTRIN) 800 MG tablet Take 1 tablet (800 mg total) by mouth every 8 (eight) hours as needed. 30 tablet 0  . naproxen sodium (ALEVE) 220 MG tablet Take 220 mg by mouth 2 (two) times daily as needed.    .  medroxyPROGESTERone (DEPO-PROVERA) 150 MG/ML injection Inject 1 mL (150 mg total) into the muscle every 3 (three) months. (Patient taking differently: Inject 150 mg into the muscle every 3 (three) months. Per pt last injection 10/ 2018 (next injection 03-28-2017)) 1 mL 4  . valACYclovir (VALTREX) 1000 MG tablet Take 1 tablet (1,000 mg total) by mouth daily. (Patient taking differently: Take 1,000 mg by mouth as needed. As needed for outbreak.) 30 tablet 1    Results for orders placed or performed during the hospital encounter of 03/08/17 (from the past 48 hour(s))  Basic metabolic panel     Status: Abnormal   Collection Time: 03/07/17 11:30 AM  Result Value Ref Range   Sodium 139 135 - 145 mmol/L   Potassium 3.9 3.5 - 5.1 mmol/L   Chloride 106 101 - 111 mmol/L   CO2 25 22 - 32 mmol/L   Glucose, Bld 125 (H) 65 - 99 mg/dL   BUN 9 6 - 20 mg/dL   Creatinine, Ser 0.67 0.44 - 1.00 mg/dL   Calcium 9.4 8.9 - 10.3 mg/dL   GFR calc non Af Amer >60 >60 mL/min   GFR calc Af Amer >60 >60 mL/min    Comment: (NOTE) The eGFR has been calculated using the CKD EPI equation. This calculation has not been validated in all clinical situations. eGFR's persistently <60 mL/min signify possible Chronic Kidney Disease.  Anion gap 8 5 - 15  CBC WITH DIFFERENTIAL     Status: None   Collection Time: 03/07/17 11:30 AM  Result Value Ref Range   WBC 8.6 4.0 - 10.5 K/uL   RBC 4.98 3.87 - 5.11 MIL/uL   Hemoglobin 14.4 12.0 - 15.0 g/dL   HCT 41.7 36.0 - 46.0 %   MCV 83.7 78.0 - 100.0 fL   MCH 28.9 26.0 - 34.0 pg   MCHC 34.5 30.0 - 36.0 g/dL   RDW 12.6 11.5 - 15.5 %   Platelets 341 150 - 400 K/uL   Neutrophils Relative % 52 %   Neutro Abs 4.5 1.7 - 7.7 K/uL   Lymphocytes Relative 41 %   Lymphs Abs 3.5 0.7 - 4.0 K/uL   Monocytes Relative 6 %   Monocytes Absolute 0.5 0.1 - 1.0 K/uL   Eosinophils Relative 1 %   Eosinophils Absolute 0.1 0.0 - 0.7 K/uL   Basophils Relative 0 %   Basophils Absolute 0.0 0.0 -  0.1 K/uL  Pregnancy, urine POC     Status: None   Collection Time: 03/08/17  1:30 PM  Result Value Ref Range   Preg Test, Ur NEGATIVE NEGATIVE    Comment:        THE SENSITIVITY OF THIS METHODOLOGY IS >24 mIU/mL    No results found.  Review of Systems  Constitutional: Negative for chills and fever.  HENT: Negative for hearing loss.   Eyes: Negative for blurred vision and double vision.  Respiratory: Negative for cough and hemoptysis.   Cardiovascular: Negative for chest pain and palpitations.  Gastrointestinal: Negative for abdominal pain, nausea and vomiting.  Genitourinary: Negative for dysuria and urgency.  Musculoskeletal: Negative for myalgias and neck pain.  Skin: Negative for itching and rash.  Neurological: Negative for dizziness, tingling and headaches.  Endo/Heme/Allergies: Does not bruise/bleed easily.  Psychiatric/Behavioral: Negative for depression and suicidal ideas.    Blood pressure 130/68, pulse 87, temperature 98.1 F (36.7 C), resp. rate 16, height 5' 2"  (1.575 m), weight 100 kg (220 lb 8 oz), SpO2 98 %. Physical Exam  Vitals reviewed. Constitutional: She is oriented to person, place, and time. She appears well-developed and well-nourished.  HENT:  Head: Normocephalic and atraumatic.  Eyes: Conjunctivae and EOM are normal. Pupils are equal, round, and reactive to light.  Neck: Normal range of motion. Neck supple.  Cardiovascular: Normal rate and regular rhythm.  Respiratory: Effort normal and breath sounds normal.  GI: Soft. Bowel sounds are normal. She exhibits no distension. There is no tenderness.  Genitourinary:  Genitourinary Comments: Seton in place,   Musculoskeletal: Normal range of motion.  Neurological: She is alert and oriented to person, place, and time.  Skin: Skin is warm and dry.  Psychiatric: She has a normal mood and affect. Her behavior is normal.     Assessment/Plan 31 yo female with fistula in ano -EUA with possible LIFt  procedure  Mickeal Skinner, MD 03/08/2017, 4:01 PM

## 2017-03-08 NOTE — Anesthesia Procedure Notes (Signed)
Procedure Name: MAC Date/Time: 03/08/2017 4:00 PM Performed by: Wanita Chamberlain, CRNA Pre-anesthesia Checklist: Patient identified, Timeout performed, Emergency Drugs available, Suction available and Patient being monitored Patient Re-evaluated:Patient Re-evaluated prior to induction Oxygen Delivery Method: Nasal cannula Induction Type: IV induction Placement Confirmation: breath sounds checked- equal and bilateral,  CO2 detector and positive ETCO2

## 2017-03-08 NOTE — Transfer of Care (Signed)
Immediate Anesthesia Transfer of Care Note  Patient: [name] [name]  Procedure(s) Performed: ANAL EXAM UNDER ANESTHESIA (N/A ) LIGATION OF INTERNAL INTERSPHINCTERIC FISTULA TRACT (N/A )  Patient Location: PACU  Anesthesia Type:MAC  Level of Consciousness: sedated and patient cooperative  Airway & Oxygen Therapy: Patient Spontanous Breathing and Patient connected to nasal cannula oxygen  Post-op Assessment: Report given to RN and Post -op Vital signs reviewed and stable  Post vital signs: Reviewed and stable  Last Vitals:  Vitals:   03/08/17 1247 03/08/17 1730  BP: 130/68 (!) (P) 147/107  Pulse: 87   Resp: 16   Temp: 36.7 C (P) 37 C  SpO2: 98%     Last Pain:  Vitals:   03/08/17 1312  PainSc: 3       Patients Stated Pain Goal: 5 (11/55/20 8022)  Complications: No apparent anesthesia complications

## 2017-03-08 NOTE — Anesthesia Postprocedure Evaluation (Signed)
Anesthesia Post Note  Patient: GLENDENE WYER  Procedure(s) Performed: ANAL EXAM UNDER ANESTHESIA (N/A ) LIGATION OF INTERNAL INTERSPHINCTERIC FISTULA TRACT (N/A )     Patient location during evaluation: PACU Anesthesia Type: MAC Level of consciousness: awake and alert Pain management: pain level controlled Vital Signs Assessment: post-procedure vital signs reviewed and stable Respiratory status: spontaneous breathing and respiratory function stable Cardiovascular status: stable Postop Assessment: no apparent nausea or vomiting Anesthetic complications: no    Last Vitals:  Vitals:   03/08/17 1745 03/08/17 1800  BP: (!) 145/60 (!) 162/111  Pulse: (!) 108 (!) 107  Resp: (!) 23 (!) 22  Temp:    SpO2: 100% 100%    Last Pain:  Vitals:   03/08/17 1800  PainSc: 0-No pain                 Chimene Salo DANIEL

## 2017-03-08 NOTE — Discharge Instructions (Signed)
°  Post Anesthesia Home Care Instructions  Activity: Get plenty of rest for the remainder of the day. A responsible individual must stay with you for 24 hours following the procedure.  For the next 24 hours, DO NOT: -Drive a car -Paediatric nurse -Drink alcoholic beverages -Take any medication unless instructed by your physician -Make any legal decisions or sign important papers.  Meals: Start with liquid foods such as gelatin or soup. Progress to regular foods as tolerated. Avoid greasy, spicy, heavy foods. If nausea and/or vomiting occur, drink only clear liquids until the nausea and/or vomiting subsides. Call your physician if vomiting continues.  Special Instructions/Symptoms: Your throat may feel dry or sore from the anesthesia or the breathing tube placed in your throat during surgery. If this causes discomfort, gargle with warm salt water. The discomfort should disappear within 24 hours.  If you had a scopolamine patch placed behind your ear for the management of post- operative nausea and/or vomiting:  1. The medication in the patch is effective for 72 hours, after which it should be removed.  Wrap patch in a tissue and discard in the trash. Wash hands thoroughly with soap and water. 2. You may remove the patch earlier than 72 hours if you experience unpleasant side effects which may include dry mouth, dizziness or visual disturbances. 3. Avoid touching the patch. Wash your hands with soap and water after contact with the patch.  No advil, aleve, motrin, ibuprofen until 7 pm today   Information for Discharge Teaching: EXPAREL (bupivacaine liposome injectable suspension)   Your surgeon gave you EXPAREL(bupivacaine) in your surgical incision to help control your pain after surgery.   EXPAREL is a local anesthetic that provides pain relief by numbing the tissue around the surgical site.  EXPAREL is designed to release pain medication over time and can control pain for up to 72  hours.  Depending on how you respond to EXPAREL, you may require less pain medication during your recovery.  Possible side effects:  Temporary loss of sensation or ability to move in the area where bupivacaine was injected.  Nausea, vomiting, constipation  Rarely, numbness and tingling in your mouth or lips, lightheadedness, or anxiety may occur.  Call your doctor right away if you think you may be experiencing any of these sensations, or if you have other questions regarding possible side effects.  Follow all other discharge instructions given to you by your surgeon or nurse. Eat a healthy diet and drink plenty of water or other fluids.  If you return to the hospital for any reason within 96 hours following the administration of EXPAREL, please inform your health care providers.

## 2017-03-08 NOTE — Op Note (Signed)
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  Asst: Neysa Bonito  Anesthesia: MAC  Indications for procedure: [name] [name] is a 31 y.o. year old female with symptoms of recurrent abscess and was found to have a fistula in ano. She had a seton placed and now presented for definitive repair.  Description of procedure: The patient was brought into the operative suite. Anesthesia was administered with Monitored Local Anesthesia with Sedation. WHO checklist was applied. The patient was then placed in prone/jackknirfe position. The area was prepped and draped in the usual sterile fashion.  The fistula was identified and appeared to be intersphincteric. The perianal area was injected with an Exparel:Marcaine mix. A probe was passed through the tract and the seton was removed. Methylene blue was injected into the tract. Next, a circumferential incision was made at the anoderm border and cautery was used to dissect through the subcutaneous tissue. Next, blunt dissection was used to encircle the fistula tract in 360 degrees. A 3-0 silk tie was used to ligate the proximal and distal portions of the fistula. The fistula was divided. An additional 3-0 silk stick tie was used to reinforce the ligation. Peroxide was injected into the internal opening and appeared ligation was water tight. Peroxide was injected into the external opening and had a leak. An additional 2-0 vicryl suture was used to reinforce and imbricate the tissue around the fistula. On recheck with peroxide no leak was detected. The internal opening mucosa was excised and a 2-0 vicryl was used to close the mucosa radially in running fashion. The external fistula opening was excised down 3cm and the skin was left open. Next, the circumferential incision was closed with a 3-0 chromic in interrupted fashion.  The patient awoke from anesthesia and was brought to pacu in stable condition  Findings: intersphincteric fistula tract with anterior midline opening  Specimen: none  Implant: none   Blood loss: 50m  Local anesthesia: 50 ml Exparel:Marcaine Mix  Complications: none  LGurney Maxin M.D. General, Bariatric, & Minimally Invasive Surgery [facility]

## 2017-03-08 NOTE — Anesthesia Preprocedure Evaluation (Addendum)
Anesthesia Evaluation  Patient identified by MRN, date of birth, ID band Patient awake    Reviewed: Allergy & Precautions, NPO status , Patient's Chart, lab work & pertinent test results  Airway Mallampati: II  TM Distance: >3 FB Neck ROM: Full    Dental no notable dental hx.    Pulmonary neg pulmonary ROS, former smoker,    Pulmonary exam normal breath sounds clear to auscultation       Cardiovascular negative cardio ROS Normal cardiovascular exam Rhythm:Regular Rate:Normal     Neuro/Psych negative neurological ROS  negative psych ROS   GI/Hepatic negative GI ROS, Neg liver ROS, neg GERD  ,  Endo/Other  Morbid obesity  Renal/GU negative Renal ROS  negative genitourinary   Musculoskeletal negative musculoskeletal ROS (+)   Abdominal   Peds negative pediatric ROS (+)  Hematology negative hematology ROS (+)   Anesthesia Other Findings   Reproductive/Obstetrics negative OB ROS                             Anesthesia Physical Anesthesia Plan  ASA: III  Anesthesia Plan: MAC   Post-op Pain Management:    Induction:   PONV Risk Score and Plan: 3 and Ondansetron, Dexamethasone, Midazolam and Treatment may vary due to age or medical condition  Airway Management Planned: Natural Airway, Simple Face Mask and Nasal Cannula  Additional Equipment:   Intra-op Plan:   Post-operative Plan:   Informed Consent: I have reviewed the patients History and Physical, chart, labs and discussed the procedure including the risks, benefits and alternatives for the proposed anesthesia with the patient or authorized representative who has indicated his/her understanding and acceptance.   Dental advisory given  Plan Discussed with: CRNA and Anesthesiologist  Anesthesia Plan Comments: (prone)       Anesthesia Quick Evaluation

## 2017-03-09 ENCOUNTER — Encounter (HOSPITAL_BASED_OUTPATIENT_CLINIC_OR_DEPARTMENT_OTHER): Payer: Self-pay | Admitting: General Surgery

## 2017-03-21 ENCOUNTER — Ambulatory Visit (INDEPENDENT_AMBULATORY_CARE_PROVIDER_SITE_OTHER): Payer: Managed Care, Other (non HMO)

## 2017-03-21 DIAGNOSIS — Z3042 Encounter for surveillance of injectable contraceptive: Secondary | ICD-10-CM

## 2017-03-21 MED ORDER — MEDROXYPROGESTERONE ACETATE 150 MG/ML IM SUSP
150.0000 mg | Freq: Once | INTRAMUSCULAR | Status: AC
  Administered 2017-03-21: 150 mg via INTRAMUSCULAR

## 2017-03-21 NOTE — Progress Notes (Signed)
Nurse visit for pt supply Depo. Pt is on time for inj. given L Del w/o difficulty. Next Depo Mar 6-20

## 2017-03-22 NOTE — Progress Notes (Signed)
Agree with nursing staff's documentation of this patient's clinic encounter.  Mora Bellman, MD 03/22/2017 8:51 AM

## 2017-06-06 ENCOUNTER — Other Ambulatory Visit: Payer: Self-pay | Admitting: General Surgery

## 2017-06-06 DIAGNOSIS — G8918 Other acute postprocedural pain: Secondary | ICD-10-CM

## 2017-06-11 ENCOUNTER — Ambulatory Visit: Payer: Self-pay

## 2017-06-11 DIAGNOSIS — Z3042 Encounter for surveillance of injectable contraceptive: Secondary | ICD-10-CM

## 2017-06-11 MED ORDER — MEDROXYPROGESTERONE ACETATE 150 MG/ML IM SUSP
150.0000 mg | Freq: Once | INTRAMUSCULAR | Status: AC
  Administered 2017-06-11: 150 mg via INTRAMUSCULAR

## 2017-06-11 NOTE — Progress Notes (Signed)
Nurse visit for pt supplied Depo given L del w/o difficulty. Pt was on time for inj.

## 2017-06-13 ENCOUNTER — Ambulatory Visit
Admission: RE | Admit: 2017-06-13 | Discharge: 2017-06-13 | Disposition: A | Discharge status: Home / Self Care | Payer: Managed Care, Other (non HMO) | Source: Ambulatory Visit | Attending: General Surgery | Admitting: General Surgery

## 2017-06-13 DIAGNOSIS — G8918 Other acute postprocedural pain: Secondary | ICD-10-CM

## 2017-06-13 MED ORDER — GADOBENATE DIMEGLUMINE 529 MG/ML IV SOLN: 20.0000 mL | Freq: Once | INTRAVENOUS | Status: DC | PRN

## 2017-06-26 ENCOUNTER — Ambulatory Visit: Payer: Self-pay | Admitting: General Surgery

## 2017-06-27 ENCOUNTER — Other Ambulatory Visit: Payer: Self-pay

## 2017-06-27 ENCOUNTER — Encounter (HOSPITAL_BASED_OUTPATIENT_CLINIC_OR_DEPARTMENT_OTHER): Payer: Self-pay

## 2017-06-27 NOTE — Progress Notes (Signed)
Spoke with:  Caryl Pina NPO:  After Midnight, no gum, candy, or mints   Arrival time:  0830AM Labs: Urine Preg AM medications: Fluoxetine Pre op orders: Yes Ride home:  Remo Lipps (boyfriend) [phone]

## 2017-06-27 NOTE — Anesthesia Preprocedure Evaluation (Addendum)
Anesthesia Evaluation  Patient identified by MRN, date of birth, ID band Patient awake    Reviewed: Allergy & Precautions, NPO status , Patient's Chart, lab work & pertinent test results  Airway Mallampati: II  TM Distance: >3 FB Neck ROM: Full    Dental no notable dental hx.    Pulmonary former smoker,    Pulmonary exam normal breath sounds clear to auscultation       Cardiovascular negative cardio ROS Normal cardiovascular exam Rhythm:Regular Rate:Normal     Neuro/Psych  Headaches, PSYCHIATRIC DISORDERS Anxiety Depression    GI/Hepatic negative GI ROS, Neg liver ROS, neg GERD  ,  Endo/Other  Morbid obesity  Renal/GU negative Renal ROS  negative genitourinary   Musculoskeletal negative musculoskeletal ROS (+)   Abdominal   Peds negative pediatric ROS (+)  Hematology negative hematology ROS (+)   Anesthesia Other Findings   Reproductive/Obstetrics negative OB ROS                             Anesthesia Physical  Anesthesia Plan  ASA: III  Anesthesia Plan: MAC   Post-op Pain Management:    Induction: Intravenous  PONV Risk Score and Plan: 3 and Ondansetron, Dexamethasone, Midazolam, Treatment may vary due to age or medical condition and Scopolamine patch - Pre-op  Airway Management Planned: Nasal Cannula, Natural Airway and Mask  Additional Equipment:   Intra-op Plan:   Post-operative Plan:   Informed Consent: I have reviewed the patients History and Physical, chart, labs and discussed the procedure including the risks, benefits and alternatives for the proposed anesthesia with the patient or authorized representative who has indicated his/her understanding and acceptance.   Dental advisory given  Plan Discussed with: CRNA, Anesthesiologist and Surgeon  Anesthesia Plan Comments: (prone)     Anesthesia Quick Evaluation

## 2017-06-28 ENCOUNTER — Encounter (HOSPITAL_BASED_OUTPATIENT_CLINIC_OR_DEPARTMENT_OTHER)
Admission: RE | Disposition: A | Discharge status: Home / Self Care | Payer: Self-pay | Source: Ambulatory Visit | Attending: General Surgery

## 2017-06-28 ENCOUNTER — Ambulatory Visit (HOSPITAL_BASED_OUTPATIENT_CLINIC_OR_DEPARTMENT_OTHER): Payer: Self-pay | Admitting: Anesthesiology

## 2017-06-28 ENCOUNTER — Ambulatory Visit (HOSPITAL_BASED_OUTPATIENT_CLINIC_OR_DEPARTMENT_OTHER)
Admission: RE | Admit: 2017-06-28 | Discharge: 2017-06-28 | Disposition: A | Discharge status: Home / Self Care | Payer: Self-pay | Source: Ambulatory Visit | Attending: General Surgery | Admitting: General Surgery

## 2017-06-28 ENCOUNTER — Encounter (HOSPITAL_BASED_OUTPATIENT_CLINIC_OR_DEPARTMENT_OTHER): Payer: Self-pay

## 2017-06-28 DIAGNOSIS — K603 Anal fistula: Secondary | ICD-10-CM | POA: Insufficient documentation

## 2017-06-28 DIAGNOSIS — Z87891 Personal history of nicotine dependence: Secondary | ICD-10-CM | POA: Insufficient documentation

## 2017-06-28 DIAGNOSIS — Z888 Allergy status to other drugs, medicaments and biological substances status: Secondary | ICD-10-CM | POA: Insufficient documentation

## 2017-06-28 DIAGNOSIS — Z79899 Other long term (current) drug therapy: Secondary | ICD-10-CM | POA: Insufficient documentation

## 2017-06-28 DIAGNOSIS — F329 Major depressive disorder, single episode, unspecified: Secondary | ICD-10-CM | POA: Insufficient documentation

## 2017-06-28 DIAGNOSIS — F419 Anxiety disorder, unspecified: Secondary | ICD-10-CM | POA: Insufficient documentation

## 2017-06-28 DIAGNOSIS — Z6841 Body Mass Index (BMI) 40.0 and over, adult: Secondary | ICD-10-CM | POA: Insufficient documentation

## 2017-06-28 HISTORY — DX: Paresthesia of skin: R20.0

## 2017-06-28 HISTORY — PX: FISTULOTOMY: SHX6413

## 2017-06-28 HISTORY — DX: Anesthesia of skin: R20.2

## 2017-06-28 LAB — POCT PREGNANCY, URINE: Preg Test, Ur: NEGATIVE

## 2017-06-28 LAB — POCT HEMOGLOBIN-HEMACUE: Hemoglobin: 15.3 g/dL — ABNORMAL HIGH (ref 12.0–15.0)

## 2017-06-28 SURGERY — FISTULOTOMY
Anesthesia: General | Site: Rectum

## 2017-06-28 MED ORDER — MIDAZOLAM HCL 5 MG/5ML IJ SOLN
INTRAMUSCULAR | Status: DC | PRN
  Administered 2017-06-28 (×2): 2 mg via INTRAVENOUS

## 2017-06-28 MED ORDER — FENTANYL CITRATE (PF) 100 MCG/2ML IJ SOLN
25.0000 ug | INTRAMUSCULAR | Status: DC | PRN
  Filled 2017-06-28: qty 1

## 2017-06-28 MED ORDER — ACETAMINOPHEN 325 MG PO TABS
325.0000 mg | ORAL_TABLET | ORAL | Status: DC | PRN
  Filled 2017-06-28: qty 2

## 2017-06-28 MED ORDER — ACETAMINOPHEN 10 MG/ML IV SOLN
INTRAVENOUS | Status: AC
  Filled 2017-06-28: qty 100

## 2017-06-28 MED ORDER — PROPOFOL 500 MG/50ML IV EMUL
INTRAVENOUS | Status: AC
  Filled 2017-06-28: qty 50

## 2017-06-28 MED ORDER — HYDROCODONE-ACETAMINOPHEN 5-325 MG PO TABS
1.0000 | ORAL_TABLET | Freq: Once | ORAL | Status: AC
  Administered 2017-06-28: 1 via ORAL
  Filled 2017-06-28: qty 1

## 2017-06-28 MED ORDER — ACETAMINOPHEN 500 MG PO TABS
1000.0000 mg | ORAL_TABLET | ORAL | Status: AC
  Administered 2017-06-28: 1000 mg via ORAL
  Filled 2017-06-28: qty 2

## 2017-06-28 MED ORDER — MIDAZOLAM HCL 2 MG/2ML IJ SOLN
INTRAMUSCULAR | Status: AC
  Filled 2017-06-28: qty 4

## 2017-06-28 MED ORDER — PROPOFOL 500 MG/50ML IV EMUL
INTRAVENOUS | Status: DC | PRN
  Administered 2017-06-28: 75 ug/kg/min via INTRAVENOUS

## 2017-06-28 MED ORDER — LIDOCAINE 2% (20 MG/ML) 5 ML SYRINGE
INTRAMUSCULAR | Status: AC
  Filled 2017-06-28: qty 5

## 2017-06-28 MED ORDER — MEPERIDINE HCL 25 MG/ML IJ SOLN
6.2500 mg | INTRAMUSCULAR | Status: DC | PRN
  Filled 2017-06-28: qty 1

## 2017-06-28 MED ORDER — OXYCODONE HCL 5 MG PO TABS
5.0000 mg | ORAL_TABLET | Freq: Once | ORAL | Status: DC | PRN
  Filled 2017-06-28: qty 1

## 2017-06-28 MED ORDER — LIDOCAINE 2% (20 MG/ML) 5 ML SYRINGE
INTRAMUSCULAR | Status: DC | PRN
  Administered 2017-06-28: 100 mg via INTRAVENOUS

## 2017-06-28 MED ORDER — PROPOFOL 10 MG/ML IV BOLUS
INTRAVENOUS | Status: DC | PRN
  Administered 2017-06-28: 30 mg via INTRAVENOUS
  Administered 2017-06-28: 70 mg via INTRAVENOUS

## 2017-06-28 MED ORDER — BUPIVACAINE-EPINEPHRINE 0.5% -1:200000 IJ SOLN
INTRAMUSCULAR | Status: DC | PRN
  Administered 2017-06-28: 30 mL

## 2017-06-28 MED ORDER — FENTANYL CITRATE (PF) 100 MCG/2ML IJ SOLN
INTRAMUSCULAR | Status: DC | PRN
  Administered 2017-06-28: 50 ug via INTRAVENOUS

## 2017-06-28 MED ORDER — HYDROCODONE-ACETAMINOPHEN 5-325 MG PO TABS: 1.0000 | ORAL_TABLET | Freq: Four times a day (QID) | ORAL | 0 refills | Status: DC | PRN

## 2017-06-28 MED ORDER — ACETAMINOPHEN 160 MG/5ML PO SOLN
325.0000 mg | ORAL | Status: DC | PRN
  Filled 2017-06-28: qty 20.3

## 2017-06-28 MED ORDER — OXYCODONE HCL 5 MG/5ML PO SOLN
5.0000 mg | Freq: Once | ORAL | Status: DC | PRN
  Filled 2017-06-28: qty 5

## 2017-06-28 MED ORDER — ONDANSETRON HCL 4 MG/2ML IJ SOLN
4.0000 mg | Freq: Once | INTRAMUSCULAR | Status: DC | PRN
  Filled 2017-06-28: qty 2

## 2017-06-28 MED ORDER — HYDROCODONE-ACETAMINOPHEN 5-325 MG PO TABS
ORAL_TABLET | ORAL | Status: AC
  Filled 2017-06-28: qty 1

## 2017-06-28 MED ORDER — CELECOXIB 200 MG PO CAPS
ORAL_CAPSULE | ORAL | Status: AC
  Filled 2017-06-28: qty 1

## 2017-06-28 MED ORDER — CELECOXIB 200 MG PO CAPS
200.0000 mg | ORAL_CAPSULE | ORAL | Status: AC
  Administered 2017-06-28: 200 mg via ORAL
  Filled 2017-06-28: qty 1

## 2017-06-28 MED ORDER — ACETAMINOPHEN 500 MG PO TABS
ORAL_TABLET | ORAL | Status: AC
  Filled 2017-06-28: qty 2

## 2017-06-28 MED ORDER — LACTATED RINGERS IV SOLN
INTRAVENOUS | Status: DC
  Administered 2017-06-28 (×2): via INTRAVENOUS
  Filled 2017-06-28: qty 1000

## 2017-06-28 MED ORDER — FENTANYL CITRATE (PF) 100 MCG/2ML IJ SOLN
INTRAMUSCULAR | Status: AC
  Filled 2017-06-28: qty 2

## 2017-06-28 MED FILL — HYDROCODON-APAP 5-325: 5-325 | 5 days supply | Qty: 20 | Fill #0

## 2017-06-28 SURGICAL SUPPLY — 47 items
BENZOIN TINCTURE PRP APPL 2/3 (GAUZE/BANDAGES/DRESSINGS) ×2 IMPLANT
BLADE EXTENDED COATED 6.5IN (ELECTRODE) IMPLANT
BLADE HEX COATED 2.75 (ELECTRODE) ×2 IMPLANT
BLADE SURG 10 STRL SS (BLADE) IMPLANT
BLADE SURG 15 STRL LF DISP TIS (BLADE) ×1 IMPLANT
BLADE SURG 15 STRL SS (BLADE) ×1
BRIEF STRETCH FOR OB PAD LRG (UNDERPADS AND DIAPERS) ×4 IMPLANT
CANISTER SUCT 3000ML PPV (MISCELLANEOUS) ×2 IMPLANT
COVER BACK TABLE 60X90IN (DRAPES) ×2 IMPLANT
COVER MAYO STAND STRL (DRAPES) ×2 IMPLANT
DECANTER SPIKE VIAL GLASS SM (MISCELLANEOUS) ×2 IMPLANT
DRAPE LAPAROTOMY 100X72 PEDS (DRAPES) ×2 IMPLANT
DRAPE UTILITY XL STRL (DRAPES) ×2 IMPLANT
ELECT REM PT RETURN 9FT ADLT (ELECTROSURGICAL) ×2
ELECTRODE REM PT RTRN 9FT ADLT (ELECTROSURGICAL) ×1 IMPLANT
GAUZE SPONGE 4X4 12PLY STRL (GAUZE/BANDAGES/DRESSINGS) ×2 IMPLANT
GAUZE SPONGE 4X4 16PLY XRAY LF (GAUZE/BANDAGES/DRESSINGS) IMPLANT
GLOVE BIO SURGEON STRL SZ 6.5 (GLOVE) ×2 IMPLANT
GLOVE INDICATOR 7.0 STRL GRN (GLOVE) ×2 IMPLANT
GOWN SPEC L3 XXLG W/TWL (GOWN DISPOSABLE) ×2 IMPLANT
HYDROGEN PEROXIDE 16OZ (MISCELLANEOUS) IMPLANT
KIT TURNOVER CYSTO (KITS) ×2 IMPLANT
LOOP VESSEL MAXI BLUE (MISCELLANEOUS) IMPLANT
NEEDLE HYPO 22GX1.5 SAFETY (NEEDLE) ×2 IMPLANT
NS IRRIG 500ML POUR BTL (IV SOLUTION) ×2 IMPLANT
PACK BASIN DAY SURGERY FS (CUSTOM PROCEDURE TRAY) ×2 IMPLANT
PAD ABD 8X10 STRL (GAUZE/BANDAGES/DRESSINGS) ×2 IMPLANT
PAD ARMBOARD 7.5X6 YLW CONV (MISCELLANEOUS) IMPLANT
PENCIL BUTTON HOLSTER BLD 10FT (ELECTRODE) ×2 IMPLANT
SPONGE HEMORRHOID 8X3CM (HEMOSTASIS) IMPLANT
SPONGE SURGIFOAM ABS GEL 12-7 (HEMOSTASIS) IMPLANT
SUCTION FRAZIER HANDLE 10FR (MISCELLANEOUS)
SUCTION TUBE FRAZIER 10FR DISP (MISCELLANEOUS) IMPLANT
SUT CHROMIC 2 0 SH (SUTURE) ×2 IMPLANT
SUT CHROMIC 3 0 SH 27 (SUTURE) IMPLANT
SUT ETHIBOND 0 (SUTURE) IMPLANT
SUT VIC AB 2-0 SH 27 (SUTURE)
SUT VIC AB 2-0 SH 27XBRD (SUTURE) IMPLANT
SUT VIC AB 3-0 SH 18 (SUTURE) IMPLANT
SUT VIC AB 4-0 P-3 18XBRD (SUTURE) IMPLANT
SUT VIC AB 4-0 P3 18 (SUTURE)
SYR BULB IRRIGATION 50ML (SYRINGE) ×2 IMPLANT
SYR CONTROL 10ML LL (SYRINGE) ×2 IMPLANT
TOWEL OR 17X24 6PK STRL BLUE (TOWEL DISPOSABLE) ×2 IMPLANT
TRAY DSU PREP LF (CUSTOM PROCEDURE TRAY) ×2 IMPLANT
TUBE CONNECTING 12X1/4 (SUCTIONS) ×2 IMPLANT
YANKAUER SUCT BULB TIP NO VENT (SUCTIONS) ×2 IMPLANT

## 2017-06-28 NOTE — Discharge Instructions (Addendum)
ANORECTAL SURGERY: POST OP INSTRUCTIONS °1. Take your usually prescribed home medications unless otherwise directed. °2. DIET: During the first few hours after surgery sip on some liquids until you are able to urinate.  It is normal to not urinate for several hours after this surgery.  If you feel uncomfortable, please contact the office for instructions.  After you are able to urinate,you may eat, if you feel like it.  Follow a light bland diet the first 24 hours after arrival home, such as soup, liquids, crackers, etc.  Be sure to include lots of fluids daily (6-8 glasses).  Avoid fast food or heavy meals, as your are more likely to get nauseated.  Eat a low fat diet the next few days after surgery.  Limit caffeine intake to 1-2 servings a day. °3. PAIN CONTROL: °a. Pain is best controlled by a usual combination of several different methods TOGETHER: °i. Muscle relaxation: Soak in a warm bath (or Sitz bath) three times a day and after bowel movements.  Continue to do this until all pain is resolved.  °ii. Over the counter pain medication °iii. Prescription pain medication °b. Most patients will experience some swelling and discomfort in the anus/rectal area and incisions.  Heat such as warm towels, sitz baths, warm baths, etc to help relax tight/sore spots and speed recovery.  Some people prefer to use ice, especially in the first couple days after surgery, as it may decrease the pain and swelling, or alternate between ice & heat.  Experiment to what works for you.  Swelling and bruising can take several weeks to resolve.  Pain can take even longer to completely resolve. °c. It is helpful to take an over-the-counter pain medication regularly for the first few weeks.  Choose one of the following that works best for you: °i. Naproxen (Aleve, etc)  Two 220mg tabs twice a day °ii. Ibuprofen (Advil, etc) Three 200mg tabs four times a day (every meal & bedtime) °d. A  prescription for pain medication (such as percocet,  oxycodone, hydrocodone, etc) should be given to you upon discharge.  Take your pain medication as prescribed.  °i. If you are having problems/concerns with the prescription medicine (does not control pain, nausea, vomiting, rash, itching, etc), please call us [phone] to see if we need to switch you to a different pain medicine that will work better for you and/or control your side effect better. °ii. If you need a refill on your pain medication, please contact your pharmacy.  They will contact our office to request authorization. Prescriptions will not be filled after 5 pm or on week-ends. °4. KEEP YOUR BOWELS REGULAR and AVOID CONSTIPATION °a. The goal is one to two soft bowel movements a day.  You should at least have a bowel movement every other day. °b. Avoid getting constipated.  Between the surgery and the pain medications, it is common to experience some constipation. This can be very painful after rectal surgery.  Increasing fluid intake and taking a fiber supplement (such as Metamucil, Citrucel, FiberCon, etc) 1-2 times a day regularly will usually help prevent this problem from occurring.  A stool softener like colace is also recommended.  This can be purchased over the counter at your pharmacy.  You can take it up to 3 times a day.  If you do not have a bowel movement after 24 hrs since your surgery, take one does of milk of magnesia.  If you still haven't had a bowel movement 8-12 hours   after that dose, take another dose.  If you don't have a bowel movement 48 hrs after surgery, purchase a Fleets enema from the drug store and administer gently per package instructions.  If you still are having trouble with your bowel movements after that, please call the office for further instructions. °c. If you develop diarrhea or have many loose bowel movements, simplify your diet to bland foods & liquids for a few days.  Stop any stool softeners and decrease your fiber supplement.  Switching to mild  anti-diarrheal medications (Kayopectate, Pepto Bismol) can help.  If this worsens or does not improve, please call us. ° °5. Wound Care °a. Remove your bandages before your first bowel movement or 8 hours after surgery.     °b. Remove any wound packing material at this tim,e as well.  You do not need to repack the wound unless instructed otherwise.  Wear an absorbent pad or soft cotton gauze in your underwear to catch any drainage and help keep the area clean. You should change this every 2-3 hours while awake. °c. Keep the area clean and dry.  Bathe / shower every day, especially after bowel movements.  Keep the area clean by showering / bathing over the incision / wound.   It is okay to soak an open wound to help wash it.  Wet wipes or showers / gentle washing after bowel movements is often less traumatic than regular toilet paper. °d. You may have some styrofoam-like soft packing in the rectum which will come out with the first bowel movement.  °e. You will often notice bleeding with bowel movements.  This should slow down by the end of the first week of surgery °f. Expect some drainage.  This should slow down, too, by the end of the first week of surgery.  Wear an absorbent pad or soft cotton gauze in your underwear until the drainage stops. °g. Do Not sit on a rubber or pillow ring.  This can make you symptoms worse.  You may sit on a soft pillow if needed.  °6. ACTIVITIES as tolerated:   °a. You may resume regular (light) daily activities beginning the next day--such as daily self-care, walking, climbing stairs--gradually increasing activities as tolerated.  If you can walk 30 minutes without difficulty, it is safe to try more intense activity such as jogging, treadmill, bicycling, low-impact aerobics, swimming, etc. °b. Save the most intensive and strenuous activity for last such as sit-ups, heavy lifting, contact sports, etc  Refrain from any heavy lifting or straining until you are off narcotics for pain  control.   °c. You may drive when you are no longer taking prescription pain medication, you can comfortably sit for long periods of time, and you can safely maneuver your car and apply brakes. °d. You may have sexual intercourse when it is comfortable.  °7. FOLLOW UP in our office °a. Please call [facility] at [phone] to set up an appointment to see your surgeon in the office for a follow-up appointment approximately 3-4 weeks after your surgery. °b. Make sure that you call for this appointment the day you arrive home to insure a convenient appointment time. °10. IF YOU HAVE DISABILITY OR FAMILY LEAVE FORMS, BRING THEM TO THE OFFICE FOR PROCESSING.  DO NOT GIVE THEM TO YOUR DOCTOR. ° ° ° ° °WHEN TO CALL US [phone]: °1. Poor pain control °2. Reactions / problems with new medications (rash/itching, nausea, etc)  °3. Fever over 101.5 F (38.5 C) °  4. Inability to urinate 5. Nausea and/or vomiting 6. Worsening swelling or bruising 7. Continued bleeding from incision. 8. Increased pain, redness, or drainage from the incision  The clinic staff is available to answer your questions during regular business hours (8:30am-5pm).  Please dont hesitate to call and ask to speak to one of our nurses for clinical concerns.   A surgeon from [facility] is always on call at the hospitals   If you have a medical emergency, go to the nearest emergency room or call 911.    [facility], [address], [place], [place], [place]  [address] ? MAIN: [phone] ? TOLL FREE: [phone] ? FAX [phone] [email or web address]   Post Anesthesia Home Care Instructions  Activity: Get plenty of rest for the remainder of the day. A responsible individual must stay with you for 24 hours following the procedure.  For the next 24 hours, DO NOT: -Drive a car -Paediatric nurse -Drink alcoholic beverages -Take any medication unless instructed by your physician -Make  any legal decisions or sign important papers.  Meals: Start with liquid foods such as gelatin or soup. Progress to regular foods as tolerated. Avoid greasy, spicy, heavy foods. If nausea and/or vomiting occur, drink only clear liquids until the nausea and/or vomiting subsides. Call your physician if vomiting continues.  Special Instructions/Symptoms: Your throat may feel dry or sore from the anesthesia or the breathing tube placed in your throat during surgery. If this causes discomfort, gargle with warm salt water. The discomfort should disappear within 24 hours.

## 2017-06-28 NOTE — Op Note (Addendum)
06/28/2017  12:13 PM  PATIENT:  [name] [name]  32 y.o. female  Patient Care Team: [name], DO as PCP - General (Family Medicine) Shelly Bombard, MD as Consulting Physician (Obstetrics and Gynecology)  PRE-OPERATIVE DIAGNOSIS:  Anal fistula  POST-OPERATIVE DIAGNOSIS:  Intersphincteric anal fistula  PROCEDURE:  ANAL EXAM UNDER ANESTHESIA with FISTULOTOMY    Surgeon(s): Leighton Ruff, MD  ASSISTANT: none   ANESTHESIA:   local and MAC  SPECIMEN:  No Specimen  DISPOSITION OF SPECIMEN:  PATHOLOGY  COUNTS:  YES  PLAN OF CARE: Discharge to home after PACU  PATIENT DISPOSITION:  PACU - hemodynamically stable.  INDICATION: 31 y.o. F with recurrent abscess after LIFT.  MR shows recurrent fistula   OR FINDINGS: intersphincteric fistula, arising from anterior midline.  DESCRIPTION: the patient was identified in the preoperative holding area and taken to the OR where they were laid on the operating room table.  MAC anesthesia was induced without difficulty. The patient was then positioned in prone jackknife position with buttocks gently taped apart.  The patient was then prepped and draped in usual sterile fashion.  SCDs were noted to be in place prior to the initiation of anesthesia. A surgical timeout was performed indicating the correct patient, procedure, positioning and need for preoperative antibiotics.  A rectal block was performed using Marcaine with epinephrine.    I began with a digital rectal exam.  There was obvious inflammation noted in the left anterior perianal region.  I then placed a Hill-Ferguson anoscope into the anal canal and evaluated this completely.  I identified an internal opening where the previous lift procedure internal incision was.  I placed a fistula probe through this and it exited into the anterior midline.  There was minimal internal sphincter involved only.  A fistulotomy was performed.  The edges were marsupialized using a 2-0 chromic  suture.  Additional Marcaine was placed underneath the incision site.  Patient was awakened from anesthesia and sent to the postanesthesia care unit in stable condition.  A dressing was applied prior to this. I have reviewed the [facility]

## 2017-06-28 NOTE — Anesthesia Postprocedure Evaluation (Signed)
Anesthesia Post Note  Patient: [name] [name]  Procedure(s) Performed: FISTULOTOMY (N/A Rectum)     Patient location during evaluation: PACU Anesthesia Type: General Level of consciousness: awake and alert Pain management: pain level controlled Vital Signs Assessment: post-procedure vital signs reviewed and stable Respiratory status: spontaneous breathing, nonlabored ventilation, respiratory function stable and patient connected to nasal cannula oxygen Cardiovascular status: blood pressure returned to baseline and stable Postop Assessment: no apparent nausea or vomiting Anesthetic complications: no    Last Vitals:  Vitals:   06/28/17 1245 06/28/17 1300  BP: 125/66 (!) 112/52  Pulse: 95 96  Resp: 15 (!) 25  Temp:    SpO2: 100% 100%    Last Pain:  Vitals:   06/28/17 1245  TempSrc:   PainSc: 0-No pain                 Navi Ewton

## 2017-06-28 NOTE — Anesthesia Procedure Notes (Signed)
Procedure Name: MAC Date/Time: 06/28/2017 11:57 AM Performed by: Bonney Aid, CRNA Pre-anesthesia Checklist: Timeout performed, Patient identified, Emergency Drugs available, Suction available and Patient being monitored Patient Re-evaluated:Patient Re-evaluated prior to induction Oxygen Delivery Method: Nasal cannula Placement Confirmation: positive ETCO2

## 2017-06-28 NOTE — H&P (Signed)
HPI: 32 yo female who presented with a recurrent abscess in her anus and was diagnosed with a fistula in ano. A seton was placed and a LIFT was done several months after this.  Since that time she has had persistent pain in the area.  MRI shows recurrent fistula.        Past Medical History:  Diagnosis Date  . Anxiety   . Depression   . Fistula-in-ano   . 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 Migraine          Past Surgical History:  Procedure Laterality Date  . INCISION AND DRAINAGE PERIRECTAL ABSCESS Left 12/21/2016   Procedure: PLACEMENT OF SETON INTO FISTULA  AND INCISION AND DRAINAGE OF PERIRECTAL ABCESS;  Surgeon: Kieth Brightly [name], MD;  Location: [facility];  Service: General;  Laterality: Left;  . IRRIGATION AND DEBRIDEMENT BUTTOCKS Left 12/28/2015   Procedure: IRRIGATION AND DEBRIDEMENT BUTTOCKS ABSCESS CHRONIC;  Surgeon: Erroll Luna, MD;  Location: [facility];  Service: General;  Laterality: Left;         Family History  Problem Relation Age of Onset  . Alcoholism Father   . Epilepsy Father   . Heart disease Father    Social History:  reports that she quit smoking about 9 years ago. Her smoking use included cigarettes. she has never used smokeless tobacco. She reports that she drinks alcohol. She reports that she does not use drugs.  Allergies:       Allergies  Allergen Reactions  . Cinnamon Other (See Comments)    Blisters on mouth and lips, coughing.    . Gabapentin     hallunciations          Medications Prior to Admission  Medication Sig Dispense Refill  . FLUoxetine (PROZAC) 20 MG capsule Take 1 capsule (20 mg total) by mouth daily. Increase to 2 pills after 1-2 weeks (Patient taking differently: Take 40 mg by mouth every morning. Increase to 2 pills after 1-2 weeks) 60 capsule 3  . ibuprofen (ADVIL,MOTRIN) 800 MG tablet Take 1 tablet (800 mg total) by mouth every 8 (eight) hours as needed. 30 tablet 0  .  naproxen sodium (ALEVE) 220 MG tablet Take 220 mg by mouth 2 (two) times daily as needed.    . medroxyPROGESTERone (DEPO-PROVERA) 150 MG/ML injection Inject 1 mL (150 mg total) into the muscle every 3 (three) months. (Patient taking differently: Inject 150 mg into the muscle every 3 (three) months. Per pt last injection 10/ 2018 (next injection 03-28-2017)) 1 mL 4  . valACYclovir (VALTREX) 1000 MG tablet Take 1 tablet (1,000 mg total) by mouth daily. (Patient taking differently: Take 1,000 mg by mouth as needed. As needed for outbreak.) 30 tablet 1    LabResultsLast48Hours        Results for orders placed or performed during the hospital encounter of 03/08/17 (from the past 48 hour(s))  Basic metabolic panel     Status: Abnormal   Collection Time: 03/07/17 11:30 AM  Result Value Ref Range   Sodium 139 135 - 145 mmol/L   Potassium 3.9 3.5 - 5.1 mmol/L   Chloride 106 101 - 111 mmol/L   CO2 25 22 - 32 mmol/L   Glucose, Bld 125 (H) 65 - 99 mg/dL   BUN 9 6 - 20 mg/dL   Creatinine, Ser 0.67 0.44 - 1.00 mg/dL   Calcium 9.4 8.9 - 10.3 mg/dL   GFR calc non Af Amer >60 >60  mL/min   GFR calc Af Amer >60 >60 mL/min    Comment: (NOTE) The eGFR has been calculated using the CKD EPI equation. This calculation has not been validated in all clinical situations. eGFR's persistently <60 mL/min signify possible Chronic Kidney Disease.    Anion gap 8 5 - 15  CBC WITH DIFFERENTIAL     Status: None   Collection Time: 03/07/17 11:30 AM  Result Value Ref Range   WBC 8.6 4.0 - 10.5 K/uL   RBC 4.98 3.87 - 5.11 MIL/uL   Hemoglobin 14.4 12.0 - 15.0 g/dL   HCT 41.7 36.0 - 46.0 %   MCV 83.7 78.0 - 100.0 fL   MCH 28.9 26.0 - 34.0 pg   MCHC 34.5 30.0 - 36.0 g/dL   RDW 12.6 11.5 - 15.5 %   Platelets 341 150 - 400 K/uL   Neutrophils Relative % 52 %   Neutro Abs 4.5 1.7 - 7.7 K/uL   Lymphocytes Relative 41 %   Lymphs Abs 3.5 0.7 - 4.0 K/uL   Monocytes Relative 6 %    Monocytes Absolute 0.5 0.1 - 1.0 K/uL   Eosinophils Relative 1 %   Eosinophils Absolute 0.1 0.0 - 0.7 K/uL   Basophils Relative 0 %   Basophils Absolute 0.0 0.0 - 0.1 K/uL  Pregnancy, urine POC     Status: None   Collection Time: 03/08/17  1:30 PM  Result Value Ref Range   Preg Test, Ur NEGATIVE NEGATIVE    Comment:        THE SENSITIVITY OF THIS METHODOLOGY IS >24 mIU/mL      ImagingResults(Last48hours)  No results found.    Review of Systems  Constitutional: Negative for chills and fever.  HENT: Negative for hearing loss.   Eyes: Negative for blurred vision and double vision.  Respiratory: Negative for cough and hemoptysis.   Cardiovascular: Negative for chest pain and palpitations.  Gastrointestinal: Negative for abdominal pain, nausea and vomiting.  Genitourinary: Negative for dysuria and urgency.  Musculoskeletal: Negative for myalgias and neck pain.  Skin: Negative for itching and rash.  Neurological: Negative for dizziness, tingling and headaches.  Endo/Heme/Allergies: Does not bruise/bleed easily.  Psychiatric/Behavioral: Negative for depression and suicidal ideas.    BP (!) 148/78   Pulse (!) 107   Temp 98.6 F (37 C) (Oral)   Resp 18   Ht _0  (1.575 m)   Wt 102.4 kg (225 lb 11.2 oz)   SpO2 97%   BMI 41.28 kg/m   Physical Exam  Vitals reviewed. Constitutional: She is oriented to person, place, and time. She appears well-developed and well-nourished.  HENT:  Head: Normocephalic and atraumatic.  Eyes: Conjunctivae and EOM are normal. Pupils are equal, round, and reactive to light.  Neck: Normal range of motion. Neck supple.  Cardiovascular: Normal rate and regular rhythm.  Respiratory: Effort normal and breath sounds normal.  GI: Soft. Bowel sounds are normal. She exhibits no distension. There is no tenderness.   Musculoskeletal: Normal range of motion.  Neurological: She is alert and oriented to person, place, and time.  Skin: Skin is  warm and dry.  Psychiatric: She has a normal mood and affect. Her behavior is normal.     Assessment/Plan 32 yo female with recurrent fistula after LIFT.  I believe this is an intersphincteric fistula and have recommended probable fistulotomy.  If there is a true trans-sphincteric recurrence, we will place a seton. She understands there is a risk of incontinence after fistulotomy.  Other risks include bleeding, pain and recurrence.

## 2017-06-28 NOTE — Transfer of Care (Signed)
Immediate Anesthesia Transfer of Care Note  Patient: [name] [name]  Procedure(s) Performed: FISTULOTOMY (N/A Rectum)  Patient Location: PACU  Anesthesia Type:MAC  Level of Consciousness: awake and oriented  Airway & Oxygen Therapy: Patient Spontanous Breathing and Patient connected to nasal cannula oxygen  Post-op Assessment: Report given to RN  Post vital signs: Reviewed and stable  Last Vitals: 134/57, 105, 22, 100%, 98.4 Vitals Value Taken Time  BP    Temp    Pulse    Resp    SpO2      Last Pain:  Vitals:   06/28/17 0859  TempSrc:   PainSc: 7       Patients Stated Pain Goal: 4 (09/32/67 1245)  Complications: None

## 2017-06-29 ENCOUNTER — Encounter (HOSPITAL_BASED_OUTPATIENT_CLINIC_OR_DEPARTMENT_OTHER): Payer: Self-pay | Admitting: General Surgery

## 2017-07-01 NOTE — Addendum Note (Signed)
Addendum  created 07/01/17 0737 by Janeece Riggers, MD   Sign clinical note

## 2017-07-01 NOTE — Anesthesia Postprocedure Evaluation (Signed)
Anesthesia Post Note  Patient: [name] [name]  Procedure(s) Performed: FISTULOTOMY (N/A Rectum)     Patient location during evaluation: PACU Anesthesia Type: MAC Level of consciousness: awake and alert Pain management: pain level controlled Vital Signs Assessment: post-procedure vital signs reviewed and stable Respiratory status: spontaneous breathing, nonlabored ventilation, respiratory function stable and patient connected to nasal cannula oxygen Cardiovascular status: stable and blood pressure returned to baseline Postop Assessment: no apparent nausea or vomiting Anesthetic complications: no    Last Vitals:  Vitals:   06/28/17 1315 06/28/17 1407  BP: 126/74 116/80  Pulse: 95 99  Resp: 18   Temp:    SpO2: 100% 100%    Last Pain:  Vitals:   06/29/17 1137  TempSrc:   PainSc: 7    Pain Goal: Patients Stated Pain Goal: 4 (06/28/17 0859)               Adya Wirz

## 2017-09-03 ENCOUNTER — Ambulatory Visit (INDEPENDENT_AMBULATORY_CARE_PROVIDER_SITE_OTHER): Payer: Self-pay

## 2017-09-03 DIAGNOSIS — Z3042 Encounter for surveillance of injectable contraceptive: Secondary | ICD-10-CM

## 2017-09-03 MED ORDER — MEDROXYPROGESTERONE ACETATE 150 MG/ML IM SUSP
150.0000 mg | Freq: Once | INTRAMUSCULAR | Status: AC
Start: 2017-09-03 — End: 2017-09-03
  Administered 2017-09-03: 150 mg via INTRAMUSCULAR

## 2017-09-03 NOTE — Progress Notes (Signed)
Pt here for depo shot. Last inj given on 06/11/17. Pt is within window. Inj given in left deltoid. Pt tolerated well. Next depo due 8/19-9/2.

## 2017-09-04 NOTE — Progress Notes (Signed)
I have reviewed the chart and agree with nursing staff's documentation of this patient's encounter.  Mora Bellman, MD 09/04/2017 8:10 AM

## 2017-11-30 ENCOUNTER — Ambulatory Visit: Payer: Self-pay

## 2017-12-04 ENCOUNTER — Ambulatory Visit (INDEPENDENT_AMBULATORY_CARE_PROVIDER_SITE_OTHER): Payer: Self-pay | Admitting: *Deleted

## 2017-12-04 DIAGNOSIS — Z3202 Encounter for pregnancy test, result negative: Secondary | ICD-10-CM

## 2017-12-04 DIAGNOSIS — Z3042 Encounter for surveillance of injectable contraceptive: Secondary | ICD-10-CM

## 2017-12-04 LAB — POCT URINE PREGNANCY: PREG TEST UR: NEGATIVE

## 2017-12-04 MED ORDER — MEDROXYPROGESTERONE ACETATE 150 MG/ML IM SUSP
150.0000 mg | Freq: Once | INTRAMUSCULAR | Status: AC
  Administered 2017-12-04: 150 mg via INTRAMUSCULAR

## 2017-12-04 NOTE — Progress Notes (Signed)
Pt is in office for depo injection. Pt is one day late for Depo.  Per Protocol, UPT in office negative. Pt provided Depo for today's visit.  Depo given, pt tolerated well. Pt advised to RTO 11/18-12/06/2017 for next depo injection.  Administrations This Visit    medroxyPROGESTERone (DEPO-PROVERA) injection 150 mg    Admin Date 12/04/2017 Action Given Dose 150 mg Route Intramuscular Administered By Valene Bors, CMA

## 2017-12-27 ENCOUNTER — Other Ambulatory Visit: Payer: Self-pay

## 2017-12-27 ENCOUNTER — Encounter (HOSPITAL_COMMUNITY): Payer: Self-pay | Admitting: *Deleted

## 2017-12-27 DIAGNOSIS — K2900 Acute gastritis without bleeding: Secondary | ICD-10-CM | POA: Insufficient documentation

## 2017-12-27 DIAGNOSIS — R112 Nausea with vomiting, unspecified: Secondary | ICD-10-CM | POA: Insufficient documentation

## 2017-12-27 DIAGNOSIS — Z87891 Personal history of nicotine dependence: Secondary | ICD-10-CM | POA: Insufficient documentation

## 2017-12-27 DIAGNOSIS — K760 Fatty (change of) liver, not elsewhere classified: Secondary | ICD-10-CM | POA: Insufficient documentation

## 2017-12-27 DIAGNOSIS — R1011 Right upper quadrant pain: Secondary | ICD-10-CM | POA: Insufficient documentation

## 2017-12-27 DIAGNOSIS — K219 Gastro-esophageal reflux disease without esophagitis: Secondary | ICD-10-CM | POA: Insufficient documentation

## 2017-12-27 LAB — COMPREHENSIVE METABOLIC PANEL
ALBUMIN: 3.9 g/dL (ref 3.5–5.0)
ALK PHOS: 39 U/L (ref 38–126)
ALT: 23 U/L (ref 0–44)
AST: 25 U/L (ref 15–41)
Anion gap: 9 (ref 5–15)
BUN: 13 mg/dL (ref 6–20)
CALCIUM: 9 mg/dL (ref 8.9–10.3)
CO2: 22 mmol/L (ref 22–32)
CREATININE: 0.71 mg/dL (ref 0.44–1.00)
Chloride: 110 mmol/L (ref 98–111)
GFR calc Af Amer: >60 mL/min (ref >60)
GFR calc non Af Amer: >60 mL/min (ref >60)
GLUCOSE: 99 mg/dL (ref 70–99)
Potassium: 3.3 mmol/L — ABNORMAL LOW (ref 3.5–5.1)
SODIUM: 141 mmol/L (ref 135–145)
Total Bilirubin: 0.6 mg/dL (ref 0.3–1.2)
Total Protein: 7.7 g/dL (ref 6.5–8.1)

## 2017-12-27 LAB — URINALYSIS, ROUTINE W REFLEX MICROSCOPIC
Bilirubin Urine: NEGATIVE
GLUCOSE, UA: NEGATIVE mg/dL
Ketones, ur: NEGATIVE mg/dL
Nitrite: NEGATIVE
PH: 6 (ref 5.0–8.0)
PROTEIN: NEGATIVE mg/dL
SPECIFIC GRAVITY, URINE: 1.019 (ref 1.005–1.030)

## 2017-12-27 LAB — CBC
HCT: 43.5 % (ref 36.0–46.0)
Hemoglobin: 15.1 g/dL — ABNORMAL HIGH (ref 12.0–15.0)
MCH: 29.1 pg (ref 26.0–34.0)
MCHC: 34.7 g/dL (ref 30.0–36.0)
MCV: 83.8 fL (ref 78.0–100.0)
PLATELETS: 345 10*3/uL (ref 150–400)
RBC: 5.19 MIL/uL — ABNORMAL HIGH (ref 3.87–5.11)
RDW: 12.5 % (ref 11.5–15.5)
WBC: 11.1 10*3/uL — ABNORMAL HIGH (ref 4.0–10.5)

## 2017-12-27 LAB — LIPASE, BLOOD: Lipase: 35 U/L (ref 11–51)

## 2017-12-27 LAB — I-STAT BETA HCG BLOOD, ED (MC, WL, AP ONLY): I-stat hCG, quantitative: <5 m[IU]/mL (ref <5)

## 2017-12-27 NOTE — ED Triage Notes (Signed)
Pt started with midline abdominal pain several days ago, now having pain in the upper right abdomen and int the right shoulder blade. Associated nausea and vomiting. No diarrhea.

## 2017-12-28 ENCOUNTER — Emergency Department (HOSPITAL_COMMUNITY)
Admission: EM | Admit: 2017-12-28 | Discharge: 2017-12-28 | Disposition: A | Discharge status: Home / Self Care | Payer: Self-pay | Attending: Emergency Medicine | Admitting: Emergency Medicine

## 2017-12-28 ENCOUNTER — Emergency Department (HOSPITAL_COMMUNITY): Payer: Self-pay

## 2017-12-28 ENCOUNTER — Encounter (HOSPITAL_COMMUNITY): Payer: Self-pay | Admitting: Emergency Medicine

## 2017-12-28 ENCOUNTER — Telehealth: Payer: Self-pay

## 2017-12-28 DIAGNOSIS — K760 Fatty (change of) liver, not elsewhere classified: Secondary | ICD-10-CM

## 2017-12-28 DIAGNOSIS — K2900 Acute gastritis without bleeding: Secondary | ICD-10-CM

## 2017-12-28 DIAGNOSIS — K219 Gastro-esophageal reflux disease without esophagitis: Secondary | ICD-10-CM

## 2017-12-28 MED ORDER — ONDANSETRON 4 MG PO TBDP
ORAL_TABLET | ORAL | Status: AC
  Filled 2017-12-28: qty 1

## 2017-12-28 MED ORDER — ONDANSETRON 8 MG PO TBDP
8.0000 mg | ORAL_TABLET | Freq: Once | ORAL | Status: AC
  Administered 2017-12-28: 8 mg via ORAL

## 2017-12-28 MED ORDER — GI COCKTAIL ~~LOC~~
30.0000 mL | Freq: Once | ORAL | Status: AC
  Administered 2017-12-28: 30 mL via ORAL

## 2017-12-28 MED ORDER — DICYCLOMINE HCL 10 MG/ML IM SOLN
20.0000 mg | Freq: Once | INTRAMUSCULAR | Status: AC
  Administered 2017-12-28: 02:00:00 via INTRAMUSCULAR
  Administered 2017-12-28: 20 mg via INTRAMUSCULAR

## 2017-12-28 MED ORDER — DICYCLOMINE HCL 10 MG/ML IM SOLN
INTRAMUSCULAR | Status: AC
Start: 2017-12-28 — End: 2017-12-28
  Filled 2017-12-28: qty 2

## 2017-12-28 MED ORDER — ONDANSETRON 8 MG PO TBDP: ORAL_TABLET | ORAL | 0 refills | Status: DC

## 2017-12-28 MED ORDER — GI COCKTAIL ~~LOC~~
ORAL | Status: AC
  Filled 2017-12-28: qty 30

## 2017-12-28 MED ORDER — OMEPRAZOLE 20 MG PO CPDR: 20.0000 mg | DELAYED_RELEASE_CAPSULE | Freq: Every day | ORAL | 0 refills | Status: DC

## 2017-12-28 NOTE — Telephone Encounter (Signed)
TCM Ed follow up call made to patient. Unable to leave message her voice mail has not been set up yet.

## 2017-12-28 NOTE — ED Provider Notes (Signed)
[place] [facility] DEPT Provider Note   CSN: [id number] Arrival date & time: 12/27/17  1957     History   Chief Complaint Chief Complaint  Patient presents with  . Abdominal Pain    HPI BRYANNA YIM is a 32 y.o. female.  The history is provided by the patient.  Abdominal Pain   This is a recurrent problem. The current episode started more than 2 days ago. The problem occurs constantly. The problem has not changed since onset.The pain is associated with an unknown factor. The pain is located in the epigastric region and RUQ. The quality of the pain is cramping. The pain is at a severity of 8/10. The pain is severe. Associated symptoms include nausea and vomiting. Pertinent negatives include fever, diarrhea, flatus, hematochezia, melena, dysuria, frequency, hematuria, headaches, arthralgias and myalgias. Nothing aggravates the symptoms. Nothing relieves the symptoms. Past workup does not include barium enema. Her past medical history is significant for GERD.    Past Medical History:  Diagnosis Date  . Anxiety   . Depression   . Fistula-in-ano   . GERD (gastroesophageal reflux disease)    occ  . 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 Migraine   . Numbness and tingling of left leg    cold sensation, painful    Patient Active Problem List   Diagnosis Date Noted  . Perianal pain 12/21/2016  . Moderate single current episode of major depressive disorder ([place]) 03/02/2016  . GERD without esophagitis 07/22/2013  . Allergic rhinitis, seasonal 07/22/2013  . Herpetic vulvovaginitis 06/26/2013  . Headache 06/26/2013  . Abnormal Pap smear of cervix   . Irregular periods     Past Surgical History:  Procedure Laterality Date  . FISTULOTOMY N/A 06/28/2017   Procedure: FISTULOTOMY;  Surgeon: Leighton Ruff, MD;  Location: [facility];  Service: General;  Laterality: N/A;  . INCISION AND DRAINAGE ABSCESS ANAL     two separate times in ED   . INCISION AND DRAINAGE PERIRECTAL ABSCESS Left 12/21/2016   Procedure: PLACEMENT OF SETON INTO FISTULA  AND INCISION AND DRAINAGE OF PERIRECTAL ABCESS;  Surgeon: Kieth Brightly [name], MD;  Location: [facility];  Service: General;  Laterality: Left;  . IRRIGATION AND DEBRIDEMENT BUTTOCKS Left 12/28/2015   Procedure: IRRIGATION AND DEBRIDEMENT BUTTOCKS ABSCESS CHRONIC;  Surgeon: Erroll Luna, MD;  Location: [facility];  Service: General;  Laterality: Left;  . LIGATION OF INTERNAL FISTULA TRACT N/A 03/08/2017   Procedure: LIGATION OF INTERNAL INTERSPHINCTERIC FISTULA TRACT;  Surgeon: Kieth Brightly, [name], MD;  Location: [facility];  Service: General;  Laterality: N/A;     OB History    Gravida  1   Para  1   Term  1   Preterm      AB      Living  1     SAB      TAB      Ectopic      Multiple      Live Births  1            Home Medications    Prior to Admission medications   Medication Sig Start Date End Date Taking? Authorizing Provider  acetaminophen (TYLENOL) 500 MG tablet Take 500 mg by mouth every 6 (six) hours as needed for mild pain.   Yes [provider]  ibuprofen (ADVIL,MOTRIN) 200 MG tablet Take 200 mg by mouth every 6 (six) hours as needed for  moderate pain.    Yes [provider]  medroxyPROGESTERone (DEPO-PROVERA) 150 MG/ML injection Inject 1 mL (150 mg total) into the muscle every 3 (three) months. Patient taking differently: Inject 150 mg into the muscle every 3 (three) months. Per pt last injection 06/2017 01/01/17  Yes Shelly Bombard, MD  naproxen sodium (ALEVE) 220 MG tablet Take 220 mg by mouth 2 (two) times daily as needed (pain).    Yes [provider]  valACYclovir (VALTREX) 1000 MG tablet Take 1 tablet (1,000 mg total) by mouth daily. Patient taking differently: Take 1,000 mg by mouth as needed. As needed for outbreak. 01/21/16  Yes [name], DO  FLUoxetine (PROZAC) 20 MG  capsule Take 1 capsule (20 mg total) by mouth daily. Increase to 2 pills after 1-2 weeks Patient not taking: Reported on 12/28/2017 03/02/16   Copland, Gay Filler, MD  HYDROcodone-acetaminophen (NORCO/VICODIN) 5-325 MG tablet Take 1 tablet by mouth every 6 (six) hours as needed. Patient not taking: Reported on 12/28/2017 9/38/10   Leighton Ruff, MD    Family History Family History  Problem Relation Age of Onset  . Alcoholism Father   . Epilepsy Father   . Heart disease Father     Social History Social History   Tobacco Use  . Smoking status: Former Smoker    Types: Cigarettes    Last attempt to quit: 03/07/2008    Years since quitting: 9.8  . Smokeless tobacco: Never Used  . Tobacco comment: per was note regular smoker only smoked very occasionally social w/ beer  Substance Use Topics  . Alcohol use: Yes    Comment: social  . Drug use: No     Allergies   Gabapentin; Cinnamon; and Hydrocodone   Review of Systems Review of Systems  Constitutional: Negative for fever.  Respiratory: Negative for chest tightness and shortness of breath.   Cardiovascular: Negative for chest pain, palpitations and leg swelling.  Gastrointestinal: Positive for abdominal pain, nausea and vomiting. Negative for diarrhea, flatus, hematochezia and melena.  Genitourinary: Negative for dysuria, frequency and hematuria.  Musculoskeletal: Negative for arthralgias and myalgias.  Neurological: Negative for headaches.  All other systems reviewed and are negative.    Physical Exam Updated Vital Signs BP 126/70 (BP Location: Left Arm)   Pulse 82 Comment: Simultaneous filing. User may not have seen previous data.  Temp 98.2 F (36.8 C) (Oral)   Resp 18   Ht 5\' 2"  (1.575 m)   Wt 102.1 kg   SpO2 100% Comment: Simultaneous filing. User may not have seen previous data.  BMI 41.15 kg/m   Physical Exam  Constitutional: She is oriented to person, place, and time. She appears well-developed and  well-nourished. No distress.  HENT:  Head: Normocephalic and atraumatic.  Nose: Nose normal.  Mouth/Throat: No oropharyngeal exudate.  Eyes: Pupils are equal, round, and reactive to light. Conjunctivae are normal.  Neck: Normal range of motion. Neck supple.  Cardiovascular: Normal rate, regular rhythm, normal heart sounds and intact distal pulses.  Pulmonary/Chest: Effort normal and breath sounds normal. No stridor. No respiratory distress. She has no wheezes. She has no rales.  Abdominal: She exhibits no mass. Bowel sounds are increased. There is no tenderness. There is no rigidity, no rebound, no guarding, no tenderness at McBurney's point and negative Murphy's sign.  Musculoskeletal: Normal range of motion. She exhibits no tenderness.  Neurological: She is alert and oriented to person, place, and time. She displays normal reflexes.  Skin: Skin is warm  and dry. Capillary refill takes less than 2 seconds.  Psychiatric: She has a normal mood and affect.     ED Treatments / Results  Labs (all labs ordered are listed, but only abnormal results are displayed) Results for orders placed or performed during the hospital encounter of 12/28/17  Lipase, blood  Result Value Ref Range   Lipase 35 11 - 51 U/L  Comprehensive metabolic panel  Result Value Ref Range   Sodium 141 135 - 145 mmol/L   Potassium 3.3 (L) 3.5 - 5.1 mmol/L   Chloride 110 98 - 111 mmol/L   CO2 22 22 - 32 mmol/L   Glucose, Bld 99 70 - 99 mg/dL   BUN 13 6 - 20 mg/dL   Creatinine, Ser 0.71 0.44 - 1.00 mg/dL   Calcium 9.0 8.9 - 10.3 mg/dL   Total Protein 7.7 6.5 - 8.1 g/dL   Albumin 3.9 3.5 - 5.0 g/dL   AST 25 15 - 41 U/L   ALT 23 0 - 44 U/L   Alkaline Phosphatase 39 38 - 126 U/L   Total Bilirubin 0.6 0.3 - 1.2 mg/dL   GFR calc non Af Amer >60 >60 mL/min   GFR calc Af Amer >60 >60 mL/min   Anion gap 9 5 - 15  CBC  Result Value Ref Range   WBC 11.1 (H) 4.0 - 10.5 K/uL   RBC 5.19 (H) 3.87 - 5.11 MIL/uL   Hemoglobin  15.1 (H) 12.0 - 15.0 g/dL   HCT 43.5 36.0 - 46.0 %   MCV 83.8 78.0 - 100.0 fL   MCH 29.1 26.0 - 34.0 pg   MCHC 34.7 30.0 - 36.0 g/dL   RDW 12.5 11.5 - 15.5 %   Platelets 345 150 - 400 K/uL  Urinalysis, Routine w reflex microscopic  Result Value Ref Range   Color, Urine YELLOW YELLOW   APPearance CLEAR CLEAR   Specific Gravity, Urine 1.019 1.005 - 1.030   pH 6.0 5.0 - 8.0   Glucose, UA NEGATIVE NEGATIVE mg/dL   Hgb urine dipstick SMALL (A) NEGATIVE   Bilirubin Urine NEGATIVE NEGATIVE   Ketones, ur NEGATIVE NEGATIVE mg/dL   Protein, ur NEGATIVE NEGATIVE mg/dL   Nitrite NEGATIVE NEGATIVE   Leukocytes, UA TRACE (A) NEGATIVE   RBC / HPF 0-5 0 - 5 RBC/hpf   WBC, UA 0-5 0 - 5 WBC/hpf   Bacteria, UA FEW (A) NONE SEEN   Squamous Epithelial / LPF 0-5 0 - 5   Mucus PRESENT   I-Stat beta hCG blood, ED  Result Value Ref Range   I-stat hCG, quantitative <5.0 <5 mIU/mL   Comment 3           US Abdomen Limited  Result Date: 12/28/2017 CLINICAL DATA:  32 year old female with right upper quadrant abdominal pain. EXAM: ULTRASOUND ABDOMEN LIMITED RIGHT UPPER QUADRANT COMPARISON:  None. FINDINGS: Gallbladder: No gallstones or wall thickening visualized. No sonographic Murphy sign noted by sonographer. Common bile duct: Diameter: 3 mm Liver: Diffuse increased liver echogenicity most consistent with fatty infiltration. Portal vein is patent on color Doppler imaging with normal direction of blood flow towards the liver. IMPRESSION: 1. No gallstone. 2. Moderate to advanced fatty liver. Electronically Signed   By: Anner Crete M.D.   On: 12/28/2017 04:09    EKG None  Radiology US Abdomen Limited  Result Date: 12/28/2017 CLINICAL DATA:  32 year old female with right upper quadrant abdominal pain. EXAM: ULTRASOUND ABDOMEN LIMITED RIGHT UPPER QUADRANT COMPARISON:  None. FINDINGS:  Gallbladder: No gallstones or wall thickening visualized. No sonographic Murphy sign noted by sonographer. Common bile  duct: Diameter: 3 mm Liver: Diffuse increased liver echogenicity most consistent with fatty infiltration. Portal vein is patent on color Doppler imaging with normal direction of blood flow towards the liver. IMPRESSION: 1. No gallstone. 2. Moderate to advanced fatty liver. Electronically Signed   By: Anner Crete M.D.   On: 12/28/2017 04:09    Procedures Procedures (including critical care time)  Medications Ordered in ED Medications  ondansetron (ZOFRAN-ODT) disintegrating tablet 8 mg (8 mg Oral Given 12/28/17 0136)  gi cocktail (Maalox,Lidocaine,Donnatal) (30 mLs Oral Given 12/28/17 0138)  dicyclomine (BENTYL) injection 20 mg ( Intramuscular Given 12/28/17 0139)       Final Clinical Impressions(s) / ED Diagnoses  Exam and vitals are benign and reassuring.    No gall stones of signs of cholecystitis.  I suspect this is GERD/ gastritis.  Will provide RX for zofran and omeprazole.  And a gerd friendly diet to follow.  Strict return precautions given.    Return for weakness, numbness, changes in vision or speech, fevers >100.4 unrelieved by medication, shortness of breath, intractable vomiting, or diarrhea, abdominal pain, Inability to tolerate liquids or food, cough, altered mental status or any concerns. No signs of systemic illness or infection. The patient is nontoxic-appearing on exam and vital signs are within normal limits.    I have reviewed the triage vital signs and the nursing notes. Pertinent labs &imaging results that were available during my care of the patient were reviewed by me and considered in my medical decision making (see chart for details).  After history, exam, and medical workup I feel the patient has been appropriately medically screened and is safe for discharge home. Pertinent diagnoses were discussed with the patient. Patient was given return precautions.    Zahki Hoogendoorn, MD 12/28/17 [phone]

## 2018-01-15 ENCOUNTER — Ambulatory Visit: Payer: Self-pay | Admitting: *Deleted

## 2018-01-15 NOTE — Telephone Encounter (Addendum)
Pt called with having abd pain on her right side under her breast that goes up to her rib cage and shoulder. Pt was seen in the ED on 09/26-27/19 for this. She was told it was GERD and hepatic steatosis and prescribed omeprazole and zofran.  And no problems with her gall bladder. She was also given a diet  GERD diet to follow. Pt stated that she has tried to follow the diet.  Per her discharge instructions she was told to come back to the ED if symptoms returned. Pt did not want to go back to the ED. She is hoping to get a referral for GI.  She states the pain is a 10 and it is constant. No diarrhea or constipation. No fever. She states the pain is a stabbing pain. Abd is bloated. She has been diagnosed with IBS. Appointment scheduled per protocol. Pt voiced understanding.  Reason for Disposition . [1] MODERATE pain (e.g., interferes with normal activities) AND [2] comes and goes (cramps) AND [3] present > 24 hours  (Exception: pain with Vomiting or Diarrhea - see that Guideline) . [1] MILD-MODERATE pain AND [2] constant AND [3] present > 2 hours  Answer Assessment - Initial Assessment Questions 1. LOCATION: "Where does it hurt?"      Upper right sdie 2. RADIATION: "Does the pain shoot anywhere else?" (e.g., chest, back)     Yes thru to ribs to shoulder blades 3. ONSET: "When did the pain begin?" (e.g., minutes, hours or days ago)      2 weeks ago 4. SUDDEN: "Gradual or sudden onset?"     Sudden pain 5. PATTERN "Does the pain come and go, or is it constant?"    - If constant: "Is it getting better, staying the same, or worsening?"      (Note: Constant means the pain never goes away completely; most serious pain is constant and it progresses)     - If intermittent: "How long does it last?" "Do you have pain now?"     (Note: Intermittent means the pain goes away completely between bouts)     Constant, pain # 10 6. SEVERITY: "How bad is the pain?"  (e.g., Scale 1-10; mild, moderate, or  severe)    - MILD (1-3): doesn't interfere with normal activities, abdomen soft and not tender to touch     - MODERATE (4-7): interferes with normal activities or awakens from sleep, tender to touch     - SEVERE (8-10): excruciating pain, doubled over, unable to do any normal activities       Pain #10 7. RECURRENT SYMPTOM: "Have you ever had this type of abdominal pain before?" If so, ask: "When was the last time?" and "What happened that time?"  no      8. AGGRAVATING FACTORS: "Does anything seem to cause this pain?" (e.g., foods, stress, alcohol)     Worst when she eats, gets worst as the day goes on 9. CARDIAC SYMPTOMS: "Do you have any of the following symptoms: chest pain, difficulty breathing, sweating, nausea?"     no 10. OTHER SYMPTOMS: "Do you have any other symptoms?" (e.g., fever, vomiting, diarrhea)       no 11. PREGNANCY: "Is there any chance you are pregnant?" "When was your last menstrual period?"       On depo for 4 years  Protocols used: ABDOMINAL PAIN - UPPER-A-AH

## 2018-01-15 NOTE — Telephone Encounter (Deleted)
Appointment scheduled per protocol. Pt voiced understanding.

## 2018-01-15 NOTE — Telephone Encounter (Signed)
   Additional Information . Commented on: [1] MILD-MODERATE pain AND [2] constant AND [3] present > 2 hours    Pt has had pain since before 12/27/17. Pain has continued even after being seen in the ED. . Commented on: [1] MODERATE pain (e.g., interferes with normal activities) AND [2] comes and goes (cramps) AND [3] present > 24 hours  (Exception: pain with Vomiting or Diarrhea - see that Guideline)    error  Protocols used: ABDOMINAL PAIN - UPPER-A-AH

## 2018-01-16 ENCOUNTER — Encounter: Payer: Self-pay | Admitting: Gastroenterology

## 2018-01-16 ENCOUNTER — Ambulatory Visit (INDEPENDENT_AMBULATORY_CARE_PROVIDER_SITE_OTHER): Payer: 59 | Admitting: Family Medicine

## 2018-01-16 ENCOUNTER — Encounter: Payer: Self-pay | Admitting: Family Medicine

## 2018-01-16 VITALS — BP 118/78 | HR 95 | Temp 98.5°F | Ht 62.0 in | Wt 226.0 lb

## 2018-01-16 DIAGNOSIS — E876 Hypokalemia: Secondary | ICD-10-CM

## 2018-01-16 DIAGNOSIS — G8929 Other chronic pain: Secondary | ICD-10-CM | POA: Diagnosis not present

## 2018-01-16 DIAGNOSIS — R109 Unspecified abdominal pain: Secondary | ICD-10-CM

## 2018-01-16 LAB — BASIC METABOLIC PANEL
BUN: 11 mg/dL (ref 6–23)
CO2: 25 mEq/L (ref 19–32)
Calcium: 9.3 mg/dL (ref 8.4–10.5)
Chloride: 109 mEq/L (ref 96–112)
Creatinine, Ser: 0.71 mg/dL (ref 0.40–1.20)
GFR: 100.42 mL/min (ref >60.00)
GLUCOSE: 90 mg/dL (ref 70–99)
Potassium: 4.2 mEq/L (ref 3.5–5.1)
Sodium: 140 mEq/L (ref 135–145)

## 2018-01-16 MED ORDER — AMITRIPTYLINE HCL 25 MG PO TABS: 25.0000 mg | ORAL_TABLET | Freq: Every day | ORAL | 2 refills | Status: DC

## 2018-01-16 NOTE — Patient Instructions (Addendum)
If you do not hear anything about your referral in the next 1-2 weeks, call our office and ask for an update.  Keep a food diary to see what foods set off your symptoms.  Take medicine at night.  Let us know if you need anything.

## 2018-01-16 NOTE — Progress Notes (Signed)
Chief Complaint  Patient presents with  . Abdominal Pain    [name] [name] is here for abdominal pain.  Duration: 10 months Nighttime awakenings? Yes Bleeding? No Weight loss? No  Started in lower quads, now everywhere, worse in RUQ. Went to ED, labs and imaging ([place]) unremarkable. D/c'd with Prilosec and Zofran, little help. 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  Palliation: None Provocation: Laying on the R side, eating certain foods, moving Associated symptoms: nausea and vomiting, alt constipation/diarrhea Denies: fever Treatment to date: PPI, Zofran, Bentyl  ROS: Constitutional: No fevers GI: No N/V/D/C, no bleeding + pain  Past Medical History:  Diagnosis Date  . Anxiety   . Depression   . Fistula-in-ano   . GERD (gastroesophageal reflux disease)    occ  . 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 Migraine   . Numbness and tingling of left leg    cold sensation, painful   Family History  Problem Relation Age of Onset  . Alcoholism Father   . Epilepsy Father   . Heart disease Father    BP 118/78 (BP Location: Left Arm, Patient Position: Sitting, Cuff Size: Large)   Pulse 95   Temp 98.5 F (36.9 C) (Oral)   Ht 5\' 2"  (1.575 m)   Wt 226 lb (102.5 kg)   SpO2 97%   BMI 41.34 kg/m  Gen.: Awake, alert, appears stated age 32: Mucous membranes moist without mucosal lesions Heart: Regular rate and rhythm without murmurs Lungs: Clear auscultation bilaterally, no rales or wheezing, normal effort without accessory muscle use. Abdomen: Bowel sounds are present. Abdomen is soft, nontender, nondistended, no masses or organomegaly. Negative Murphy's, Rovsing's, McBurney's, and Carnett's sign. Psych: Age appropriate judgment and insight. Normal mood and affect.  Chronic abdominal pain - Plan: Ambulatory referral to Gastroenterology, amitriptyline (ELAVIL) 25 MG tablet  Hypokalemia - Plan: Basic metabolic panel  Orders as above. Refer to GI given  nighttime awakenings.  Start TCA for possible IBS in meantime. Reck K.  F/u in 4-6 weeks. Pt voiced understanding and agreement to the plan.  [place], DO 01/16/18 9:59 AM

## 2018-01-16 NOTE — Progress Notes (Signed)
Pre visit review using our clinic review tool, if applicable. No additional management support is needed unless otherwise documented below in the visit note. 

## 2018-01-21 ENCOUNTER — Other Ambulatory Visit: Payer: Self-pay | Admitting: Family Medicine

## 2018-01-21 ENCOUNTER — Ambulatory Visit: Payer: 59 | Admitting: Gastroenterology

## 2018-01-21 ENCOUNTER — Other Ambulatory Visit (INDEPENDENT_AMBULATORY_CARE_PROVIDER_SITE_OTHER): Payer: 59

## 2018-01-21 ENCOUNTER — Encounter: Payer: Self-pay | Admitting: Gastroenterology

## 2018-01-21 VITALS — BP 128/82 | HR 107 | Ht 62.0 in | Wt 222.5 lb

## 2018-01-21 DIAGNOSIS — R112 Nausea with vomiting, unspecified: Secondary | ICD-10-CM | POA: Diagnosis not present

## 2018-01-21 DIAGNOSIS — R1084 Generalized abdominal pain: Secondary | ICD-10-CM

## 2018-01-21 DIAGNOSIS — R195 Other fecal abnormalities: Secondary | ICD-10-CM | POA: Diagnosis not present

## 2018-01-21 DIAGNOSIS — K604 Rectal fistula: Secondary | ICD-10-CM | POA: Diagnosis not present

## 2018-01-21 DIAGNOSIS — R1013 Epigastric pain: Secondary | ICD-10-CM

## 2018-01-21 MED ORDER — SOD PICOSULFATE-MAG OX-CIT ACD 10-3.5-12 MG-GM -GM/160ML PO SOLN: 1.0000 | ORAL | 0 refills | Status: DC

## 2018-01-21 NOTE — Patient Instructions (Addendum)
If you are age 33 or older, your body mass index should be between 23-30. Your Body mass index is 40.7 kg/m. If this is out of the aforementioned range listed, please consider follow up with your Primary Care Provider.  If you are age 49 or younger, your body mass index should be between 19-25. Your Body mass index is 40.7 kg/m. If this is out of the aformentioned range listed, please consider follow up with your Primary Care Provider.   You have been scheduled for an endoscopy and colonoscopy. Please follow the written instructions given to you at your visit today. Please pick up your prep supplies at the pharmacy within the next 1-3 days. If you use inhalers (even only as needed), please bring them with you on the day of your procedure. Your physician has requested that you go to www.startemmi.com and enter the access code given to you at your visit today. This web site gives a general overview about your procedure. However, you should still follow specific instructions given to you by our office regarding your preparation for the procedure.  We have sent the following medications to your pharmacy for you to pick up at your convenience: [email or web address]  Please purchase the following medications over the counter and take as directed: Colace  Please go to the lab on the 2nd floor [address] before you leave the office today.   It was a pleasure to see you today!  Vito Cirigliano, D.O.

## 2018-01-21 NOTE — Progress Notes (Signed)
Chief Complaint: Abdominal pain   Referring Provider:     [name], DO   HPI:     [name] [name] is a 32 y.o. female referred to the [facility] for evaluation of abdominal pain and alternating bowel habits.  She has a longer standing history of GI symptoms as outlined below, along with more recent acute symptoms.  She states her more recent sxs started in Sept with what she thought it was a "stomach bug" with n/v, and RUQ/MEG stabbing pain. Pain radiates around her rib cage and occasionally to right shoulder.  Symptoms last throughout the day with positive nocturnal symptoms.  Symptoms not related to p.o. intake, types of food, activities.  No alleviating factors.  She presented to the emergency department with these symptoms on 12/28/2017 and evaluation was largely unrevealing to include CBC (mild leukocytosis at 11.1), CMP (K+ 3.3, which was normal on repeat last week), lipase.  RUQ ultrasound only notable for fatty liver infiltration but otherwise normal GB and no duct dilatation.  She was discharged with Prilosec and Zofran without appreciable clinical improvement.  Followed up with her PCM on 01/16/2018 for ongoing symptoms.  Prescribed Elavil 25 mg and referral to GI. No change with Elavil but feels groggy/foggy.   Pain still present. Lasts all day long and worsens through the day. +nausea all day, with rare emesis now, but +dry heaves.   Has had a longer standing history of GI issues for many years. More LGI sxs in the past described as b/l LQ cramping, spasm type pain. Less focal and with associated bloating. Sxs increased in the last year or so. Sxs not resolved with BM. Has had long hx of alternating BMs.  Admittedly with a history of stress, depression, anxiety but unclear to what degree this plays a role in her GI symptoms.  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 Leighton Ruff.  - 72/5366: "Abscess" starts - 12/2015: First surgery - 12/2016: LIFT surgery - 03/2017: LIFT surgery #2 - 06/2017: Fistulotomy Minor incontinence of stool since last fistulotomy. Still with pain at surgical site.   PGM/PGF both died from cirrhosis (PGM drank). Mother with "GI issues". Otherwise, no known family history of CRC, GI malignancy, pancreatic disease, or IBD.   No previous EGD or colonoscopy.   Past Medical History:  Diagnosis Date  . Anxiety   . Depression   . Fistula-in-ano   . GERD (gastroesophageal reflux disease)    occ  . 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 Migraine   . Numbness and tingling of left leg    cold sensation, painful     Past Surgical History:  Procedure Laterality Date  . FISTULOTOMY N/A 06/28/2017   Procedure: FISTULOTOMY;  Surgeon: Leighton Ruff, MD;  Location: [facility];  Service: General;  Laterality: N/A;  . INCISION AND DRAINAGE ABSCESS ANAL     two separate times in ED  . INCISION AND DRAINAGE PERIRECTAL ABSCESS Left 12/21/2016   Procedure: PLACEMENT OF SETON INTO FISTULA  AND INCISION AND DRAINAGE OF PERIRECTAL ABCESS;  Surgeon: Kieth Brightly [name], MD;  Location: [facility];  Service: General;  Laterality: Left;  . IRRIGATION AND DEBRIDEMENT BUTTOCKS Left 12/28/2015   Procedure: IRRIGATION  AND DEBRIDEMENT BUTTOCKS ABSCESS CHRONIC;  Surgeon: Erroll Luna, MD;  Location: [facility];  Service: General;  Laterality: Left;  . LIGATION OF INTERNAL FISTULA TRACT N/A 03/08/2017   Procedure: LIGATION OF INTERNAL INTERSPHINCTERIC FISTULA TRACT;  Surgeon: Kieth Brightly, [name], MD;  Location: [facility];  Service: General;  Laterality: N/A;   Family History  Problem Relation Age of Onset  . Alcoholism Father   . Epilepsy Father   . Heart disease Father    Social History    Tobacco Use  . Smoking status: Former Smoker    Types: Cigarettes    Last attempt to quit: 03/07/2008    Years since quitting: 9.8  . Smokeless tobacco: Never Used  . Tobacco comment: per was note regular smoker only smoked very occasionally social w/ beer  Substance Use Topics  . Alcohol use: Yes    Comment: social  . Drug use: No   Current Outpatient Medications  Medication Sig Dispense Refill  . acetaminophen (TYLENOL) 500 MG tablet Take 500 mg by mouth every 6 (six) hours as needed for mild pain.    Marland Kitchen amitriptyline (ELAVIL) 25 MG tablet Take 1 tablet (25 mg total) by mouth at bedtime. 30 tablet 2  . ibuprofen (ADVIL,MOTRIN) 200 MG tablet Take 200 mg by mouth every 6 (six) hours as needed for moderate pain.      No current facility-administered medications for this visit.    Allergies  Allergen Reactions  . Gabapentin     hallunciations  . Cinnamon Other (See Comments)    Blisters on mouth and lips, coughing.    Marland Kitchen Hydrocodone Diarrhea     Review of Systems: All systems reviewed and negative except where noted in HPI.     Physical Exam:    Wt Readings from Last 3 Encounters:  01/16/18 226 lb (102.5 kg)  12/27/17 225 lb (102.1 kg)  06/28/17 225 lb 11.2 oz (102.4 kg)    There were no vitals taken for this visit. Constitutional:  Pleasant, in no acute distress. Psychiatric: Normal mood and affect. Behavior is normal. EENT: Pupils normal.  Conjunctivae are normal. No scleral icterus. Neck supple. No cervical LAD. Cardiovascular: Normal rate, regular rhythm. No edema Pulmonary/chest: Effort normal and breath sounds normal. No wheezing, rales or rhonchi. Abdominal: Soft, nondistended, nontender. Bowel sounds active throughout. There are no masses palpable. No hepatomegaly. Neurological: Alert and oriented to person place and time. Skin: Skin is warm and dry. No rashes noted. Rectal: Exam deferred by patient to time of colonoscopy.    ASSESSMENT AND PLAN;    [name] [name] is a 32 y.o. female with a history of intersphincteric anal fistula requiring fistulotomy earlier this year presenting with abdominal pain.  She has both a longer standing history of intermittent, typically lower abdominal pain with a cramping quality with associated changes in bowel habits, but now with superimposed intense, stabbing RUQ/MEG pain which seems unremitting. Discussed the broad DDX of abdominal pain, but given her perianal disease/fistula requiring surgical intervention earlier this year, have increased index of suspicion for IBD (?  Superimposed IBS ) and will evaluate as below:  - EGD with random and directed biopsies -Colonoscopy with random and directed biopsies - ESR/CRP, fecal lactoferrin - Ok to stop Elavil given no clinical improvement - Repeat CBC to ensure normalization of previous mild leukocytosis -If above evaluation is unrevealing, will have low threshold for CT enterography - Pending endoscopic findings, may need referral back to colorectal surgery for  reevaluation -To follow-up with me in clinic following EGD and colonoscopy   Lavena Bullion, DO, FACG  01/21/2018, 2:25 PM   [place], [place]*

## 2018-01-21 NOTE — Progress Notes (Signed)
D/C elavil given failure of benefit.

## 2018-01-22 LAB — CBC WITH DIFFERENTIAL/PLATELET
BASOS PCT: 0.9 % (ref 0.0–3.0)
Basophils Absolute: 0.1 10*3/uL (ref 0.0–0.1)
EOS PCT: 2.8 % (ref 0.0–5.0)
Eosinophils Absolute: 0.2 10*3/uL (ref 0.0–0.7)
HCT: 44.1 % (ref 36.0–46.0)
HEMOGLOBIN: 15.1 g/dL — AB (ref 12.0–15.0)
Lymphocytes Relative: 42.4 % (ref 12.0–46.0)
Lymphs Abs: 3.7 10*3/uL (ref 0.7–4.0)
MCHC: 34.3 g/dL (ref 30.0–36.0)
MCV: 85.7 fl (ref 78.0–100.0)
MONO ABS: 0.6 10*3/uL (ref 0.1–1.0)
Monocytes Relative: 6.8 % (ref 3.0–12.0)
NEUTROS ABS: 4.1 10*3/uL (ref 1.4–7.7)
NEUTROS PCT: 47.1 % (ref 43.0–77.0)
Platelets: 336 10*3/uL (ref 150.0–400.0)
RBC: 5.15 Mil/uL — ABNORMAL HIGH (ref 3.87–5.11)
RDW: 13 % (ref 11.5–15.5)
WBC: 8.8 10*3/uL (ref 4.0–10.5)

## 2018-01-22 LAB — SEDIMENTATION RATE: SED RATE: 9 mm/h (ref 0–20)

## 2018-01-22 LAB — C-REACTIVE PROTEIN: CRP: 0.3 mg/dL — ABNORMAL LOW (ref 0.5–20.0)

## 2018-01-23 ENCOUNTER — Other Ambulatory Visit: Payer: 59

## 2018-01-23 ENCOUNTER — Encounter: Payer: Self-pay | Admitting: Gastroenterology

## 2018-01-23 DIAGNOSIS — R195 Other fecal abnormalities: Secondary | ICD-10-CM | POA: Diagnosis not present

## 2018-01-23 DIAGNOSIS — K604 Rectal fistula: Secondary | ICD-10-CM | POA: Diagnosis not present

## 2018-01-23 DIAGNOSIS — R1084 Generalized abdominal pain: Secondary | ICD-10-CM

## 2018-01-23 DIAGNOSIS — R112 Nausea with vomiting, unspecified: Secondary | ICD-10-CM

## 2018-01-25 LAB — FECAL LACTOFERRIN, QUANT
Fecal Lactoferrin: NEGATIVE
MICRO NUMBER:: 91275122
SPECIMEN QUALITY:: ADEQUATE

## 2018-01-31 ENCOUNTER — Other Ambulatory Visit: Payer: Self-pay

## 2018-01-31 MED ORDER — ONDANSETRON HCL 4 MG PO TABS: 4.0000 mg | ORAL_TABLET | Freq: Four times a day (QID) | ORAL | 1 refills | Status: DC | PRN

## 2018-01-31 NOTE — Progress Notes (Signed)
Sent Rx for Zofran every 6 hours as needed with no refills.

## 2018-02-04 ENCOUNTER — Other Ambulatory Visit: Payer: Self-pay

## 2018-02-04 DIAGNOSIS — Z3042 Encounter for surveillance of injectable contraceptive: Secondary | ICD-10-CM

## 2018-02-04 MED ORDER — MEDROXYPROGESTERONE ACETATE 150 MG/ML IM SUSP: 150.0000 mg | INTRAMUSCULAR | 0 refills | Status: DC

## 2018-02-04 NOTE — Progress Notes (Signed)
Pt on schedule for depo injection Rx sent  To requested pharmacy  appt 02/25/18 for depo and annual

## 2018-02-06 ENCOUNTER — Encounter: Payer: Self-pay | Admitting: Gastroenterology

## 2018-02-06 ENCOUNTER — Ambulatory Visit (AMBULATORY_SURGERY_CENTER): Payer: 59 | Admitting: Gastroenterology

## 2018-02-06 VITALS — BP 147/61 | HR 82 | Temp 98.0°F | Resp 16 | Ht 62.0 in | Wt 222.0 lb

## 2018-02-06 DIAGNOSIS — K621 Rectal polyp: Secondary | ICD-10-CM

## 2018-02-06 DIAGNOSIS — K2100 Gastro-esophageal reflux disease with esophagitis, without bleeding: Secondary | ICD-10-CM

## 2018-02-06 DIAGNOSIS — D123 Benign neoplasm of transverse colon: Secondary | ICD-10-CM

## 2018-02-06 DIAGNOSIS — R1084 Generalized abdominal pain: Secondary | ICD-10-CM

## 2018-02-06 DIAGNOSIS — K604 Rectal fistula: Secondary | ICD-10-CM

## 2018-02-06 DIAGNOSIS — K635 Polyp of colon: Secondary | ICD-10-CM | POA: Diagnosis not present

## 2018-02-06 DIAGNOSIS — K209 Esophagitis, unspecified: Secondary | ICD-10-CM | POA: Diagnosis not present

## 2018-02-06 DIAGNOSIS — R197 Diarrhea, unspecified: Secondary | ICD-10-CM

## 2018-02-06 DIAGNOSIS — D128 Benign neoplasm of rectum: Secondary | ICD-10-CM

## 2018-02-06 HISTORY — PX: COLONOSCOPY: SHX174

## 2018-02-06 MED ORDER — SODIUM CHLORIDE 0.9 % IV SOLN: 500.0000 mL | Freq: Once | INTRAVENOUS | Status: DC

## 2018-02-06 MED ORDER — PANTOPRAZOLE SODIUM 20 MG PO TBEC: DELAYED_RELEASE_TABLET | ORAL | 1 refills | Status: DC

## 2018-02-06 NOTE — Patient Instructions (Signed)
Protonix order sent to your pharmacy for pick up   Information on Esophagitis given to you today  Information on polyps given to you today  Await pathology results   Return to appointment wit Dr Bryan Lemma in 4-6 weeks ( office will set this up or you can call and make this)   YOU HAD AN ENDOSCOPIC PROCEDURE TODAY AT [place]:   Refer to the procedure report that was given to you for any specific questions about what was found during the examination.  If the procedure report does not answer your questions, please call your gastroenterologist to clarify.  If you requested that your care partner not be given the details of your procedure findings, then the procedure report has been included in a sealed envelope for you to review at your convenience later.  YOU SHOULD EXPECT: Some feelings of bloating in the abdomen. Passage of more gas than usual.  Walking can help get rid of the air that was put into your GI tract during the procedure and reduce the bloating. If you had a lower endoscopy (such as a colonoscopy or flexible sigmoidoscopy) you may notice spotting of blood in your stool or on the toilet paper. If you underwent a bowel prep for your procedure, you may not have a normal bowel movement for a few days.  Please Note:  You might notice some irritation and congestion in your nose or some drainage.  This is from the oxygen used during your procedure.  There is no need for concern and it should clear up in a day or so.  SYMPTOMS TO REPORT IMMEDIATELY:   Following lower endoscopy (colonoscopy or flexible sigmoidoscopy):  Excessive amounts of blood in the stool  Significant tenderness or worsening of abdominal pains  Swelling of the abdomen that is new, acute  Fever of 100F or higher   Following upper endoscopy (EGD)  Vomiting of blood or coffee ground material  New chest pain or pain under the shoulder blades  Painful or persistently difficult  swallowing  New shortness of breath  Fever of 100F or higher  Black, tarry-looking stools  For urgent or emergent issues, a gastroenterologist can be reached at any hour by calling [phone].   DIET:  We do recommend a small meal at first, but then you may proceed to your regular diet.  Drink plenty of fluids but you should avoid alcoholic beverages for 24 hours.  ACTIVITY:  You should plan to take it easy for the rest of today and you should NOT DRIVE or use heavy machinery until tomorrow (because of the sedation medicines used during the test).    FOLLOW UP: Our staff will call the number listed on your records the next business day following your procedure to check on you and address any questions or concerns that you may have regarding the information given to you following your procedure. If we do not reach you, we will leave a message.  However, if you are feeling well and you are not experiencing any problems, there is no need to return our call.  We will assume that you have returned to your regular daily activities without incident.  If any biopsies were taken you will be contacted by phone or by letter within the next 1-3 weeks.  Please call us at [phone] if you have not heard about the biopsies in 3 weeks.    SIGNATURES/CONFIDENTIALITY: You and/or your care partner have signed paperwork which will be  entered into your electronic medical record.  These signatures attest to the fact that that the information above on your After Visit Summary has been reviewed and is understood.  Full responsibility of the confidentiality of this discharge information lies with you and/or your care-partner. 

## 2018-02-06 NOTE — Progress Notes (Signed)
Called to room to assist during endoscopic procedure.  Patient ID and intended procedure confirmed with present staff. Received instructions for my participation in the procedure from the performing physician.  

## 2018-02-06 NOTE — Op Note (Signed)
[place] Patient Name: [name] [name] Procedure Date: 02/06/2018 2:54 PM MRN: [id number] Endoscopist: Gerrit Heck , MD Age: 32 Referring MD:  Date of Birth: 1985/05/09 Gender: Female Account #: [id number] Procedure:                Upper GI endoscopy Indications:              Epigastric abdominal pain, Abdominal pain in the                            right upper quadrant, Abdominal bloating, Diarrhea Medicines:                Monitored Anesthesia Care Procedure:                Pre-Anesthesia Assessment:                           - Prior to the procedure, a History and Physical                            was performed, and patient medications and                            allergies were reviewed. The patient's tolerance of                            previous anesthesia was also reviewed. The risks                            and benefits of the procedure and the sedation                            options and risks were discussed with the patient.                            All questions were answered, and informed consent                            was obtained. Prior Anticoagulants: The patient has                            taken no previous anticoagulant or antiplatelet                            agents. ASA Grade Assessment: II - A patient with                            mild systemic disease. After reviewing the risks                            and benefits, the patient was deemed in                            satisfactory condition to undergo the procedure.  After obtaining informed consent, the endoscope was                            passed under direct vision. Throughout the                            procedure, the patient's blood pressure, pulse, and                            oxygen saturations were monitored continuously. The                            Model GIF-HQ190 [id number]) scope was introduced   through the mouth, and advanced to the third part                            of duodenum. The upper GI endoscopy was                            accomplished without difficulty. The patient                            tolerated the procedure well. Scope In: Scope Out: Findings:                 LA Grade A (one or more mucosal breaks less than 5                            mm, not extending between tops of 2 mucosal folds)                            esophagitis with no bleeding was found 38 cm from                            the incisors.                           Mucosal changes including feline appearance and                            longitudinal furrows were found in the entire                            esophagus. Biopsies were obtained from the proximal                            and distal esophagus with cold forceps for                            histology of suspected eosinophilic esophagitis.                            Biopsies from the proximal esophagus with positive                            "  tug sign". Estimated blood loss was minimal.                           The entire examined stomach was normal. Biopsies                            were taken with a cold forceps for Helicobacter                            pylori testing. Estimated blood loss was minimal.                           The duodenal bulb, first portion of the duodenum                            and second portion of the duodenum were normal.                            Biopsies for histology were taken with a cold                            forceps for evaluation of celiac disease. Estimated                            blood loss was minimal. Complications:            No immediate complications. Estimated Blood Loss:     Estimated blood loss was minimal. Impression:               - LA Grade A reflux esophagitis.                           - Esophageal mucosal changes suspicious for                             eosinophilic esophagitis. Biopsied.                           - Normal stomach. Biopsied.                           - Normal duodenal bulb, first portion of the                            duodenum and second portion of the duodenum.                            Biopsied. Recommendation:           - Patient has a contact number available for                            emergencies. The signs and symptoms of potential                            delayed complications were discussed with  the                            patient. Return to normal activities tomorrow.                            Written discharge instructions were provided to the                            patient.                           - Resume previous diet today.                           - Continue present medications.                           - Await pathology results.                           - Use Protonix (pantoprazole) 20 mg PO BID for 8                            weeks.                           - Return to GI clinic in 6 weeks. Gerrit Heck, MD 02/06/2018 3:35:51 PM

## 2018-02-06 NOTE — Op Note (Signed)
[facility] Patient Name: [name] [name] Procedure Date: 02/06/2018 2:54 PM MRN: [id number] Endoscopist: Gerrit Heck , MD Age: 32 Referring MD:  Date of Birth: 15-Apr-1985 Gender: Female Account #: [id number] Procedure:                Colonoscopy Indications:              Epigastric abdominal pain, Abdominal pain in the                            right upper quadrant, Change in bowel habits,                            History of Fistula in ano with prior seton                            placement, LIFT procedure x2, then fistulotomy                            earlier this year. No previous EGD or colonoscopy. Medicines:                Monitored Anesthesia Care Procedure:                Pre-Anesthesia Assessment:                           - Prior to the procedure, a History and Physical                            was performed, and patient medications and                            allergies were reviewed. The patient's tolerance of                            previous anesthesia was also reviewed. The risks                            and benefits of the procedure and the sedation                            options and risks were discussed with the patient.                            All questions were answered, and informed consent                            was obtained. Prior Anticoagulants: The patient has                            taken no previous anticoagulant or antiplatelet                            agents. ASA Grade Assessment: II - A patient with  mild systemic disease. After reviewing the risks                            and benefits, the patient was deemed in                            satisfactory condition to undergo the procedure.                           After obtaining informed consent, the colonoscope                            was passed under direct vision. Throughout the                            procedure, the patient's  blood pressure, pulse, and                            oxygen saturations were monitored continuously. The                            Model CF-HQ190L [id number]) scope was introduced                            through the anus and advanced to the the terminal                            ileum. The colonoscopy was performed without                            difficulty. The patient tolerated the procedure                            well. The quality of the bowel preparation was                            adequate. Scope In: 3:15:02 PM Scope Out: 3:28:52 PM Scope Withdrawal Time: 0 hours 12 minutes 15 seconds  Total Procedure Duration: 0 hours 13 minutes 50 seconds  Findings:                 Perianal exam notable for well healed scar from                            prior surgical intervention. Otherwise, perianal                            and digital rectal examinations were normal.                           A 3 mm polyp was found in the transverse colon. The                            polyp was sessile. The polyp was removed with a  cold snare. Resection and retrieval were complete.                            Estimated blood loss was minimal.                           A 5 mm polyp was found in the rectum. The polyp was                            sessile. The polyp was removed with a cold snare.                            Resection and retrieval were complete. Estimated                            blood loss was minimal.                           Normal mucosa was found in the proximal transverse                            colon, in the ascending colon and in the cecum.                            Biopsies for histology were taken with a cold                            forceps from the right colon for evaluation of                            microscopic colitis. Estimated blood loss was                            minimal.                           The mucosa  vascular pattern in the rectum, in the                            sigmoid colon, in the descending colon and in the                            distal transverse colon was diffusely decreased.                            This was biopsied with a cold forceps for                            histology. Estimated blood loss was minimal.                           The terminal ileum appeared normal.  Retroflexion in the rectum was not performed due to                            anatomy. The anal verge was otherwise normal                            appearing on anterograde views and anoscopy. Complications:            No immediate complications. Estimated Blood Loss:     Estimated blood loss was minimal. Impression:               - One 3 mm polyp in the transverse colon, removed                            with a cold snare. Resected and retrieved.                           - One 5 mm polyp in the rectum, removed with a cold                            snare. Resected and retrieved.                           - Normal mucosa in the proximal transverse colon,                            in the ascending colon and in the cecum. Biopsied.                           - Decreased mucosa vascular pattern in the rectum,                            in the sigmoid colon, in the descending colon and                            in the distal transverse colon. Biopsied.                           - The examined portion of the ileum was normal. Recommendation:           - Patient has a contact number available for                            emergencies. The signs and symptoms of potential                            delayed complications were discussed with the                            patient. Return to normal activities tomorrow.                            Written discharge instructions were provided to the  patient.                           - Resume previous diet  today.                           - Continue present medications.                           - Await pathology results.                           - Return to GI clinic in 4-6 weeks. Gerrit Heck, MD 02/06/2018 3:45:48 PM

## 2018-02-06 NOTE — Progress Notes (Signed)
A/ox3 pleased with MAC, report to RN 

## 2018-02-07 ENCOUNTER — Telehealth: Payer: Self-pay

## 2018-02-07 ENCOUNTER — Telehealth: Payer: Self-pay | Admitting: *Deleted

## 2018-02-07 NOTE — Telephone Encounter (Signed)
Called patient back per Dr. Bryan Lemma. Pt still is having 5/10 mid epigastric discomfort. She hasnt tried the simethicone or the Gas X yet. Encouraged her to try this but to let us know if the pain worsens and if so for her to go to the ED after hours. Sm

## 2018-02-07 NOTE — Telephone Encounter (Signed)
  Follow up Call-  Call back number 02/06/2018  Post procedure Call Back phone  # [phone]  Permission to leave phone message Yes  Some recent data might be hidden     Patient questions:  Do you have a fever, pain , or abdominal swelling? Yes.   Pain Score  5 *  Have you tolerated food without any problems? Yes.    Have you been able to return to your normal activities? Yes.    Do you have any questions about your discharge instructions: Diet   No. Medications  No. Follow up visit  No.  Do you have questions or concerns about your Care? Yes.  pt is having 5/10 pain mid epigastric pain she thinks it may be "gas". No bleeding or SOB. Doesn't feel worse when she takes a deep breath.   Actions: * If pain score is 4 or above: Physician/ provider Notified : Vito Cirigliano, DO.

## 2018-02-07 NOTE — Telephone Encounter (Signed)
Spoke with pt-pt advised of MD recommendations for follow up appt post upper gi endo/colon; pt scheduled for 03/07/18-arrival at 8:15 am for appt at 8:30 am at the [facility] office; pt advised to call back if questions/concerns arise; pt verbalized understanding of instructions;

## 2018-02-07 NOTE — Telephone Encounter (Signed)
Agree, she can certainly have some abdominal pain secondary to gas/air trapping. No dilation or large polypectomy performed. Can trial some OTC simethicone, Gas-X, etc. Agree that if worsening pain or if fever or other concerns, to call back or head to ER if after hours. Thank you.

## 2018-02-22 ENCOUNTER — Ambulatory Visit: Payer: 59 | Admitting: Gastroenterology

## 2018-02-22 ENCOUNTER — Encounter: Payer: Self-pay | Admitting: Gastroenterology

## 2018-02-22 VITALS — BP 122/74 | HR 88 | Ht 62.0 in | Wt 229.0 lb

## 2018-02-22 DIAGNOSIS — K580 Irritable bowel syndrome with diarrhea: Secondary | ICD-10-CM

## 2018-02-22 DIAGNOSIS — R197 Diarrhea, unspecified: Secondary | ICD-10-CM | POA: Diagnosis not present

## 2018-02-22 DIAGNOSIS — R1011 Right upper quadrant pain: Secondary | ICD-10-CM

## 2018-02-22 DIAGNOSIS — K219 Gastro-esophageal reflux disease without esophagitis: Secondary | ICD-10-CM | POA: Diagnosis not present

## 2018-02-22 DIAGNOSIS — K760 Fatty (change of) liver, not elsewhere classified: Secondary | ICD-10-CM

## 2018-02-22 MED ORDER — DICYCLOMINE HCL 10 MG PO CAPS: 10.0000 mg | ORAL_CAPSULE | Freq: Four times a day (QID) | ORAL | 1 refills | Status: DC | PRN

## 2018-02-22 MED ORDER — MESALAMINE ER 0.375 G PO CP24: 1.5000 g | ORAL_CAPSULE | Freq: Every day | ORAL | 1 refills | Status: DC

## 2018-02-22 NOTE — Patient Instructions (Addendum)
If you are age 33 or older, your body mass index should be between 23-30. Your Body mass index is 41.88 kg/m. If this is out of the aforementioned range listed, please consider follow up with your Primary Care Provider.  If you are age 98 or younger, your body mass index should be between 19-25. Your Body mass index is 41.88 kg/m. If this is out of the aformentioned range listed, please consider follow up with your Primary Care Provider.   We have sent the following medications to your pharmacy for you to pick up at your convenience: Apriso 1.5gm(4 capsules)  daily Bentyl 10mg  prn every 6 hours as needed.  Decrease Protonix to once daily.  You have been give a sample of  Apriso (3 boxes/12 capsules)  You have been scheduled for a HIDA scan at [facility]iology (1st floor) on 03/13/2018. Please arrive 15 minutes prior to your scheduled appointment at  5:95GL. Make certain not to have anything to eat or drink at least 6 hours prior to your test. Hold any stomach medication and pain medication until after your scan. Should this appointment date or time not work well for you, please call radiology scheduling at [phone].  _____________________________________________________________________ hepatobiliary (HIDA) scan is an imaging procedure used to diagnose problems in the liver, gallbladder and bile ducts. In the HIDA scan, a radioactive chemical or tracer is injected into a vein in your arm. The tracer is handled by the liver like bile. Bile is a fluid produced and excreted by your liver that helps your digestive system break down fats in the foods you eat. Bile is stored in your gallbladder and the gallbladder releases the bile when you eat a meal. A special nuclear medicine scanner (gamma camera) tracks the flow of the tracer from your liver into your gallbladder and small intestine.  During your HIDA scan  You'll be asked to change into a hospital gown before your HIDA scan begins. Your  health care team will position you on a table, usually on your back. The radioactive tracer is then injected into a vein in your arm.The tracer travels through your bloodstream to your liver, where it's taken up by the bile-producing cells. The radioactive tracer travels with the bile from your liver into your gallbladder and through your bile ducts to your small intestine.You may feel some pressure while the radioactive tracer is injected into your vein. As you lie on the table, a special gamma camera is positioned over your abdomen taking pictures of the tracer as it moves through your body. The gamma camera takes pictures continually for about an hour. You'll need to keep still during the HIDA scan. This can become uncomfortable, but you may find that you can lessen the discomfort by taking deep breaths and thinking about other things. Tell your health care team if you're uncomfortable. The radiologist will watch on a computer the progress of the radioactive tracer through your body. The HIDA scan may be stopped when the radioactive tracer is seen in the gallbladder and enters your small intestine. This typically takes about an hour. In some cases extra imaging will be performed if original images aren't satisfactory, if morphine is given to help visualize the gallbladder or if the medication CCK is given to look at the contraction of the gallbladder. This test typically takes 2 hours to complete. ________________________________________________________________________   I have placed an order for you to have lab work done 1 month after you start taking your Apriso.  Please call our office at [phone] to set up your 2-3 month follow up visit.  It was a pleasure to see you today!  [name] [name], D.O.

## 2018-02-22 NOTE — Progress Notes (Signed)
P  Chief Complaint:    Abdominal pain, alternating bowel habits  GI History: 32 year old female initially seen by me on 01/21/2018 for evaluation of abdominal pain alternating bowel habits.  She does have a longer standing history of GI symptoms, attributed to IBS, but with superimposed RUQ/MEG stabbing pain.  Was evaluated with EGD/colonoscopy as outlined below.  1) RUQ/MEG pain: Started 12/2017. pain radiates around her right side into her right shoulder blade.  Symptoms last throughout the day with positive nocturnal symptoms, and not related to p.o. intake.  Trialed course of Gas-X at last appointment, without clinical improvement.  She presented to the ER with these symptoms on 12/28/2017 and evaluation was largely unrevealing to include CBC (mild leukocytosis at 11.1; normal on outpatient repeat), CMP (K+ 3.3, normal on outpatient repeat), lipase.  RUQ ultrasound only notable for fatty liver infiltration but otherwise normal GB and no duct dilatation.  She was discharged with Prilosec and Zofran without appreciable clinical improvement. Followed up with her PCM on 01/16/2018 for ongoing symptoms.  Prescribed Elavil 25 mg and referral to GI. No change with Elavil but feels groggy/foggy.  2) Alternating bowel habits: Long-standing history of b/l LQ cramping, spasm type pain, with associated bloating and alternating diarrhea/constipation.  No associated hematochezia or melena.  Admittedly, with a history of stress, depression, anxiety which seems to affect the symptoms.  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 Leighton Ruff.  - 74/0814: "Abscess" starts - 12/2015: First surgery - 12/2016: LIFT surgery - 03/2017: LIFT surgery #2 - 06/2017: Fistulotomy Minor incontinence of stool since last  fistulotomy.  4) GERD with erosive esophagitis: Reflux symptoms well controlled on twice daily PPI.  HPI:     Patient is a 32 y.o. female presenting to the Gastroenterology Clinic for follow-up. No change with trial of Gas-X.  Overall, no change in clinical symptoms since last appointment.  Completed EGD/colonoscopy as outlined below.  Endoscopic history: - EGD (02/06/2018, Dr. Bryan Lemma): LA grade a esophagitis, felinization longitudinal furrows in the esophagus (biopsy: 14 eos and distal/6 eos and proximal esophagus), normal stomach (biopsy non-H. pylori gastritis), normal duodenum (biopsies without celiac) -Colonoscopy (02/06/2018, Dr. Bryan Lemma): Perianal exam notable for well-healed scar from prior surgical intervention, 3 mm hyperplastic polyp in transverse colon, 5 mm inflammatory polyp in rectum, normal mucosa in the cecum through proximal transverse colon, but reduced vascular pattern in the distal transverse colon through rectum.  Biopsies were otherwise normal, without inflammatory changes or Microscopic Colitis, normal terminal ileum  Review of systems:     No chest pain, no SOB, no fevers, no urinary sx   Past Medical History:  Diagnosis Date  . Anxiety   . Chronic headaches   . Depression   . Fistula-in-ano   . GERD (gastroesophageal reflux disease)    occ  . 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 Migraine   . Numbness and tingling of left leg    cold sensation, painful    Patient's surgical history, family medical history, social history, medications and allergies were all reviewed in Epic    Current Outpatient Medications  Medication Sig Dispense Refill  . acetaminophen (TYLENOL) 500 MG tablet Take 500 mg by mouth every 6 (six) hours as needed for mild pain.    Marland Kitchen ibuprofen (ADVIL,MOTRIN) 200 MG  tablet Take 200 mg by mouth every 6 (six) hours as needed for moderate pain.     . medroxyPROGESTERone (DEPO-PROVERA) 150 MG/ML injection Inject 1 mL (150 mg total)  into the muscle every 3 (three) months. 1 mL 0  . naproxen (NAPROSYN) 250 MG tablet Take 250 mg by mouth as needed.    . ondansetron (ZOFRAN) 4 MG tablet Take 1 tablet (4 mg total) by mouth every 6 (six) hours as needed for nausea or vomiting. 30 tablet 1  . ondansetron (ZOFRAN-ODT) 8 MG disintegrating tablet Take 8 mg by mouth every 8 (eight) hours as needed for nausea or vomiting.    . pantoprazole (PROTONIX) 20 MG tablet Take Protonix 20 mg po BID for 8 weeks 112 tablet 1  . valACYclovir (VALTREX) 1000 MG tablet Take 1,000 mg by mouth daily.     No current facility-administered medications for this visit.     Physical Exam:     BP 122/74   Pulse 88   Ht 5\' 2"  (1.575 m)   Wt 229 lb (103.9 kg)   BMI 41.88 kg/m   GENERAL:  Pleasant female in NAD PSYCH: : Cooperative, normal affect EENT:  conjunctiva pink, mucous membranes moist, neck supple without masses ABDOMEN:  Nondistended, soft, nontender. No obvious masses, no hepatomegaly,  normal bowel sounds SKIN:  turgor, no lesions seen Musculoskeletal:  Normal muscle tone, normal strength NEURO: Alert and oriented x 3, no focal neurologic deficits   IMPRESSION and PLAN:    #1.  MEG/RUQ pain: Difficult to parse out whether the symptoms are truly independent of her long-standing GI issues (IBS).  However, certainly seems to be more bothersome to her and potentially with a separate biliary component to it.  Otherwise EGD was unrevealing for UGI pathology to explain the symptoms.  Will evaluate as below: - HIDA scan - Okay to decrease Protonix to daily - Can consider trial of FD guard for nonulcer dyspepsia.  Will hold off on that today - Trial course of Bentyl  #2.  IBS: Long-standing GI issues secondary to IBS-diarrhea predominant.  Symptoms do seem to be related to times of heightened stress/anxiety per patient's prior report.  Did not tolerate trial of Elavil.  However, the colonoscopy did demonstrate some evidence of hyperemia  (decreased vascular pattern in the transverse through the left colon).  Surprised that the histology did not demonstrate any inflammatory changes.  Discussed this finding in detail and will trial a course of a presumed for diagnostic and therapeutic intent.  - Start Apriso 1.5 g daily. - Check chemistry panel in 1 month, then 12 weeks, in 6 months after starting Apriso - Trial course of Bentyl.  If working but requiring regular use, can trial neuromodulation again but with different agent/class - Low FODMAP diet  #3.  GERD with erosive esophagitis: Reflux symptoms well controlled on twice daily PPI.  -Okay to decrease to daily PPI at this point -Resume antireflux lifestyle measures  #4.  History of perianal fistula: Had a high preprocedure index of suspicion for IBD prior to recent EGD/colonoscopy.  While colonoscopy did demonstrate hyperemia, biopsies were without chronic inflammatory changes.  Otherwise no evidence of ongoing fistula on that exam.  Will trial course of 5-ASA as above.  Did discuss referral back to colorectal surgery, which she politely declined at this time.  #5. NAFLD: Fatty infiltration incidentally noted on recent RUQ ultrasound.  Liver enzymes otherwise normal.  -Repeat liver enzymes in 6 to 12 months  RTC in  3 months or sooner as needed  I spent a total of 25 minutes of face-to-face time with the patient. Greater than 50% of the time was spent counseling and coordinating care.      Lavena Bullion ,DO, FACG 02/22/2018, 2:16 PM

## 2018-02-25 ENCOUNTER — Other Ambulatory Visit: Payer: Self-pay

## 2018-02-25 ENCOUNTER — Encounter: Payer: Self-pay | Admitting: *Deleted

## 2018-02-25 ENCOUNTER — Ambulatory Visit (INDEPENDENT_AMBULATORY_CARE_PROVIDER_SITE_OTHER): Payer: 59 | Admitting: Obstetrics

## 2018-02-25 ENCOUNTER — Encounter: Payer: Self-pay | Admitting: Obstetrics

## 2018-02-25 VITALS — BP 113/74 | HR 91 | Ht 62.0 in | Wt 227.9 lb

## 2018-02-25 DIAGNOSIS — Z113 Encounter for screening for infections with a predominantly sexual mode of transmission: Secondary | ICD-10-CM

## 2018-02-25 DIAGNOSIS — N760 Acute vaginitis: Secondary | ICD-10-CM

## 2018-02-25 DIAGNOSIS — Z3042 Encounter for surveillance of injectable contraceptive: Secondary | ICD-10-CM

## 2018-02-25 DIAGNOSIS — N898 Other specified noninflammatory disorders of vagina: Secondary | ICD-10-CM

## 2018-02-25 DIAGNOSIS — Z1151 Encounter for screening for human papillomavirus (HPV): Secondary | ICD-10-CM | POA: Diagnosis not present

## 2018-02-25 DIAGNOSIS — Z01419 Encounter for gynecological examination (general) (routine) without abnormal findings: Secondary | ICD-10-CM | POA: Diagnosis not present

## 2018-02-25 DIAGNOSIS — Z124 Encounter for screening for malignant neoplasm of cervix: Secondary | ICD-10-CM | POA: Diagnosis not present

## 2018-02-25 DIAGNOSIS — Z6841 Body Mass Index (BMI) 40.0 and over, adult: Secondary | ICD-10-CM

## 2018-02-25 MED ORDER — MEDROXYPROGESTERONE ACETATE 150 MG/ML IM SUSP
150.0000 mg | Freq: Once | INTRAMUSCULAR | Status: AC
  Administered 2018-02-25: 150 mg via INTRAMUSCULAR

## 2018-02-25 MED ORDER — METRONIDAZOLE 500 MG PO TABS: 500.0000 mg | ORAL_TABLET | Freq: Two times a day (BID) | ORAL | 0 refills | Status: DC

## 2018-02-25 MED ORDER — DOXYCYCLINE HYCLATE 100 MG PO CAPS: 100.0000 mg | ORAL_CAPSULE | Freq: Two times a day (BID) | ORAL | 0 refills | Status: DC

## 2018-02-25 MED ORDER — MEDROXYPROGESTERONE ACETATE 150 MG/ML IM SUSP: 150.0000 mg | INTRAMUSCULAR | 3 refills | Status: DC

## 2018-02-25 MED ORDER — LIDOCAINE 5 % EX OINT: 1.0000 "application " | TOPICAL_OINTMENT | CUTANEOUS | 2 refills | Status: DC | PRN

## 2018-02-25 NOTE — Progress Notes (Signed)
Subjective:        [name] [name] is a 32 y.o. female here for a routine exam.  Current complaints: Vaginal pain, mainly inner labial area..    Personal health questionnaire:  Is patient [name], have a family history of breast and/or ovarian cancer: no Is there a family history of uterine cancer diagnosed at age < 34, gastrointestinal cancer, urinary tract cancer, family member who is a [name]: no Is the patient overweight and hypertensive, family history of diabetes, personal history of gestational diabetes, preeclampsia or PCOS: no Is patient over 56, have PCOS,  family history of premature CHD under age 45, diabetes, smoke, have hypertension or peripheral artery disease:  no At any time, has a partner hit, kicked or otherwise hurt or frightened you?: no Over the past 2 weeks, have you felt down, depressed or hopeless?: no Over the past 2 weeks, have you felt little interest or pleasure in doing things?:no   Gynecologic History No LMP recorded. Patient has had an injection. Contraception: Depo-Provera injections Last Pap: 2018. Results were: normal Last mammogram: n/a. Results were: n/a  Obstetric History OB History  Gravida Para Term Preterm AB Living  1 1 1     1   SAB TAB Ectopic Multiple Live Births          1    # Outcome Date GA Lbr Len/2nd Weight Sex Delivery Anes PTL Lv  1 Term 08/16/13 [redacted]w[redacted]d 23:40 / 00:38 6 lb 14 oz (3.118 kg) M Vag-Spont EPI  LIV    Past Medical History:  Diagnosis Date  . Anxiety   . Chronic headaches   . Depression   . Fistula-in-ano   . GERD (gastroesophageal reflux disease)    occ  . 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 Migraine   . Numbness and tingling of left leg    cold sensation, painful    Past Surgical History:  Procedure Laterality Date  . FISTULOTOMY N/A 06/28/2017   Procedure: FISTULOTOMY;  Surgeon: Leighton Ruff, MD;  Location: [facility];  Service: General;   Laterality: N/A;  . INCISION AND DRAINAGE ABSCESS ANAL     two separate times in ED  . INCISION AND DRAINAGE PERIRECTAL ABSCESS Left 12/21/2016   Procedure: PLACEMENT OF SETON INTO FISTULA  AND INCISION AND DRAINAGE OF PERIRECTAL ABCESS;  Surgeon: Kieth Brightly [name], MD;  Location: [facility];  Service: General;  Laterality: Left;  . IRRIGATION AND DEBRIDEMENT BUTTOCKS Left 12/28/2015   Procedure: IRRIGATION AND DEBRIDEMENT BUTTOCKS ABSCESS CHRONIC;  Surgeon: Erroll Luna, MD;  Location: [facility];  Service: General;  Laterality: Left;  . LIGATION OF INTERNAL FISTULA TRACT N/A 03/08/2017   Procedure: LIGATION OF INTERNAL INTERSPHINCTERIC FISTULA TRACT;  Surgeon: Kieth Brightly, [name], MD;  Location: [facility];  Service: General;  Laterality: N/A;     Current Outpatient Medications:  .  acetaminophen (TYLENOL) 500 MG tablet, Take 500 mg by mouth every 6 (six) hours as needed for mild pain., Disp: , Rfl:  .  dicyclomine (BENTYL) 10 MG capsule, Take 1 capsule (10 mg total) by mouth every 6 (six) hours as needed for spasms., Disp: 120 capsule, Rfl: 1 .  doxycycline (VIBRAMYCIN) 100 MG capsule, Take 1 capsule (100 mg total) by mouth 2 (two) times daily., Disp: 28 capsule, Rfl: 0 .  ibuprofen (ADVIL,MOTRIN) 200 MG tablet, Take 200 mg by mouth every 6 (six) hours as needed for moderate pain. ,  Disp: , Rfl:  .  lidocaine (XYLOCAINE) 5 % ointment, Apply 1 application topically as needed., Disp: 60 g, Rfl: 2 .  medroxyPROGESTERone (DEPO-PROVERA) 150 MG/ML injection, Inject 1 mL (150 mg total) into the muscle every 3 (three) months., Disp: 1 mL, Rfl: 0 .  medroxyPROGESTERone (DEPO-PROVERA) 150 MG/ML injection, Inject 1 mL (150 mg total) into the muscle every 3 (three) months., Disp: 1 mL, Rfl: 3 .  mesalamine (APRISO) 0.375 g 24 hr capsule, Take 4 capsules (1.5 g total) by mouth daily., Disp: 120 capsule, Rfl: 1 .  metroNIDAZOLE (FLAGYL) 500 MG tablet, Take 1 tablet (500 mg  total) by mouth 2 (two) times daily., Disp: 14 tablet, Rfl: 0 .  naproxen (NAPROSYN) 250 MG tablet, Take 250 mg by mouth as needed., Disp: , Rfl:  .  ondansetron (ZOFRAN) 4 MG tablet, Take 1 tablet (4 mg total) by mouth every 6 (six) hours as needed for nausea or vomiting., Disp: 30 tablet, Rfl: 1 .  ondansetron (ZOFRAN-ODT) 8 MG disintegrating tablet, Take 8 mg by mouth every 8 (eight) hours as needed for nausea or vomiting., Disp: , Rfl:  .  pantoprazole (PROTONIX) 20 MG tablet, Take Protonix 20 mg po BID for 8 weeks, Disp: 112 tablet, Rfl: 1 .  valACYclovir (VALTREX) 1000 MG tablet, Take 1,000 mg by mouth daily., Disp: , Rfl:  Allergies  Allergen Reactions  . Gabapentin     hallunciations  . Cinnamon Other (See Comments)    Blisters on mouth and lips, coughing.    Marland Kitchen Hydrocodone Diarrhea    Social History   Tobacco Use  . Smoking status: Never Smoker  . Smokeless tobacco: Never Used  Substance Use Topics  . Alcohol use: Not Currently    Family History  Problem Relation Age of Onset  . Alcoholism Father   . Epilepsy Father   . Heart disease Father   . Breast cancer Maternal Aunt   . Colon cancer Neg Hx   . Esophageal cancer Neg Hx       Review of Systems  Constitutional: negative for fatigue and weight loss Respiratory: negative for cough and wheezing Cardiovascular: negative for chest pain, fatigue and palpitations Gastrointestinal: negative for abdominal pain and change in bowel habits Musculoskeletal:negative for myalgias Neurological: negative for gait problems and tremors Behavioral/Psych: negative for abusive relationship, depression Endocrine: negative for temperature intolerance    Genitourinary:positive for vaginal pain Integument/breast: negative for breast lump, breast tenderness, nipple discharge and skin lesion(s)    Objective:       BP 113/74   Pulse 91   Ht 5\' 2"  (1.575 m)   Wt 227 lb 14.4 oz (103.4 kg)   BMI 41.68 kg/m  General:   alert  Skin:    no rash or abnormalities  Lungs:   clear to auscultation bilaterally  Heart:   regular rate and rhythm, S1, S2 normal, no murmur, click, rub or gallop  Breasts:   normal without suspicious masses, skin or nipple changes or axillary nodes  Abdomen:  normal findings: no organomegaly, soft, non-tender and no hernia  Pelvis:  External genitalia: normal general appearance Urinary system: urethral meatus normal and bladder without fullness, nontender Vaginal: normal without tenderness, induration or masses Cervix: normal appearance Adnexa: normal bimanual exam Uterus: anteverted and non-tender, normal size   Lab Review Urine pregnancy test Labs reviewed yes Radiologic studies reviewed no  50% of 20 min visit spent on counseling and coordination of care.   Assessment:  1. Encounter for routine gynecological examination with Papanicolaou smear of cervix Rx: - Cytology - PAP( [place])  2. Vaginal discharge Rx: - Cervicovaginal ancillary only( [place])  3. Surveillance for Depo-Provera contraception Rx: - medroxyPROGESTERone (DEPO-PROVERA) injection 150 mg - medroxyPROGESTERone (DEPO-PROVERA) 150 MG/ML injection; Inject 1 mL (150 mg total) into the muscle every 3 (three) months.  Dispense: 1 mL; Refill: 3  4. Labial infection Rx: - doxycycline (VIBRAMYCIN) 100 MG capsule; Take 1 capsule (100 mg total) by mouth 2 (two) times daily.  Dispense: 28 capsule; Refill: 0 - metroNIDAZOLE (FLAGYL) 500 MG tablet; Take 1 tablet (500 mg total) by mouth 2 (two) times daily.  Dispense: 14 tablet; Refill: 0 - lidocaine (XYLOCAINE) 5 % ointment; Apply 1 application topically as needed.  Dispense: 60 g; Refill: 2  5. Class 3 severe obesity due to excess calories with serious comorbidity and body mass index (BMI) of 40.0 to 44.9 in adult [facility]) - program of caloric reduction. Exercise and behavioral modification recommended    Plan:    Education reviewed: calcium supplements, depression  evaluation, low fat, low cholesterol diet, safe sex/STD prevention, self breast exams and weight bearing exercise. Contraception: Depo-Provera injections. Follow up in: 4 weeks.   Meds ordered this encounter  Medications  . medroxyPROGESTERone (DEPO-PROVERA) injection 150 mg  . medroxyPROGESTERone (DEPO-PROVERA) 150 MG/ML injection    Sig: Inject 1 mL (150 mg total) into the muscle every 3 (three) months.    Dispense:  1 mL    Refill:  3  . doxycycline (VIBRAMYCIN) 100 MG capsule    Sig: Take 1 capsule (100 mg total) by mouth 2 (two) times daily.    Dispense:  28 capsule    Refill:  0  . metroNIDAZOLE (FLAGYL) 500 MG tablet    Sig: Take 1 tablet (500 mg total) by mouth 2 (two) times daily.    Dispense:  14 tablet    Refill:  0  . lidocaine (XYLOCAINE) 5 % ointment    Sig: Apply 1 application topically as needed.    Dispense:  60 g    Refill:  2   No orders of the defined types were placed in this encounter.   Shelly Bombard MD 02-25-2018

## 2018-02-25 NOTE — Progress Notes (Signed)
Presents for AEX/PAP/STD.  DEPO given in LD, tolerated well.  Next DEPO  Feb. 10-24/2020  Needs DEPO Refills.  Administrations This Visit    medroxyPROGESTERone (DEPO-PROVERA) injection 150 mg    Admin Date 02/25/2018 Action Given Dose 150 mg Route Intramuscular Administered By Tamela Oddi, RMA

## 2018-02-26 ENCOUNTER — Other Ambulatory Visit: Payer: Self-pay | Admitting: Obstetrics

## 2018-02-27 ENCOUNTER — Other Ambulatory Visit: Payer: Self-pay | Admitting: Obstetrics

## 2018-02-27 ENCOUNTER — Ambulatory Visit: Payer: 59 | Admitting: Family Medicine

## 2018-02-27 DIAGNOSIS — B373 Candidiasis of vulva and vagina: Secondary | ICD-10-CM

## 2018-02-27 DIAGNOSIS — B3731 Acute candidiasis of vulva and vagina: Secondary | ICD-10-CM

## 2018-02-27 LAB — CERVICOVAGINAL ANCILLARY ONLY
Bacterial vaginitis: NEGATIVE
Candida vaginitis: POSITIVE — AB
Chlamydia: NEGATIVE
NEISSERIA GONORRHEA: NEGATIVE
TRICH (WINDOWPATH): NEGATIVE

## 2018-02-27 LAB — CYTOLOGY - PAP
DIAGNOSIS: NEGATIVE
HPV (WINDOPATH): NOT DETECTED

## 2018-02-27 MED ORDER — FLUCONAZOLE 150 MG PO TABS: 150.0000 mg | ORAL_TABLET | Freq: Once | ORAL | 0 refills | Status: AC

## 2018-03-07 ENCOUNTER — Ambulatory Visit: Payer: 59 | Admitting: Gastroenterology

## 2018-03-13 ENCOUNTER — Ambulatory Visit (HOSPITAL_COMMUNITY): Payer: 59

## 2018-03-25 ENCOUNTER — Encounter: Payer: Self-pay | Admitting: Obstetrics

## 2018-03-25 ENCOUNTER — Ambulatory Visit: Payer: 59 | Admitting: Obstetrics

## 2018-03-25 VITALS — BP 131/96 | HR 87 | Wt 226.0 lb

## 2018-03-25 DIAGNOSIS — N9411 Superficial (introital) dyspareunia: Secondary | ICD-10-CM

## 2018-03-25 NOTE — Progress Notes (Signed)
RGYN patient presents for F/U visit today from 02/25/18 visit.   CC: pt states she is still having pain in vaginal area at (the entrance) per pt.

## 2018-03-26 ENCOUNTER — Encounter: Payer: Self-pay | Admitting: Obstetrics

## 2018-03-26 NOTE — Progress Notes (Signed)
Patient ID: [name] [name], female   DOB: Nov 09, 1985, 32 y.o.   MRN: [id number]  Chief Complaint  Patient presents with  . Follow-up    HPI [name] [name] is a 32 y.o. female.  History of pain at the vaginal introitus at 6 o'clock, only with penetration or touch.  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  HPI  Past Medical History:  Diagnosis Date  . Anxiety   . Chronic headaches   . Depression   . Fistula-in-ano   . GERD (gastroesophageal reflux disease)    occ  . 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 Migraine   . Numbness and tingling of left leg    cold sensation, painful    Past Surgical History:  Procedure Laterality Date  . FISTULOTOMY N/A 06/28/2017   Procedure: FISTULOTOMY;  Surgeon: Leighton Ruff, MD;  Location: [facility];  Service: General;  Laterality: N/A;  . INCISION AND DRAINAGE ABSCESS ANAL     two separate times in ED  . INCISION AND DRAINAGE PERIRECTAL ABSCESS Left 12/21/2016   Procedure: PLACEMENT OF SETON INTO FISTULA  AND INCISION AND DRAINAGE OF PERIRECTAL ABCESS;  Surgeon: Kieth Brightly [name], MD;  Location: [facility];  Service: General;  Laterality: Left;  . IRRIGATION AND DEBRIDEMENT BUTTOCKS Left 12/28/2015   Procedure: IRRIGATION AND DEBRIDEMENT BUTTOCKS ABSCESS CHRONIC;  Surgeon: Erroll Luna, MD;  Location: [place];  Service: General;  Laterality: Left;  . LIGATION OF INTERNAL FISTULA TRACT N/A 03/08/2017   Procedure: LIGATION OF INTERNAL INTERSPHINCTERIC FISTULA TRACT;  Surgeon: Kieth Brightly, [name], MD;  Location: [place];  Service: General;  Laterality: N/A;    Family History  Problem Relation Age of Onset  . Alcoholism Father   . Epilepsy Father   . Heart disease Father   . Breast cancer Maternal Aunt   . Colon cancer Neg Hx   . Esophageal cancer Neg Hx     Social History Social History   Tobacco Use  . Smoking status: Never Smoker  . Smokeless tobacco: Never  Used  Substance Use Topics  . Alcohol use: Not Currently  . Drug use: No    Allergies  Allergen Reactions  . Gabapentin     hallunciations  . Cinnamon Other (See Comments)    Blisters on mouth and lips, coughing.    Marland Kitchen Hydrocodone Diarrhea    Current Outpatient Medications  Medication Sig Dispense Refill  . acetaminophen (TYLENOL) 500 MG tablet Take 500 mg by mouth every 6 (six) hours as needed for mild pain.    Marland Kitchen dicyclomine (BENTYL) 10 MG capsule Take 1 capsule (10 mg total) by mouth every 6 (six) hours as needed for spasms. 120 capsule 1  . ibuprofen (ADVIL,MOTRIN) 200 MG tablet Take 200 mg by mouth every 6 (six) hours as needed for moderate pain.     Marland Kitchen lidocaine (XYLOCAINE) 5 % ointment Apply 1 application topically as needed. 60 g 2  . medroxyPROGESTERone (DEPO-PROVERA) 150 MG/ML injection Inject 1 mL (150 mg total) into the muscle every 3 (three) months. 1 mL 0  . medroxyPROGESTERone (DEPO-PROVERA) 150 MG/ML injection Inject 1 mL (150 mg total) into the muscle every 3 (three) months. 1 mL 3  . mesalamine (APRISO) 0.375 g 24 hr capsule Take 4 capsules (1.5 g total) by mouth daily. 120 capsule 1  . naproxen (NAPROSYN) 250 MG tablet Take 250 mg by mouth as needed.    . pantoprazole (  PROTONIX) 20 MG tablet Take Protonix 20 mg po BID for 8 weeks 112 tablet 1  . doxycycline (VIBRAMYCIN) 100 MG capsule Take 1 capsule (100 mg total) by mouth 2 (two) times daily. (Patient not taking: Reported on 03/25/2018) 28 capsule 0  . medroxyPROGESTERone Acetate 150 MG/ML SUSY INJECT 1 ML INTO THE MUSCLE EVERY 3 MONTHS 1 Syringe 2  . metroNIDAZOLE (FLAGYL) 500 MG tablet Take 1 tablet (500 mg total) by mouth 2 (two) times daily. (Patient not taking: Reported on 03/25/2018) 14 tablet 0  . ondansetron (ZOFRAN) 4 MG tablet Take 1 tablet (4 mg total) by mouth every 6 (six) hours as needed for nausea or vomiting. (Patient not taking: Reported on 03/25/2018) 30 tablet 1  . ondansetron (ZOFRAN-ODT) 8 MG  disintegrating tablet Take 8 mg by mouth every 8 (eight) hours as needed for nausea or vomiting.    . valACYclovir (VALTREX) 1000 MG tablet Take 1,000 mg by mouth daily.     No current facility-administered medications for this visit.     Review of Systems Review of Systems Constitutional: negative for fatigue and weight loss Respiratory: negative for cough and wheezing Cardiovascular: negative for chest pain, fatigue and palpitations Gastrointestinal: negative for abdominal pain and change in bowel habits Genitourinary:negative Integument/breast: negative for nipple discharge Musculoskeletal:negative for myalgias Neurological: negative for gait problems and tremors Behavioral/Psych: negative for abusive relationship, depression Endocrine: negative for temperature intolerance      Blood pressure (!) 131/96, pulse 87, weight 226 lb (102.5 kg).  Physical Exam Physical Exam General:   alert  Skin:   no rash or abnormalities  Lungs:   clear to auscultation bilaterally  Heart:   regular rate and rhythm, S1, S2 normal, no murmur, click, rub or gallop  Breasts:   normal without suspicious masses, skin or nipple changes or axillary nodes  Abdomen:  normal findings: no organomegaly, soft, non-tender and no hernia  Pelvis:  External genitalia: normal general appearance Urinary system: urethral meatus normal and bladder without fullness, nontender Vaginal: normal without tenderness, induration or masses Cervix: normal appearance Adnexa: normal bimanual exam Uterus: anteverted and non-tender, normal size    50% of 15 min visit spent on counseling and coordination of care.   Data Reviewed Cultures    Assessment     1. Introital dyspareunia     Plan    Follow up in 2 weeks May try Xylocaine injection  No orders of the defined types were placed in this encounter.  No orders of the defined types were placed in this encounter.   Shelly Bombard MD 03-25-2018

## 2018-04-11 ENCOUNTER — Ambulatory Visit (HOSPITAL_COMMUNITY): Payer: 59

## 2018-05-10 ENCOUNTER — Encounter (HOSPITAL_COMMUNITY)
Admission: RE | Admit: 2018-05-10 | Discharge: 2018-05-10 | Disposition: A | Discharge status: Home / Self Care | Payer: 59 | Source: Ambulatory Visit | Attending: Gastroenterology | Admitting: Gastroenterology

## 2018-05-10 DIAGNOSIS — R1011 Right upper quadrant pain: Secondary | ICD-10-CM | POA: Insufficient documentation

## 2018-05-10 MED ORDER — TECHNETIUM TC 99M MEBROFENIN IV KIT
5.2000 | PACK | Freq: Once | INTRAVENOUS | Status: AC | PRN
  Administered 2018-05-10: 5.2 via INTRAVENOUS

## 2018-05-12 ENCOUNTER — Other Ambulatory Visit: Payer: Self-pay | Admitting: Obstetrics

## 2018-05-12 DIAGNOSIS — Z3042 Encounter for surveillance of injectable contraceptive: Secondary | ICD-10-CM

## 2018-05-13 ENCOUNTER — Ambulatory Visit (INDEPENDENT_AMBULATORY_CARE_PROVIDER_SITE_OTHER): Payer: 59

## 2018-05-13 DIAGNOSIS — Z3042 Encounter for surveillance of injectable contraceptive: Secondary | ICD-10-CM | POA: Diagnosis not present

## 2018-05-13 MED ORDER — MEDROXYPROGESTERONE ACETATE 150 MG/ML IM SUSP
150.0000 mg | INTRAMUSCULAR | Status: DC
  Administered 2018-05-13 – 2019-09-30 (×4): 150 mg via INTRAMUSCULAR

## 2018-05-13 NOTE — Progress Notes (Signed)
Patient is in the office for depo, administered in L Del, and pt tolerated well .Marland Kitchen Administrations This Visit    medroxyPROGESTERone (DEPO-PROVERA) injection 150 mg    Admin Date 05/13/2018 Action Given Dose 150 mg Route Intramuscular Administered By Hinton Lovely, RN        Next depo due: Apr 28th- May 12th.

## 2018-05-14 NOTE — Progress Notes (Signed)
I have reviewed the chart and agree with nursing staff's documentation of this patient's encounter.  Mora Bellman, MD 05/14/2018 10:44 AM

## 2018-05-24 ENCOUNTER — Ambulatory Visit: Payer: 59 | Admitting: Gastroenterology

## 2018-05-24 ENCOUNTER — Encounter: Payer: Self-pay | Admitting: Gastroenterology

## 2018-05-24 VITALS — BP 124/72 | HR 102 | Ht 62.0 in | Wt 230.2 lb

## 2018-05-24 DIAGNOSIS — K760 Fatty (change of) liver, not elsewhere classified: Secondary | ICD-10-CM | POA: Diagnosis not present

## 2018-05-24 DIAGNOSIS — K2100 Gastro-esophageal reflux disease with esophagitis, without bleeding: Secondary | ICD-10-CM

## 2018-05-24 DIAGNOSIS — K582 Mixed irritable bowel syndrome: Secondary | ICD-10-CM

## 2018-05-24 DIAGNOSIS — R1011 Right upper quadrant pain: Secondary | ICD-10-CM

## 2018-05-24 DIAGNOSIS — K603 Anal fistula: Secondary | ICD-10-CM

## 2018-05-24 MED ORDER — ESOMEPRAZOLE MAGNESIUM 40 MG PO PACK: 40.0000 mg | PACK | Freq: Every day | ORAL | 12 refills | Status: DC

## 2018-05-24 MED ORDER — VENLAFAXINE HCL 25 MG PO TABS: 25.0000 mg | ORAL_TABLET | Freq: Two times a day (BID) | ORAL | 11 refills | Status: DC

## 2018-05-24 NOTE — Patient Instructions (Addendum)
If you are age 34 or older, your body mass index should be between 23-30. Your Body mass index is 42.11 kg/m. If this is out of the aforementioned range listed, please consider follow up with your Primary Care Provider.  If you are age 73 or younger, your body mass index should be between 19-25. Your Body mass index is 42.11 kg/m. If this is out of the aformentioned range listed, please consider follow up with your Primary Care Provider.   We have sent the following medications to your pharmacy for you to pick up at your convenience: Nexium 40 mg daily. Effexor 25mg  daily at bedtime.  Stop taking your protonix now Stop Apriso  Please purchase the following medications over the counter and take as directed: Glutamine 5g three times daily.  Please call our office at [phone] to set up your 3 month follow up visit.  It was a pleasure to see you today!  Vito Cirigliano, D.O.

## 2018-05-24 NOTE — Progress Notes (Signed)
P  Chief Complaint:    RUQ Pain, medication refill (Protonix)  GI History: 33 year old female initially seen by me on 01/21/2018 for evaluation of abdominal pain alternating bowel habits.  She does have a longer standing history of GI symptoms, attributed to IBS, but with superimposed RUQ/MEG stabbing pain.  Was evaluated with EGD/colonoscopy as outlined below.  1) RUQ/MEG pain: Started 12/2017. pain radiates around her right side into her right shoulder blade.  Symptoms last throughout the day with positive nocturnal symptoms, and not related to p.o. intake.  RUQ ultrasound with fatty liver otherwise normal.  Normal HIDA 05/2018. No improvement with Gas-X or high dose PPI. No response to trial of Elavil.  EGD in 02/2018 otherwise normal to include normal gastric and duodenal biopsies.  2) IBS mixed type: Long-standing history of b/l LQ cramping, spasm type pain, with associated bloating and alternating diarrhea/constipation.  No associated hematochezia or melena.  Admittedly, with a history of stress, depression, anxiety which seems to affect the symptoms.  Colonoscopy with hyperemia in transverse through sigmoid colon, with normal biopsies.  Random biopsies otherwise negative for MC.  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 Leighton Ruff.  - 18/5631: "Abscess" starts - 12/2015: First surgery - 12/2016: LIFT surgery - 03/2017: LIFT surgery #2 - 06/2017: Fistulotomy Minor incontinence of stool since last fistulotomy.  4) GERD with erosive esophagitis: LA grade a esophagitis on EGD in 02/2017.  Reflux symptoms well controlled on twice daily PPI.  HPI:    Patient is a 33 y.o. female presenting [facility] for follow-up. She was last seen by me on 02/22/18 and at that time reported  ongoing RUQ/MEG pain. Was evaluated with HIDA (normal; pain with ingesting Ensure). Trialed course of Apriso for hyperemia noted on colonoscopy (bxs were normal) and Bentyl along with rec for low FODMAP diet. Reduced Protonix to daily. Held off on FD Guard, TCA, SNRI, etc.   No change with Apriso. Bentyl helps with cramping pain, particularly at night.  Allows her sleep through the night.  Does not change the RUQ pain though.Takes approximately 4 times per week.   Still with dyspepsia and reflux sxs. Would liket to trial change in PPI.   Currently, takes no NSAIDs since November.    Endoscopic history: - EGD (02/06/2018, Dr. Bryan Lemma): LA grade a esophagitis, felinization longitudinal furrows in the esophagus (biopsy: 14 eos and distal/6 eos and proximal esophagus), normal stomach (biopsy non-H. pylori gastritis), normal duodenum (biopsies without celiac) -Colonoscopy (02/06/2018, Dr. Bryan Lemma): Perianal exam notable for well-healed scar from prior surgical intervention, 3 mm hyperplastic polyp in transverse colon, 5 mm inflammatory polyp in rectum, normal mucosa in the cecum through proximal transverse colon, but reduced vascular pattern in the distal transverse colon through rectum.  Biopsies were otherwise normal, without inflammatory changes or Microscopic Colitis, normal terminal ileum  Review of systems:     No chest pain, no SOB, no fevers, no urinary sx   Past Medical History:  Diagnosis Date  . Anxiety   . Chronic headaches   . Depression   . Fistula-in-ano   . GERD (gastroesophageal reflux disease)    occ  . 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 Migraine   . Numbness and tingling of left leg    cold sensation, painful  Patient's surgical history, family medical history, social history, medications and allergies were all reviewed in Epic    Current Outpatient Medications  Medication Sig Dispense Refill  . acetaminophen (TYLENOL) 500 MG tablet Take 500 mg by  mouth every 6 (six) hours as needed for mild pain.    Marland Kitchen dicyclomine (BENTYL) 10 MG capsule Take 1 capsule (10 mg total) by mouth every 6 (six) hours as needed for spasms. 120 capsule 1  . ibuprofen (ADVIL,MOTRIN) 200 MG tablet Take 200 mg by mouth every 6 (six) hours as needed for moderate pain.     Marland Kitchen lidocaine (XYLOCAINE) 5 % ointment Apply 1 application topically as needed. 60 g 2  . medroxyPROGESTERone (DEPO-PROVERA) 150 MG/ML injection Inject 1 mL (150 mg total) into the muscle every 3 (three) months. 1 mL 3  . mesalamine (APRISO) 0.375 g 24 hr capsule Take 4 capsules (1.5 g total) by mouth daily. 120 capsule 1  . naproxen (NAPROSYN) 250 MG tablet Take 250 mg by mouth as needed.    . ondansetron (ZOFRAN) 4 MG tablet Take 1 tablet (4 mg total) by mouth every 6 (six) hours as needed for nausea or vomiting. 30 tablet 1  . valACYclovir (VALTREX) 1000 MG tablet Take 1,000 mg by mouth daily.    . pantoprazole (PROTONIX) 20 MG tablet Take Protonix 20 mg po BID for 8 weeks (Patient not taking: Reported on 05/24/2018) 112 tablet 1   Current Facility-Administered Medications  Medication Dose Route Frequency Provider Last Rate Last Dose  . medroxyPROGESTERone (DEPO-PROVERA) injection 150 mg  150 mg Intramuscular Q90 days Shelly Bombard, MD   150 mg at 05/13/18 0845    Physical Exam:     BP 124/72   Pulse (!) 102   Ht 5\' 2"  (1.575 m)   Wt 230 lb 4 oz (104.4 kg)   BMI 42.11 kg/m   GENERAL:  Pleasant female in NAD PSYCH: : Cooperative, normal affect EENT:  conjunctiva pink, mucous membranes moist, neck supple without masses CARDIAC:  RRR, no murmur heard, no peripheral edema PULM: Normal respiratory effort, lungs CTA bilaterally, no wheezing ABDOMEN: Diffuse mild TTP without rebound or guarding.  Nondistended, soft.  No peritoneal signs.  No obvious masses, no hepatomegaly,  normal bowel sounds SKIN:  turgor, no lesions seen Musculoskeletal:  Normal muscle tone, normal strength NEURO: Alert  and oriented x 3, no focal neurologic deficits   IMPRESSION and PLAN:    #1.  MEG/RUQ pain: Negative evaluation to date to include HIDA scan (although interestingly did have pain with Ensure ingestion), RUQ ultrasound, EGD and no effect with multiple medication trials to date.  Lower GI symptoms seem to respond to trial of Bentyl, but not much in the way of improvement with the MEG/RUQ pain.  No appreciable tenderness with palpation over costal margins.  -Will observe for improvement with treatment for additional GI elements as below. -If no improvement, may consider referral to General Surgery for GB evaluation and review of recent HIDA results.    #2.  IBS: Long-standing GI issues secondary to IBS-diarrhea predominant.  Symptoms do seem to be related to times of heightened stress/anxiety per patient's prior report.  Did not tolerate trial of Elavil.  No improvement with trial of Apriso (hyperemia noted on colonoscopy but normal biopsies).  - Resume low FODMAP diet -Given response to trial of Bentyl, as she is requiring this at least 4 times per week, will change to SNRI (did not tolerate previous trial of TCA). -Start  Effexor 25 mg qhs then can increase slowly to 37.g mg daily then increase as needed to control symptoms - Previously discussed the medical benefits of yoga and regular exercise in all IBS patients - Trial course of glutamine 5 g 3 times daily given described benefit and IBS patients -Stop Apriso  - RTC in 3 to 6 months or sooner as needed  #3.  GERD with erosive esophagitis: Reports increasing breakthrough symptoms on current therapy.  -Change Protonix to Nexium 40 mg daily -Resume antireflux lifestyle measures  #4.  History of perianal fistula: No IBD noted on EGD/colonoscopy.  Previously discussed referral back to colorectal surgery, which she politely declined at that time.  #5. NAFLD: Fatty infiltration incidentally noted on recent RUQ ultrasound.  Liver enzymes  otherwise normal.  -Repeat liver enzymes in 6 to 12 months  RTC in 3 months or sooner as needed  I spent a total of 25 minutes of face-to-face time with the patient. Greater than 50% of the time was spent counseling and coordinating care.           Lavena Bullion ,DO, FACG 05/24/2018, 2:44 PM

## 2018-05-27 ENCOUNTER — Other Ambulatory Visit: Payer: Self-pay | Admitting: Gastroenterology

## 2018-05-28 NOTE — Telephone Encounter (Signed)
Pt stated that Mabel requested clarification on whether the Nexium is prescribed as capsule or packet.

## 2018-05-29 ENCOUNTER — Other Ambulatory Visit: Payer: Self-pay

## 2018-05-29 MED ORDER — ESOMEPRAZOLE MAGNESIUM 40 MG PO CPDR: 40.0000 mg | DELAYED_RELEASE_CAPSULE | Freq: Every day | ORAL | 11 refills | Status: DC

## 2018-07-11 ENCOUNTER — Telehealth: Payer: Self-pay

## 2018-07-11 NOTE — Telephone Encounter (Signed)
Last ov 01/2018- overdue for 6 week f/u. Virtual visit?

## 2018-07-11 NOTE — Telephone Encounter (Signed)
CALLED AND SCHEDULED APPT FOR Monday 07/15/2018

## 2018-07-15 ENCOUNTER — Other Ambulatory Visit: Payer: Self-pay

## 2018-07-15 ENCOUNTER — Encounter: Payer: Self-pay | Admitting: Family Medicine

## 2018-07-15 ENCOUNTER — Ambulatory Visit (INDEPENDENT_AMBULATORY_CARE_PROVIDER_SITE_OTHER): Payer: 59 | Admitting: Family Medicine

## 2018-07-15 VITALS — Ht 62.0 in | Wt 222.0 lb

## 2018-07-15 DIAGNOSIS — G8929 Other chronic pain: Secondary | ICD-10-CM

## 2018-07-15 DIAGNOSIS — J302 Other seasonal allergic rhinitis: Secondary | ICD-10-CM | POA: Diagnosis not present

## 2018-07-15 DIAGNOSIS — R109 Unspecified abdominal pain: Secondary | ICD-10-CM | POA: Diagnosis not present

## 2018-07-15 MED ORDER — NORTRIPTYLINE HCL 25 MG PO CAPS: 25.0000 mg | ORAL_CAPSULE | Freq: Every day | ORAL | 3 refills | Status: DC

## 2018-07-15 MED ORDER — ALBUTEROL SULFATE 108 (90 BASE) MCG/ACT IN AEPB: 1.0000 | INHALATION_SPRAY | Freq: Four times a day (QID) | RESPIRATORY_TRACT | 1 refills | Status: DC | PRN

## 2018-07-15 NOTE — Progress Notes (Signed)
Virtual Visit via Video Note  I connected with@ on 07/11/18 at  1:15 PM EDT by a video enabled telemedicine application and verified that I am speaking with the correct person using two identifiers.   I discussed the limitations of evaluation and management by telemedicine and the availability of in person appointments. The patient expressed understanding and agreed to proceed.  History of Present Illness: Hx of chronic abd pain. Seen around 6 mo ago, started ibs tx and placed on TCA. Did not improve, saw GI. Rx'd Effexor, mesalamine, Bentyl, and several PPI trials. Colonoscopy showed some inflammation, but bx's not dx'stic of IBD. Still having pain. Affecting her quality of life.   +hx of allergies to pollen. Gets allergy induced asthma. Usually goes to ER for neb tx's, requesting inhaler to avoid this. No current wheezing or fevers.    Observations/Objective: No conversational dyspnea Age appropriate judgment and insight Nml affect and mood  Assessment and Plan: Chronic abdominal pain - Plan: nortriptyline (PAMELOR) 25 MG capsule  Seasonal allergies - Plan: Albuterol Sulfate 108 (90 Base) MCG/ACT AEPB  Orders as above. Stop SNRI. Start another TCA. Low FODMAP info discussed and will mail. Keep food symptom journal. Follow up with Dr. Loletha Grayer as originally scheduled.  Call in SABA to hopefully prevent ED visit.  Follow Up Instructions: In 1 mo if unable to get in with Dr. Loletha Grayer   I discussed the assessment and treatment plan with the patient. The patient was provided an opportunity to ask questions and all were answered. The patient agreed with the plan and demonstrated an understanding of the instructions.   The patient was advised to call back or seek an in-person evaluation if the symptoms worsen or if the condition fails to improve as anticipated.   [place], DO

## 2018-07-30 ENCOUNTER — Ambulatory Visit: Payer: 59

## 2018-08-08 ENCOUNTER — Ambulatory Visit: Payer: 59

## 2018-08-12 ENCOUNTER — Ambulatory Visit (INDEPENDENT_AMBULATORY_CARE_PROVIDER_SITE_OTHER): Payer: 59

## 2018-08-12 ENCOUNTER — Other Ambulatory Visit: Payer: Self-pay

## 2018-08-12 DIAGNOSIS — Z3042 Encounter for surveillance of injectable contraceptive: Secondary | ICD-10-CM | POA: Diagnosis not present

## 2018-08-12 NOTE — Progress Notes (Signed)
Pt is in the office for depo injection, administered in L Del and pt tolerated well .Marland Kitchen Administrations This Visit    medroxyPROGESTERone (DEPO-PROVERA) injection 150 mg    Admin Date 08/12/2018 Action Given Dose 150 mg Route Intramuscular Administered By Hinton Lovely, RN

## 2018-08-13 NOTE — Progress Notes (Signed)
I have reviewed the chart and agree with nursing staff's documentation of this patient's encounter.  Mora Bellman, MD 08/13/2018 9:16 AM

## 2018-10-20 ENCOUNTER — Other Ambulatory Visit: Payer: Self-pay | Admitting: Obstetrics

## 2018-10-20 DIAGNOSIS — Z3042 Encounter for surveillance of injectable contraceptive: Secondary | ICD-10-CM

## 2018-11-01 ENCOUNTER — Ambulatory Visit (INDEPENDENT_AMBULATORY_CARE_PROVIDER_SITE_OTHER): Payer: 59

## 2018-11-01 ENCOUNTER — Other Ambulatory Visit: Payer: Self-pay

## 2018-11-01 DIAGNOSIS — Z3042 Encounter for surveillance of injectable contraceptive: Secondary | ICD-10-CM | POA: Diagnosis not present

## 2018-11-01 NOTE — Progress Notes (Signed)
Pt is in the office for depo, administered in L Del and, per pt request, and pt tolerated well. Next due 10/16-10/30 .Marland Kitchen Administrations This Visit    medroxyPROGESTERone (DEPO-PROVERA) injection 150 mg    Admin Date 11/01/2018 Action Given Dose 150 mg Route Intramuscular Administered By Hinton Lovely, RN

## 2018-11-21 ENCOUNTER — Other Ambulatory Visit: Payer: Self-pay

## 2018-11-22 ENCOUNTER — Encounter: Payer: Self-pay | Admitting: Family Medicine

## 2018-11-22 ENCOUNTER — Ambulatory Visit (INDEPENDENT_AMBULATORY_CARE_PROVIDER_SITE_OTHER): Payer: 59 | Admitting: Family Medicine

## 2018-11-22 VITALS — BP 108/76 | HR 87 | Temp 96.8°F | Ht 62.0 in | Wt 229.0 lb

## 2018-11-22 DIAGNOSIS — Z0001 Encounter for general adult medical examination with abnormal findings: Secondary | ICD-10-CM

## 2018-11-22 DIAGNOSIS — R109 Unspecified abdominal pain: Secondary | ICD-10-CM

## 2018-11-22 DIAGNOSIS — Z0000 Encounter for general adult medical examination without abnormal findings: Secondary | ICD-10-CM

## 2018-11-22 DIAGNOSIS — G8929 Other chronic pain: Secondary | ICD-10-CM | POA: Diagnosis not present

## 2018-11-22 DIAGNOSIS — L989 Disorder of the skin and subcutaneous tissue, unspecified: Secondary | ICD-10-CM | POA: Diagnosis not present

## 2018-11-22 LAB — COMPREHENSIVE METABOLIC PANEL
ALT: 21 U/L (ref 0–35)
AST: 21 U/L (ref 0–37)
Albumin: 4.3 g/dL (ref 3.5–5.2)
Alkaline Phosphatase: 47 U/L (ref 39–117)
BUN: 10 mg/dL (ref 6–23)
CO2: 25 mEq/L (ref 19–32)
Calcium: 9.6 mg/dL (ref 8.4–10.5)
Chloride: 106 mEq/L (ref 96–112)
Creatinine, Ser: 0.7 mg/dL (ref 0.40–1.20)
GFR: 95.55 mL/min (ref >60.00)
Glucose, Bld: 105 mg/dL — ABNORMAL HIGH (ref 70–99)
Potassium: 4.5 mEq/L (ref 3.5–5.1)
Sodium: 140 mEq/L (ref 135–145)
Total Bilirubin: 0.8 mg/dL (ref 0.2–1.2)
Total Protein: 7.2 g/dL (ref 6.0–8.3)

## 2018-11-22 LAB — CBC
HCT: 43.7 % (ref 36.0–46.0)
Hemoglobin: 15 g/dL (ref 12.0–15.0)
MCHC: 34.4 g/dL (ref 30.0–36.0)
MCV: 84.8 fl (ref 78.0–100.0)
Platelets: 356 10*3/uL (ref 150.0–400.0)
RBC: 5.15 Mil/uL — ABNORMAL HIGH (ref 3.87–5.11)
RDW: 12.9 % (ref 11.5–15.5)
WBC: 8.3 10*3/uL (ref 4.0–10.5)

## 2018-11-22 LAB — LIPID PANEL
Cholesterol: 179 mg/dL (ref 0–200)
HDL: 45.5 mg/dL (ref >39.00)
LDL Cholesterol: 114 mg/dL — ABNORMAL HIGH (ref 0–99)
NonHDL: 133.05
Total CHOL/HDL Ratio: 4
Triglycerides: 94 mg/dL (ref 0.0–149.0)
VLDL: 18.8 mg/dL (ref 0.0–40.0)

## 2018-11-22 MED ORDER — NORTRIPTYLINE HCL 25 MG PO CAPS: 25.0000 mg | ORAL_CAPSULE | Freq: Every day | ORAL | 2 refills | Status: DC

## 2018-11-22 MED ORDER — OMEPRAZOLE 20 MG PO CPDR: 20.0000 mg | DELAYED_RELEASE_CAPSULE | Freq: Every day | ORAL | 2 refills | Status: DC

## 2018-11-22 MED ORDER — DICYCLOMINE HCL 10 MG PO CAPS: 10.0000 mg | ORAL_CAPSULE | Freq: Four times a day (QID) | ORAL | 1 refills | Status: DC | PRN

## 2018-11-22 NOTE — Patient Instructions (Signed)
If you do not hear anything about your referral in the next 1-2 weeks, call our office and ask for an update.  Give [place] 2-3 business days to get the results of your labs back.   Keep the diet clean and stay active.  I recommend getting the flu shot in mid October. This suggestion would change if the CDC comes out with a different recommendation.   Let us know if you need anything.

## 2018-11-22 NOTE — Progress Notes (Signed)
Chief Complaint  Patient presents with  . Annual Exam     Well Woman [name] [name] is here for a complete physical.   Her last physical was >1 year ago.  Current diet: in general, a "healthy" diet. Current exercise: active with child and in house. No LMP recorded. Patient has had an injection. Seatbelt? Yes  Health Maintenance Pap/HPV- Yes Tetanus- Yes HIV screening- Yes  Past Medical History:  Diagnosis Date  . Anxiety   . Chronic headaches   . Depression   . Fistula-in-ano   . GERD (gastroesophageal reflux disease)    occ  . 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 Migraine   . Numbness and tingling of left leg    cold sensation, painful     Past Surgical History:  Procedure Laterality Date  . FISTULOTOMY N/A 06/28/2017   Procedure: FISTULOTOMY;  Surgeon: Leighton Ruff, MD;  Location: [facility];  Service: General;  Laterality: N/A;  . INCISION AND DRAINAGE ABSCESS ANAL     two separate times in ED  . INCISION AND DRAINAGE PERIRECTAL ABSCESS Left 12/21/2016   Procedure: PLACEMENT OF SETON INTO FISTULA  AND INCISION AND DRAINAGE OF PERIRECTAL ABCESS;  Surgeon: Kieth Brightly [name], MD;  Location: [facility];  Service: General;  Laterality: Left;  . IRRIGATION AND DEBRIDEMENT BUTTOCKS Left 12/28/2015   Procedure: IRRIGATION AND DEBRIDEMENT BUTTOCKS ABSCESS CHRONIC;  Surgeon: Erroll Luna, MD;  Location: [place];  Service: General;  Laterality: Left;  . LIGATION OF INTERNAL FISTULA TRACT N/A 03/08/2017   Procedure: LIGATION OF INTERNAL INTERSPHINCTERIC FISTULA TRACT;  Surgeon: Kieth Brightly, [name], MD;  Location: [place];  Service: General;  Laterality: N/A;    Medications  Current Outpatient Medications on File Prior to Visit  Medication Sig Dispense Refill  . acetaminophen (TYLENOL) 500 MG tablet Take 500 mg by mouth every 6 (six) hours as needed for mild pain.    . Albuterol Sulfate 108 (90 Base) MCG/ACT AEPB  Inhale 1-2 puffs into the lungs every 6 (six) hours as needed (sob). 1 each 1  . dicyclomine (BENTYL) 10 MG capsule Take 1 capsule (10 mg total) by mouth every 6 (six) hours as needed for spasms. 120 capsule 1  . ibuprofen (ADVIL,MOTRIN) 200 MG tablet Take 200 mg by mouth every 6 (six) hours as needed for moderate pain.     Marland Kitchen lidocaine (XYLOCAINE) 5 % ointment Apply 1 application topically as needed. 60 g 2  . medroxyPROGESTERone (DEPO-PROVERA) 150 MG/ML injection INJECT 1 ML (150 MG TOTAL) INTO THE MUSCLE EVERY 3 (THREE) MONTHS. 1 mL 0  . mesalamine (APRISO) 0.375 g 24 hr capsule Take 4 capsules (1.5 g total) by mouth daily. 120 capsule 1  . naproxen (NAPROSYN) 250 MG tablet Take 250 mg by mouth as needed.    . nortriptyline (PAMELOR) 25 MG capsule Take 1 capsule (25 mg total) by mouth at bedtime. 30 capsule 3  . ondansetron (ZOFRAN) 4 MG tablet Take 1 tablet (4 mg total) by mouth every 6 (six) hours as needed for nausea or vomiting. 30 tablet 1  . valACYclovir (VALTREX) 1000 MG tablet Take 1,000 mg by mouth daily.     Allergies Allergies  Allergen Reactions  . Gabapentin     hallunciations  . Cinnamon Other (See Comments)    Blisters on mouth and lips, coughing.    Marland Kitchen Hydrocodone Diarrhea   Review of Systems: Constitutional:  no unexpected weight changes  Eye:  no recent significant change in vision Ear/Nose/Mouth/Throat:  Ears:  no tinnitus or vertigo and no recent change in hearing Nose/Mouth/Throat:  no complaints of nasal congestion, no sore throat Cardiovascular: no chest pain Respiratory:  no cough and no shortness of breath Gastrointestinal: +chronic GI issues GU:  Female: negative for dysuria or pelvic pain Musculoskeletal/Extremities:  no pain of the joints Integumentary (Skin/Breast):  +moles on back; otherwise no abnormal skin lesions reported Neurologic:  no headaches Endocrine:  denies fatigue Hematologic/Lymphatic:  No areas of easy bleeding  Exam BP 108/76 (BP  Location: Left Arm, Patient Position: Sitting, Cuff Size: Large)   Pulse 87   Temp (!) 96.8 F (36 C) (Temporal)   Ht 5\' 2"  (1.575 m)   Wt 229 lb (103.9 kg)   SpO2 98%   BMI 41.88 kg/m  General:  well developed, well nourished, in no apparent distress Skin: various SK's and lentigos; on L lumbar region, there is an elliptically hyperpigmented patch measuring 0.7 x 0.4 cm. Otherwise no significant moles, warts, or growths Head:  no masses, lesions, or tenderness Eyes:  pupils equal and round, sclera anicteric without injection Ears:  canals without lesions, TMs shiny without retraction, no obvious effusion, no erythema Nose:  nares patent, septum midline, mucosa normal, and no drainage or sinus tenderness Throat/Pharynx:  lips and gingiva without lesion; tongue and uvula midline; non-inflamed pharynx; no exudates or postnasal drainage Neck: neck supple without adenopathy, thyromegaly, or masses Lungs:  clear to auscultation, breath sounds equal bilaterally, no respiratory distress Cardio:  regular rate and rhythm, no bruits, no LE edema Abdomen:  abdomen soft, diffuse ttp; bowel sounds normal; no masses or organomegaly Genital: Defer to GYN Musculoskeletal:  symmetrical muscle groups noted without atrophy or deformity Extremities:  no clubbing, cyanosis, or edema, no deformities, no skin discoloration Neuro:  gait normal; deep tendon reflexes normal and symmetric Psych: well oriented with normal range of affect and appropriate judgment/insight  Assessment and Plan  Well adult exam - Plan: CBC, Comprehensive metabolic panel, Lipid panel  Chronic abdominal pain - Plan: nortriptyline (PAMELOR) 25 MG capsule  Skin lesion - Plan: Ambulatory referral to Dermatology   Well 33 y.o. female. Counseled on diet and exercise. Form for work filled out.  Other orders as above. Follow up in 6 mo for med ck or prn. The patient voiced understanding and agreement to the plan.  [place], DO 11/22/18 11:01 AM

## 2019-01-20 ENCOUNTER — Other Ambulatory Visit: Payer: Self-pay | Admitting: Obstetrics

## 2019-01-20 DIAGNOSIS — Z3042 Encounter for surveillance of injectable contraceptive: Secondary | ICD-10-CM

## 2019-01-23 ENCOUNTER — Ambulatory Visit (INDEPENDENT_AMBULATORY_CARE_PROVIDER_SITE_OTHER): Payer: 59

## 2019-01-23 ENCOUNTER — Other Ambulatory Visit: Payer: Self-pay

## 2019-01-23 DIAGNOSIS — Z3042 Encounter for surveillance of injectable contraceptive: Secondary | ICD-10-CM

## 2019-01-23 MED ORDER — MEDROXYPROGESTERONE ACETATE 150 MG/ML IM SUSP
150.0000 mg | Freq: Once | INTRAMUSCULAR | Status: AC
  Administered 2019-01-23: 16:00:00 150 mg via INTRAMUSCULAR

## 2019-01-23 NOTE — Progress Notes (Signed)
Patient presents for depo injection. Pt is within her window. Injection given in left deltoid per pt request. Pt tolerated well. Next depo due 1/7-1/21.

## 2019-01-23 NOTE — Progress Notes (Signed)
I have reviewed this chart and agree with the RN/CMA assessment and management.    K. Meryl Davis, M.D. Attending [facility] (Faculty Practice)   

## 2019-03-03 ENCOUNTER — Ambulatory Visit: Payer: 59 | Admitting: Obstetrics

## 2019-03-17 ENCOUNTER — Ambulatory Visit (INDEPENDENT_AMBULATORY_CARE_PROVIDER_SITE_OTHER): Payer: 59 | Admitting: Obstetrics

## 2019-03-17 ENCOUNTER — Other Ambulatory Visit: Payer: Self-pay

## 2019-03-17 ENCOUNTER — Encounter: Payer: Self-pay | Admitting: Obstetrics

## 2019-03-17 VITALS — Wt 230.0 lb

## 2019-03-17 DIAGNOSIS — Z6841 Body Mass Index (BMI) 40.0 and over, adult: Secondary | ICD-10-CM

## 2019-03-17 DIAGNOSIS — Z01419 Encounter for gynecological examination (general) (routine) without abnormal findings: Secondary | ICD-10-CM

## 2019-03-17 DIAGNOSIS — Z124 Encounter for screening for malignant neoplasm of cervix: Secondary | ICD-10-CM

## 2019-03-17 DIAGNOSIS — Z113 Encounter for screening for infections with a predominantly sexual mode of transmission: Secondary | ICD-10-CM | POA: Diagnosis not present

## 2019-03-17 DIAGNOSIS — N898 Other specified noninflammatory disorders of vagina: Secondary | ICD-10-CM

## 2019-03-17 DIAGNOSIS — Z0000 Encounter for general adult medical examination without abnormal findings: Secondary | ICD-10-CM

## 2019-03-17 DIAGNOSIS — Z3042 Encounter for surveillance of injectable contraceptive: Secondary | ICD-10-CM

## 2019-03-17 DIAGNOSIS — B373 Candidiasis of vulva and vagina: Secondary | ICD-10-CM | POA: Diagnosis not present

## 2019-03-18 LAB — CERVICOVAGINAL ANCILLARY ONLY
Bacterial Vaginitis (gardnerella): NEGATIVE
Candida Glabrata: NEGATIVE
Candida Vaginitis: POSITIVE — AB
Chlamydia: NEGATIVE
Comment: NEGATIVE
Comment: NEGATIVE
Comment: NEGATIVE
Comment: NEGATIVE
Comment: NEGATIVE
Comment: NORMAL
Neisseria Gonorrhea: NEGATIVE
Trichomonas: NEGATIVE

## 2019-03-19 ENCOUNTER — Other Ambulatory Visit: Payer: Self-pay | Admitting: Obstetrics

## 2019-03-19 DIAGNOSIS — B379 Candidiasis, unspecified: Secondary | ICD-10-CM

## 2019-03-19 MED ORDER — FLUCONAZOLE 150 MG PO TABS: 150.0000 mg | ORAL_TABLET | Freq: Once | ORAL | 0 refills | Status: AC

## 2019-03-21 LAB — CYTOLOGY - PAP
Comment: NEGATIVE
Diagnosis: NEGATIVE
High risk HPV: NEGATIVE

## 2019-03-24 ENCOUNTER — Encounter: Payer: Self-pay | Admitting: Obstetrics

## 2019-03-24 NOTE — Progress Notes (Signed)
Subjective:[name] [name] is a 33 y.o. female here for a routine exam.  Current complaints: Chronic headaches..    Personal health questionnaire:  Is [name], have a family history of breast and/or ovarian cancer: no Is there a family history of uterine cancer diagnosed at age < 11, gastrointestinal cancer, urinary tract cancer, family member who is a [name]: no Is the patient overweight and hypertensive, family history of diabetes, personal history of gestational diabetes, preeclampsia or PCOS: no Is patient over 31, have PCOS,  family history of premature CHD under age 55, diabetes, smoke, have hypertension or peripheral artery disease:  no At any time, has a partner hit, kicked or otherwise hurt or frightened you?: no Over the past 2 weeks, have you felt down, depressed or hopeless?: no Over the past 2 weeks, have you felt little interest or pleasure in doing things?:no   Gynecologic History No LMP recorded. Patient has had an injection. Contraception: Depo-Provera injections Last Pap: 03-17-2019. Results were: normal Last mammogram: n/a. Results were: n/a  Obstetric History OB History  Gravida Para Term Preterm AB Living  1 1 1     1   SAB TAB Ectopic Multiple Live Births          1    # Outcome Date GA Lbr Len/2nd Weight Sex Delivery Anes PTL Lv  1 Term 08/16/13 [redacted]w[redacted]d 23:40 / 00:38 6 lb 14 oz (3.118 kg) M Vag-Spont EPI  LIV    Past Medical History:  Diagnosis Date  . Anxiety   . Chronic headaches   . Depression   . Fistula-in-ano   . GERD (gastroesophageal reflux disease)    occ  . 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 Migraine   . Numbness and tingling of left leg    cold sensation, painful    Past Surgical History:  Procedure Laterality Date  . FISTULOTOMY N/A 06/28/2017   Procedure: FISTULOTOMY;  Surgeon: Leighton Ruff, MD;  Location: [facility];  Service: General;  Laterality:  N/A;  . INCISION AND DRAINAGE ABSCESS ANAL     two separate times in ED  . INCISION AND DRAINAGE PERIRECTAL ABSCESS Left 12/21/2016   Procedure: PLACEMENT OF SETON INTO FISTULA  AND INCISION AND DRAINAGE OF PERIRECTAL ABCESS;  Surgeon: Kieth Brightly [name], MD;  [facility];  Service: General;  Laterality: Left;  . IRRIGATION AND DEBRIDEMENT BUTTOCKS Left 12/28/2015   Procedure: IRRIGATION AND DEBRIDEMENT BUTTOCKS ABSCESS CHRONIC;  Surgeon: Erroll Luna, MD;  Location: [place];  Service: General;  Laterality: Left;  . LIGATION OF INTERNAL FISTULA TRACT N/A 03/08/2017   Procedure: LIGATION OF INTERNAL INTERSPHINCTERIC FISTULA TRACT;  Surgeon: Kieth Brightly, [name], MD;  Location: [place];  Service: General;  Laterality: N/A;     Current Outpatient Medications:  .  acetaminophen (TYLENOL) 500 MG tablet, Take 500 mg by mouth every 6 (six) hours as needed for mild pain., Disp: , Rfl:  .  Albuterol Sulfate 108 (90 Base) MCG/ACT AEPB, Inhale 1-2 puffs into the lungs every 6 (six) hours as needed (sob)., Disp: 1 each, Rfl: 1 .  dicyclomine (BENTYL) 10 MG capsule, Take 1 capsule (10 mg total) by mouth every 6 (six) hours as needed for spasms., Disp: 120 capsule, Rfl: 1 .  medroxyPROGESTERone (DEPO-PROVERA) 150 MG/ML injection, INJECT 1 ML (150 MG TOTAL) INTO THE MUSCLE EVERY 3 (THREE) MONTHS., Disp: 1  mL, Rfl: 0 .  mesalamine (APRISO) 0.375 g 24 hr capsule, Take 4 capsules (1.5 g total) by mouth daily., Disp: 120 capsule, Rfl: 1 .  nortriptyline (PAMELOR) 25 MG capsule, Take 1 capsule (25 mg total) by mouth at bedtime., Disp: 90 capsule, Rfl: 2 .  omeprazole (PRILOSEC) 20 MG capsule, Take 1 capsule (20 mg total) by mouth daily., Disp: 90 capsule, Rfl: 2 .  valACYclovir (VALTREX) 1000 MG tablet, Take 1,000 mg by mouth daily., Disp: , Rfl:   Current Facility-Administered Medications:  .  medroxyPROGESTERone (DEPO-PROVERA) injection 150 mg, 150 mg, Intramuscular,  Q90 days, Shelly Bombard, MD, 150 mg at 11/01/18 0930 Allergies  Allergen Reactions  . Gabapentin     hallunciations  . Cinnamon Other (See Comments)    Blisters on mouth and lips, coughing.    Marland Kitchen Hydrocodone Diarrhea    Social History   Tobacco Use  . Smoking status: Never Smoker  . Smokeless tobacco: Never Used  Substance Use Topics  . Alcohol use: Not Currently    Family History  Problem Relation Age of Onset  . Alcoholism Father   . Epilepsy Father   . Heart disease Father   . Breast cancer Maternal Aunt   . Colon cancer Neg Hx   . Esophageal cancer Neg Hx       Review of Systems  Constitutional: negative for fatigue and weight loss Respiratory: negative for cough and wheezing Cardiovascular: negative for chest pain, fatigue and palpitations Gastrointestinal: negative for abdominal pain and change in bowel habits Musculoskeletal:negative for myalgias Neurological: negative for gait problems and tremors Behavioral/Psych: negative for abusive relationship, depression Endocrine: negative for temperature intolerance    Genitourinary:negative for abnormal menstrual periods, genital lesions, hot flashes, sexual problems and vaginal discharge Integument/breast: negative for breast lump, breast tenderness, nipple discharge and skin lesion(s)    Objective:       Wt 230 lb (104.3 kg)   BMI 42.07 kg/m  General:   alert  Skin:   no rash or abnormalities  Lungs:   clear to auscultation bilaterally  Heart:   regular rate and rhythm, S1, S2 normal, no murmur, click, rub or gallop  Breasts:   normal without suspicious masses, skin or nipple changes or axillary nodes  Abdomen:  normal findings: no organomegaly, soft, non-tender and no hernia  Pelvis:  External genitalia: normal general appearance Urinary system: urethral meatus normal and bladder without fullness, nontender Vaginal: normal without tenderness, induration or masses Cervix: normal appearance Adnexa: normal  bimanual exam Uterus: anteverted and non-tender, normal size   Lab Review Urine pregnancy test Labs reviewed yes Radiologic studies reviewed no  50% of 25 min visit spent on counseling and coordination of care.   Assessment:     1. Encounter for routine gynecological examination with Papanicolaou smear of cervix Rx: - Cytology - PAP( [place]) - Cervicovaginal ancillary only( [place])  2. Encounter for surveillance of injectable contraceptive - pleased with Depo  3. Class 3 severe obesity due to excess calories with serious comorbidity and body mass index (BMI) of 40.0 to 44.9 in adult [facility])     Plan:    Education reviewed: calcium supplements, depression evaluation, low fat, low cholesterol diet, safe sex/STD prevention, self breast exams and weight bearing exercise. Contraception: Depo-Provera injections. Follow up in: 1 year.   No orders of the defined types were placed in this encounter.  No orders of the defined types were placed in this encounter.   Baltazar Najjar  A, MD 03/24/2019 9:42 AM

## 2019-04-15 ENCOUNTER — Other Ambulatory Visit: Payer: Self-pay

## 2019-04-15 DIAGNOSIS — Z3042 Encounter for surveillance of injectable contraceptive: Secondary | ICD-10-CM

## 2019-04-15 MED ORDER — MEDROXYPROGESTERONE ACETATE 150 MG/ML IM SUSP: 150.0000 mg | INTRAMUSCULAR | 5 refills | Status: DC

## 2019-04-15 NOTE — Progress Notes (Signed)
Depo rx refill sent, pt made aware.

## 2019-04-17 ENCOUNTER — Ambulatory Visit: Payer: 59

## 2019-04-23 ENCOUNTER — Ambulatory Visit (INDEPENDENT_AMBULATORY_CARE_PROVIDER_SITE_OTHER): Payer: 59

## 2019-04-23 ENCOUNTER — Other Ambulatory Visit: Payer: Self-pay

## 2019-04-23 DIAGNOSIS — Z3042 Encounter for surveillance of injectable contraceptive: Secondary | ICD-10-CM

## 2019-04-23 MED ORDER — MEDROXYPROGESTERONE ACETATE 150 MG/ML IM SUSP
150.0000 mg | Freq: Once | INTRAMUSCULAR | Status: AC
  Administered 2019-04-23: 150 mg via INTRAMUSCULAR

## 2019-04-23 NOTE — Progress Notes (Signed)
Pt is here for a depo injection.  Pt tolerated injection well in the LD. Pt only want depo in the left arm. Pt advised to make next appointment around April 4-21. -EH/RMA

## 2019-07-11 ENCOUNTER — Other Ambulatory Visit: Payer: Self-pay

## 2019-07-11 ENCOUNTER — Ambulatory Visit (INDEPENDENT_AMBULATORY_CARE_PROVIDER_SITE_OTHER): Payer: 59

## 2019-07-11 DIAGNOSIS — Z3042 Encounter for surveillance of injectable contraceptive: Secondary | ICD-10-CM | POA: Diagnosis not present

## 2019-07-11 MED ORDER — MEDROXYPROGESTERONE ACETATE 150 MG/ML IM SUSP
150.0000 mg | Freq: Once | INTRAMUSCULAR | Status: AC
  Administered 2019-07-11: 150 mg via INTRAMUSCULAR

## 2019-07-11 NOTE — Progress Notes (Signed)
Pt presents for Depo Injection   Last Depo  04/23/19 LD   Next Depo 09/26/19-10/10/19  Depo given w/o any problems LD   Per pt request

## 2019-09-29 ENCOUNTER — Ambulatory Visit: Payer: 59

## 2019-09-30 ENCOUNTER — Other Ambulatory Visit: Payer: Self-pay

## 2019-09-30 ENCOUNTER — Ambulatory Visit (INDEPENDENT_AMBULATORY_CARE_PROVIDER_SITE_OTHER): Payer: 59

## 2019-09-30 DIAGNOSIS — Z3042 Encounter for surveillance of injectable contraceptive: Secondary | ICD-10-CM | POA: Diagnosis not present

## 2019-09-30 NOTE — Progress Notes (Signed)
Nurse visit for pt supply Depo Pt is on time for injection Depo given RD without complaint Next Depo due Sept 14-28, pt agrees

## 2019-09-30 NOTE — Progress Notes (Signed)
Patient was assessed and managed by nursing staff during this encounter. I have reviewed the chart and agree with the documentation and plan. I have also made any necessary editorial changes.  Griffin Basil, MD 09/30/2019 4:26 PM

## 2019-10-11 IMAGING — US US ABDOMEN LIMITED
1 series · 14 of 25 positions shown · non-contrast
Comparison: None.

CLINICAL DATA: 32-year-old female with right upper quadrant
abdominal pain.

EXAM:
ULTRASOUND ABDOMEN LIMITED RIGHT UPPER QUADRANT

[Series 1: us abdomen limited · 14 of 67 slices shown]
[im 1/67]
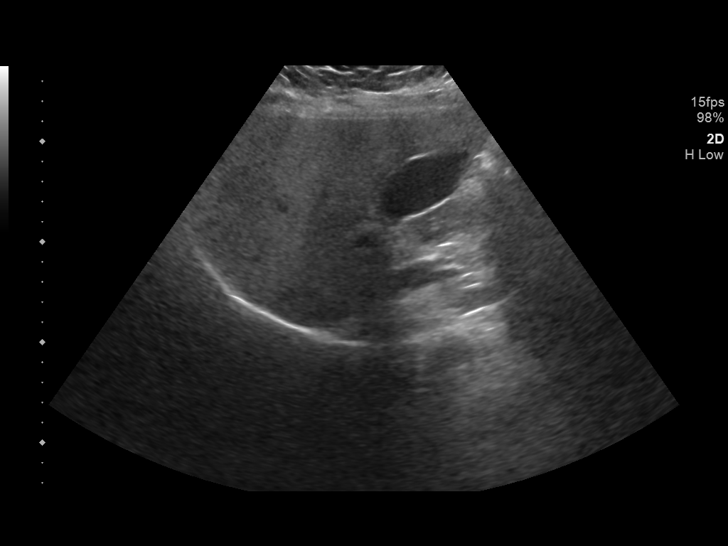
[im 6/67]
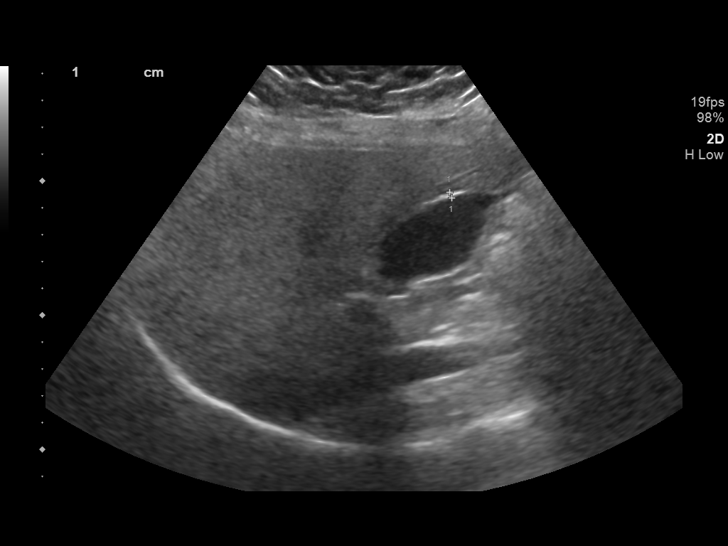
[im 12/67]
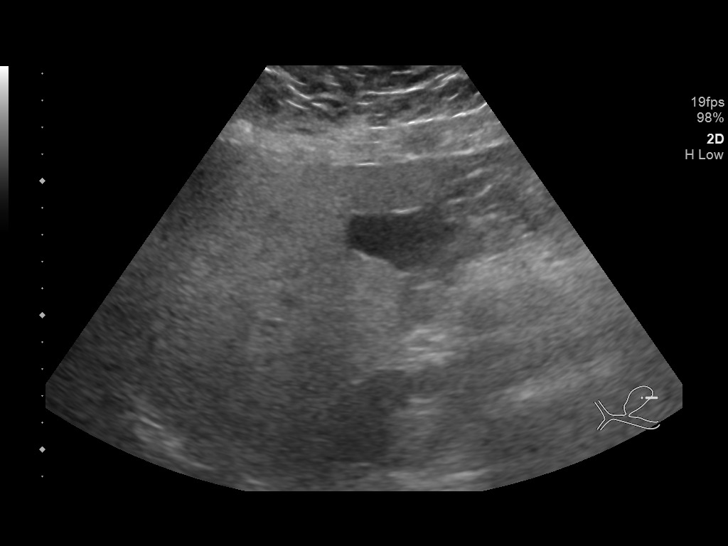
[im 17/67]
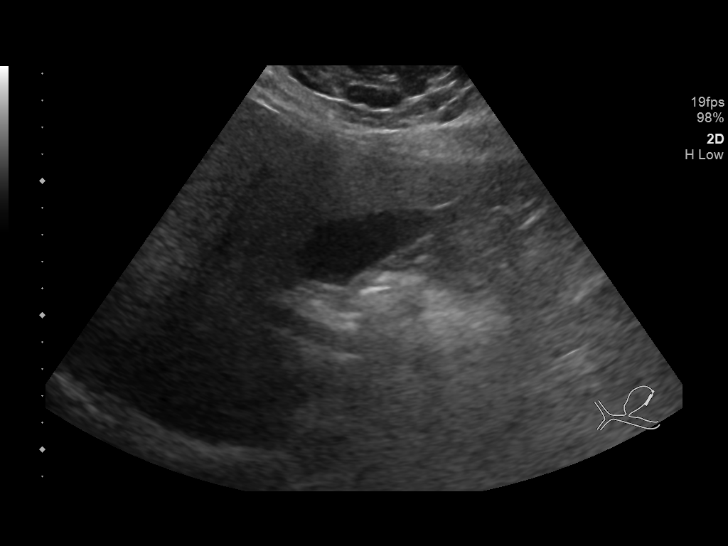
[im 23/67]
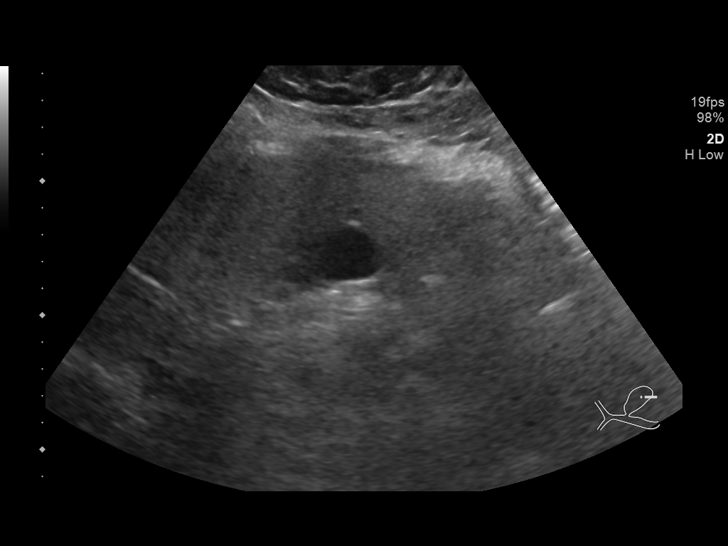
[im 25/67]
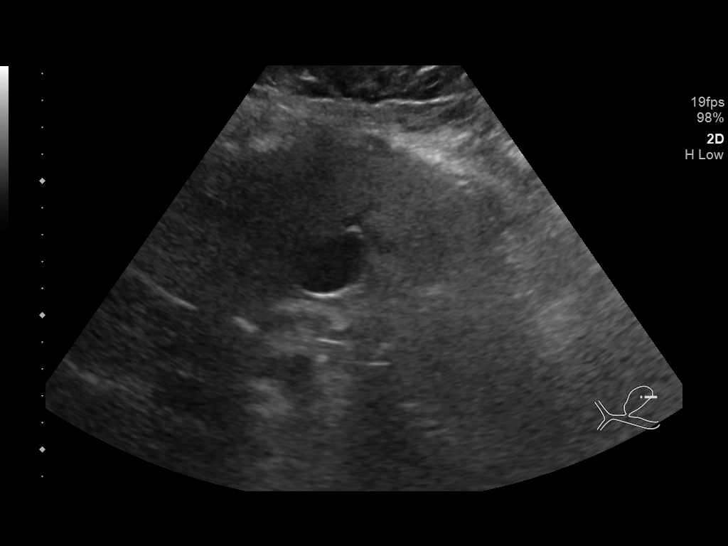
[im 31/67]
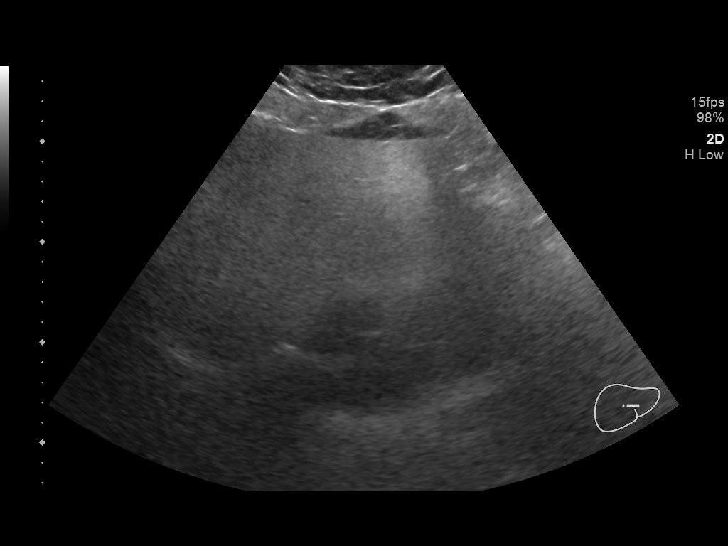
[im 36/67]
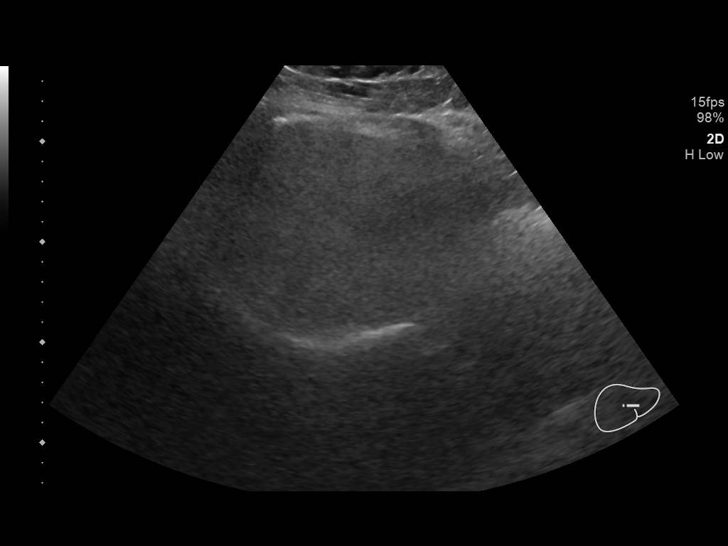
[im 42/67]
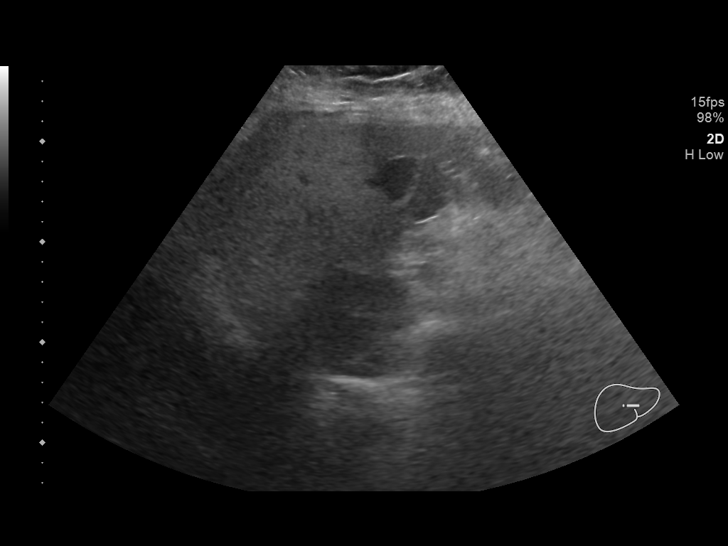
[im 45/67]
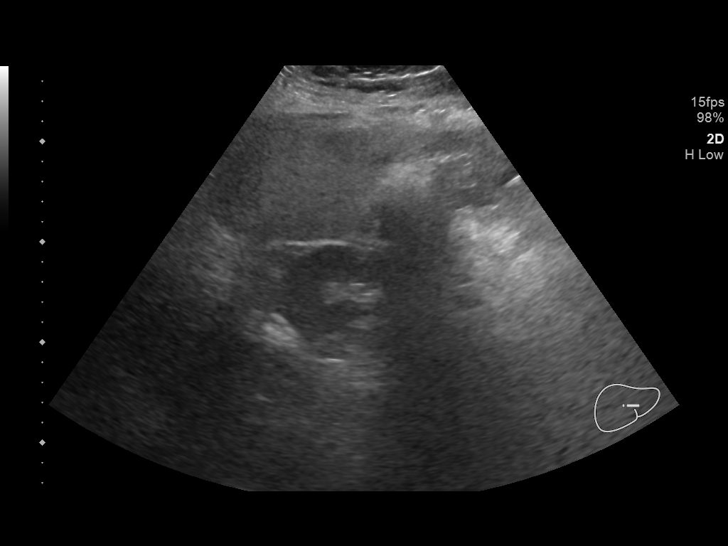
[im 50/67]
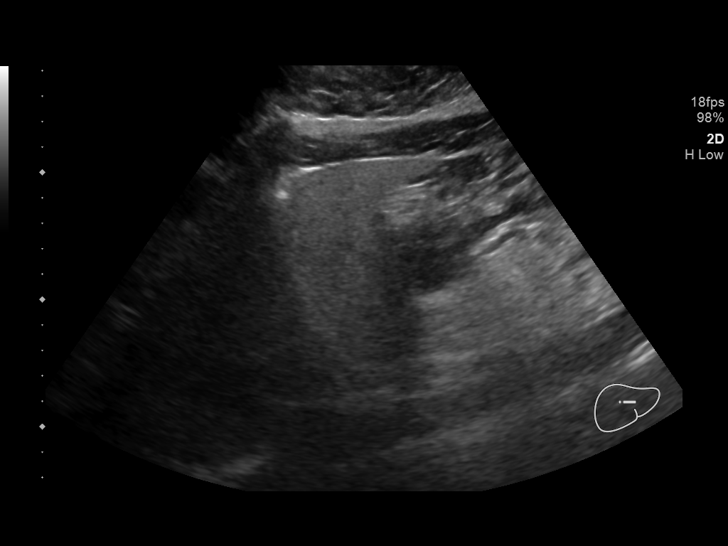
[im 56/67]
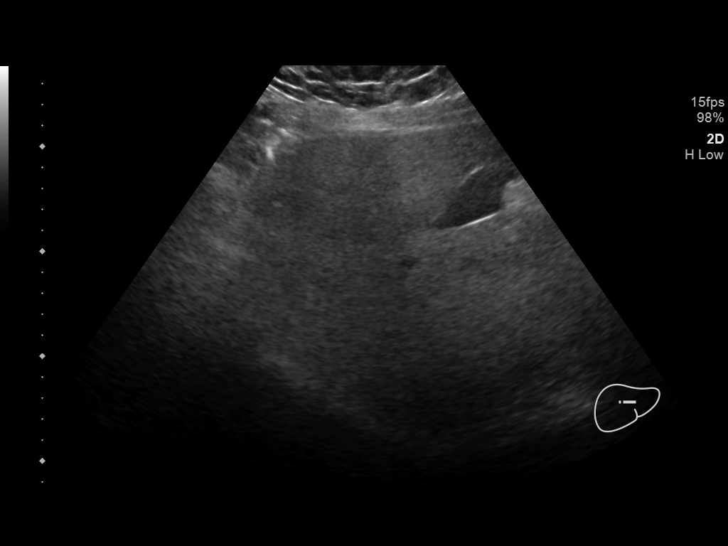
[im 61/67]
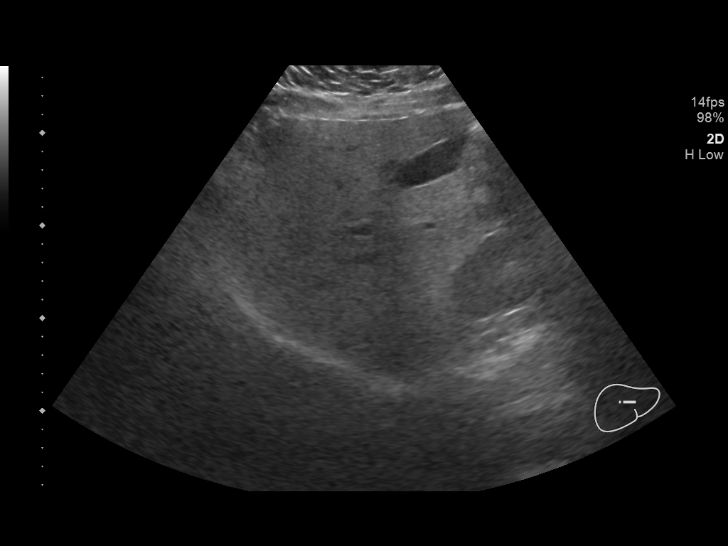
[im 67/67]
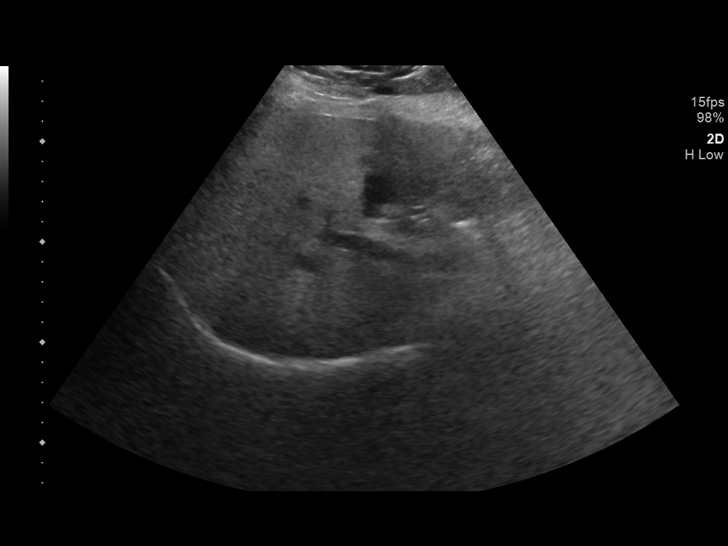

[14 of 25 positions shown; findings below may reference images not displayed]

FINDINGS: Gallbladder:

No gallstones or wall thickening visualized. No sonographic Murphy
sign noted by sonographer.

Common bile duct:

Diameter: 3 mm

Liver:

Diffuse increased liver echogenicity most consistent with fatty
infiltration. Portal vein is patent on color Doppler imaging with
normal direction of blood flow towards the liver.
IMPRESSION: 1. No gallstone.
2. Moderate to advanced fatty liver.

## 2019-11-26 ENCOUNTER — Encounter: Payer: Self-pay | Admitting: Family Medicine

## 2019-11-26 ENCOUNTER — Other Ambulatory Visit: Payer: Self-pay

## 2019-11-26 ENCOUNTER — Other Ambulatory Visit: Payer: Self-pay | Admitting: Family Medicine

## 2019-11-26 ENCOUNTER — Ambulatory Visit (INDEPENDENT_AMBULATORY_CARE_PROVIDER_SITE_OTHER): Payer: 59 | Admitting: Family Medicine

## 2019-11-26 ENCOUNTER — Telehealth: Payer: Self-pay | Admitting: Gastroenterology

## 2019-11-26 VITALS — BP 122/82 | HR 113 | Temp 98.2°F | Ht 62.0 in | Wt 247.0 lb

## 2019-11-26 DIAGNOSIS — R109 Unspecified abdominal pain: Secondary | ICD-10-CM

## 2019-11-26 DIAGNOSIS — Z0000 Encounter for general adult medical examination without abnormal findings: Secondary | ICD-10-CM | POA: Diagnosis not present

## 2019-11-26 DIAGNOSIS — G8929 Other chronic pain: Secondary | ICD-10-CM | POA: Diagnosis not present

## 2019-11-26 MED ORDER — DICYCLOMINE HCL 20 MG PO TABS: 20.0000 mg | ORAL_TABLET | Freq: Three times a day (TID) | ORAL | 3 refills | Status: DC

## 2019-11-26 MED ORDER — NORTRIPTYLINE HCL 50 MG PO CAPS: 50.0000 mg | ORAL_CAPSULE | Freq: Every day | ORAL | 3 refills | Status: DC

## 2019-11-26 NOTE — Telephone Encounter (Signed)
Pt is requesting a call back from a nurse, pt is experiencing trouble with her esophagus and would like to be seen sometime soon with Dr Bryan Lemma, nothing available until 10/6 but pt would like to be seen sometime soon if possible.

## 2019-11-26 NOTE — Telephone Encounter (Signed)
Spoke with patient.  She states she is having upper abd pain ? Esophagitis.  An appointment was given for 01/07/20.

## 2019-11-26 NOTE — Progress Notes (Signed)
Chief Complaint  Patient presents with  . Annual Exam     Well Woman [name] [name] is here for a complete physical.   Her last physical was >1 year ago.  Current diet: in general, diet is limited due to chronic issues. Current exercise: walking. Contraception? Yes Fatigue out of ordinary? Yes Seatbelt? Yes   Patient has worsening abdominal pain.  She has not seen a gastroenterologist since February 2020.  All testing has been unremarkable.  Bentyl helps a little bit when she takes it at night.  Nortriptyline 25 mg nightly helps her sleep but has not been helpful with any other symptom.  Mesalamine was not helpful for her.  She has associated fecal urgency alternating with constipation.  She does take melatonin.  She has associated diffuse musculoskeletal pain as well.  No injury.   Health Maintenance Pap/HPV- Yes Tetanus- Yes HIV screening- Yes Hep C screening- No  Past Medical History:  Diagnosis Date  . Anxiety   . Chronic headaches   . Depression   . Fistula-in-ano   . GERD (gastroesophageal reflux disease)    occ  . 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 Migraine   . Numbness and tingling of left leg    cold sensation, painful     Past Surgical History:  Procedure Laterality Date  . FISTULOTOMY N/A 06/28/2017   Procedure: FISTULOTOMY;  Surgeon: Leighton Ruff, MD;  [facility];  Service: General;  Laterality: N/A;  . INCISION AND DRAINAGE ABSCESS ANAL     two separate times in ED  . INCISION AND DRAINAGE PERIRECTAL ABSCESS Left 12/21/2016   Procedure: PLACEMENT OF SETON INTO FISTULA  AND INCISION AND DRAINAGE OF PERIRECTAL ABCESS;  Surgeon: Kieth Brightly [name], MD;  [facility];  Service: General;  Laterality: Left;  . IRRIGATION AND DEBRIDEMENT BUTTOCKS Left 12/28/2015   Procedure: IRRIGATION AND DEBRIDEMENT BUTTOCKS ABSCESS CHRONIC;  Surgeon: Erroll Luna, MD;  Location: [place];  Service: General;   Laterality: Left;  . LIGATION OF INTERNAL FISTULA TRACT N/A 03/08/2017   Procedure: LIGATION OF INTERNAL INTERSPHINCTERIC FISTULA TRACT;  Surgeon: Kieth Brightly, [name], MD;  Location: [place];  Service: General;  Laterality: N/A;    Medications  Current Outpatient Medications on File Prior to Visit  Medication Sig Dispense Refill  . acetaminophen (TYLENOL) 500 MG tablet Take 500 mg by mouth every 6 (six) hours as needed for mild pain.    . Albuterol Sulfate 108 (90 Base) MCG/ACT AEPB Inhale 1-2 puffs into the lungs every 6 (six) hours as needed (sob). 1 each 1  . medroxyPROGESTERone (DEPO-PROVERA) 150 MG/ML injection Inject 1 mL (150 mg total) into the muscle every 3 (three) months. 1 mL 5  . omeprazole (PRILOSEC) 20 MG capsule Take 1 capsule (20 mg total) by mouth daily. 90 capsule 2  . valACYclovir (VALTREX) 1000 MG tablet Take 1,000 mg by mouth daily.     Current Facility-Administered Medications on File Prior to Visit  Medication Dose Route Frequency Provider Last Rate Last Admin  . medroxyPROGESTERone (DEPO-PROVERA) injection 150 mg  150 mg Intramuscular Q90 days Shelly Bombard, MD   150 mg at 09/30/19 0150     Allergies Allergies  Allergen Reactions  . Gabapentin     hallunciations  . Cinnamon Other (See Comments)    Blisters on mouth and lips, coughing.    Marland Kitchen Hydrocodone Diarrhea    Review of Systems: Constitutional:  +  Weight gain Eye:  no recent significant change in vision Ear/Nose/Mouth/Throat:  Ears:  no tinnitus or vertigo and no recent change in hearing Nose/Mouth/Throat:  no complaints of nasal congestion, no sore throat Cardiovascular: no chest pain Respiratory:  no cough and no shortness of breath Gastrointestinal:  +abdominal pain GU:  Female: negative for dysuria or pelvic pain Musculoskeletal/Extremities:+ Diffuse pain Integumentary (Skin/Breast):  no abnormal skin lesions reported Neurologic:  no headaches Endocrine:   +fatigue Hematologic/Lymphatic:  No areas of easy bleeding  Exam BP 122/82 (BP Location: Left Arm, Patient Position: Sitting, Cuff Size: Large)   Pulse (!) 113   Temp 98.2 F (36.8 C) (Oral)   Ht 5\' 2"  (1.575 m)   Wt 247 lb (112 kg)   SpO2 97%   BMI 45.18 kg/m  General:  well developed, well nourished, in no apparent distress Skin:  no significant moles, warts, or growths Head:  no masses, lesions, or tenderness Eyes:  pupils equal and round, sclera anicteric without injection Ears:  canals without lesions, TMs shiny without retraction, no obvious effusion, no erythema Nose:  nares patent, septum midline, mucosa normal, and no drainage or sinus tenderness Throat/Pharynx:  lips and gingiva without lesion; tongue and uvula midline; non-inflamed pharynx; no exudates or postnasal drainage Neck: neck supple without adenopathy, thyromegaly, or masses Lungs:  clear to auscultation, breath sounds equal bilaterally, no respiratory distress Cardio:  regular rate and rhythm, no bruits, no LE edema Abdomen:  abdomen soft, diffusely tender to palpation, worse in the epigastric and suprapubic region, no distention; bowel sounds normal; no masses or organomegaly Genital: Defer to GYN Musculoskeletal:  + Multiple tender points on her back paraspinally Extremities:  no clubbing, cyanosis, or edema, no deformities, no skin discoloration Neuro:  gait normal; deep tendon reflexes normal and symmetric Psych: well oriented with normal range of affect and appropriate judgment/insight  Assessment and Plan  Well adult exam - Plan: Hepatitis C antibody, CBC, Comprehensive metabolic panel, TSH, VITAMIN D 25 Hydroxy (Vit-D Deficiency, Fractures), Iron Binding Cap (TIBC)([facility]), Ferritin, CANCELED: IBC + Ferritin  Other chronic pain - Plan: nortriptyline (PAMELOR) 50 MG capsule  Chronic abdominal pain - Plan: dicyclomine (BENTYL) 20 MG tablet, nortriptyline (PAMELOR) 50 MG capsule   Well 34 y.o.  female. Counseled on diet and exercise. Other orders as above. I think she needs to follow-up with Dr. Bryan Lemma.  Ask him about IV/FD guard.  L-carnitine/co-Q10 also recommended to try.  We will increase her dose of nortriptyline to 50 mg nightly.  Also increase as needed Bentyl to 20 mg.  She likely has a component of fibromyalgia.  Will consider gabapentin versus pregabalin in the future.  Could also consider duloxetine though she did not do well with venlafaxine. Follow up in 6 weeks to recheck. The patient voiced understanding and agreement to the plan.   [place], DO 11/26/19 12:00 PM

## 2019-11-26 NOTE — Patient Instructions (Addendum)
Give [place] 2-3 business days to get the results of your labs back.   I would reach out to Dr. Bryan Lemma. Ask him his opinion on the next steps and also about FD [place] and [place].    Consider L Carnitine in combination with CoQ10. These are over the counter supplements.   Keep the diet clean and stay active.  Consider weight resistance exercise.  I recommend getting the flu shot in mid October. This suggestion would change if the CDC comes out with a different recommendation.   I recommend either [employer] or [place] vaccination. Please reach out to me if you have any question.  [email or web address]  Let us know if you need anything.

## 2019-11-27 LAB — COMPREHENSIVE METABOLIC PANEL
AG Ratio: 1.4 (calc) (ref 1.0–2.5)
ALT: 29 U/L (ref 6–29)
AST: 33 U/L — ABNORMAL HIGH (ref 10–30)
Albumin: 4 g/dL (ref 3.6–5.1)
Alkaline phosphatase (APISO): 46 U/L (ref 31–125)
BUN: 9 mg/dL (ref 7–25)
CO2: 21 mmol/L (ref 20–32)
Calcium: 9.3 mg/dL (ref 8.6–10.2)
Chloride: 107 mmol/L (ref 98–110)
Creat: 0.68 mg/dL (ref 0.50–1.10)
Globulin: 2.9 g/dL (calc) (ref 1.9–3.7)
Glucose, Bld: 104 mg/dL — ABNORMAL HIGH (ref 65–99)
Potassium: 4.2 mmol/L (ref 3.5–5.3)
Sodium: 139 mmol/L (ref 135–146)
Total Bilirubin: 0.7 mg/dL (ref 0.2–1.2)
Total Protein: 6.9 g/dL (ref 6.1–8.1)

## 2019-11-27 LAB — CBC
HCT: 46 % — ABNORMAL HIGH (ref 35.0–45.0)
Hemoglobin: 15.5 g/dL (ref 11.7–15.5)
MCH: 28 pg (ref 27.0–33.0)
MCHC: 33.7 g/dL (ref 32.0–36.0)
MCV: 83.2 fL (ref 80.0–100.0)
MPV: 9.9 fL (ref 7.5–12.5)
Platelets: 343 10*3/uL (ref 140–400)
RBC: 5.53 10*6/uL — ABNORMAL HIGH (ref 3.80–5.10)
RDW: 12.7 % (ref 11.0–15.0)
WBC: 8.1 10*3/uL (ref 3.8–10.8)

## 2019-11-27 LAB — TSH: TSH: 1.16 mIU/L

## 2019-11-27 LAB — VITAMIN D 25 HYDROXY (VIT D DEFICIENCY, FRACTURES): Vit D, 25-Hydroxy: 17 ng/mL — ABNORMAL LOW (ref 30–100)

## 2019-11-27 LAB — IRON, TOTAL/TOTAL IRON BINDING CAP
%SAT: 33 % (calc) (ref 16–45)
Iron: 141 ug/dL (ref 40–190)
TIBC: 423 mcg/dL (calc) (ref 250–450)

## 2019-11-27 LAB — HEPATITIS C ANTIBODY
Hepatitis C Ab: NONREACTIVE
SIGNAL TO CUT-OFF: 0.01 (ref <1.00)

## 2019-11-27 LAB — FERRITIN: Ferritin: 63 ng/mL (ref 16–154)

## 2019-11-28 ENCOUNTER — Other Ambulatory Visit: Payer: Self-pay | Admitting: Family Medicine

## 2019-11-28 DIAGNOSIS — E559 Vitamin D deficiency, unspecified: Secondary | ICD-10-CM

## 2019-11-28 DIAGNOSIS — R945 Abnormal results of liver function studies: Secondary | ICD-10-CM

## 2019-12-03 ENCOUNTER — Encounter: Payer: Self-pay | Admitting: Gastroenterology

## 2019-12-03 ENCOUNTER — Ambulatory Visit: Payer: 59 | Admitting: Gastroenterology

## 2019-12-03 VITALS — BP 110/72 | HR 108 | Ht 62.0 in | Wt 246.1 lb

## 2019-12-03 DIAGNOSIS — K219 Gastro-esophageal reflux disease without esophagitis: Secondary | ICD-10-CM | POA: Diagnosis not present

## 2019-12-03 DIAGNOSIS — R1084 Generalized abdominal pain: Secondary | ICD-10-CM | POA: Diagnosis not present

## 2019-12-03 DIAGNOSIS — R112 Nausea with vomiting, unspecified: Secondary | ICD-10-CM

## 2019-12-03 DIAGNOSIS — K589 Irritable bowel syndrome without diarrhea: Secondary | ICD-10-CM | POA: Diagnosis not present

## 2019-12-03 DIAGNOSIS — R1013 Epigastric pain: Secondary | ICD-10-CM

## 2019-12-03 NOTE — Patient Instructions (Addendum)
If you are age 35 or older, your body mass index should be between 23-30. Your Body mass index is 45.02 kg/m. If this is out of the aforementioned range listed, please consider follow up with your Primary Care Provider.  If you are age 56 or younger, your body mass index should be between 19-25. Your Body mass index is 45.02 kg/m. If this is out of the aformentioned range listed, please consider follow up with your Primary Care Provider.   You have been given samples of FDgard. Take as directed.  This is over-the-counter. Start IBgard.  Take as directed.  This is over-the-counter as well.  Follow up in six months.  Please call the office for an appointment as the schedule is not available  It was a pleasure to see you today!  Vito Cirigliano, D.O.

## 2019-12-03 NOTE — Progress Notes (Signed)
P  Chief Complaint:    Abdominal pain, nausea/vomiting, change in bowel habits  GI History: 34 year old female initially seen by me on 01/21/2018 for evaluation of abdominal pain alternating bowel habits. She does have a longer standing history of GI symptoms, attributed to IBS, but with superimposed RUQ/MEG stabbing pain.Was evaluated with EGD/colonoscopy as outlined below.  1) RUQ/MEG pain: Started 12/2017. Symptoms last throughout the day with positive nocturnal symptoms, and not related to p.o. intake.   -RUQ ultrasound with fatty liver otherwise normal.   -Normal HIDA 05/2018.  -No improvement with Gas-X or high dose PPI. -No response to trial of Elavil.   -EGD in 02/2018 otherwise normal to include normal gastric and duodenal biopsies.  2) IBS mixed type: Long-standing history ofb/lLQ cramping, spasm type pain, with associated bloating and alternating diarrhea/constipation. No associated hematochezia or melena. Admittedly, with a history of stress, depression, anxiety which seems to affect the symptoms.   -Colonoscopy with hyperemia in transverse through sigmoid colon, with normal biopsies.  Random biopsies otherwise negative for MC. 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 Leighton Ruff.  - 15/4008: "Abscess" starts - 12/2015: First surgery - 12/2016: LIFT surgery - 03/2017: LIFT surgery #2 - 06/2017: Fistulotomy Minor incontinence of stool since last fistulotomy.  4) GERD with erosive esophagitis: LA grade a esophagitis on EGD in 02/2017.  Reflux symptoms well controlled on twice daily PPI.  Reduced to Protonix 40 mg/day, but with breakthrough.  Now controlled with Prilosec 20 mg.   Endoscopic  history: - EGD (02/06/2018, Dr. Bryan Lemma): LA grade a esophagitis, felinization longitudinal furrows in the esophagus (biopsy: 14 eos and distal/6 eos and proximal esophagus), normal stomach (biopsy non-H. pylori gastritis), normal duodenum (biopsies without celiac) -Colonoscopy (02/06/2018, Dr. Bryan Lemma): Perianal exam notable for well-healed scar from prior surgical intervention, 3 mm hyperplastic polyp in transverse colon, 5 mm inflammatory polyp in rectum, normal mucosa in the cecum through proximal transverse colon, but reduced vascular pattern in the distal transverse colon through rectum. Biopsies were otherwise normal, without inflammatory changes or Microscopic Colitis, normal terminal ileum  HPI:     Patient is a 34 y.o. female presenting to the [facility] for follow-up.  Last seen by me on 05/24/2018.  Has had extensive evaluation for multiple GI symptoms as outlined above.  Trialed Effexor 25 mg qhs but had reported ADR (drowsiness,) so this was changed by PCM to nortriptyline 25 mg.  Apriso stopped.  Given breakthrough reflux on Protonix, changed to Nexium 40 mg/day, which was subsequently changed to Prilosec with good control.  Today, states still with alternating bowel habits between constipation/diarrhea. "feel everything in my abdomen". Generalized abdominal pain, but pain more in MEG/upper abdomen.   Was seen by Dr. Nani Ravens last week and increased Nortiptyline to 50 mg qhs last week with some improvement and increased Bentyl 10 mg --> 20 mg with good response.  Has modified diet, but no appreciable improvement and has actually gained weight.  Asking about FDGuard/IBGuard.   11/2019: Normal CBC, iron indices, ferritin, CMP (AST 33)    Review of systems:     No chest pain, no SOB, no fevers, no urinary sx   Past Medical History:  Diagnosis Date  . Anxiety   . Chronic headaches   .  Depression   . Fistula-in-ano   . GERD (gastroesophageal reflux disease)     occ  . 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 Migraine   . Numbness and tingling of left leg    cold sensation, painful    Patient's surgical history, family medical history, social history, medications and allergies were all reviewed in Epic    Current Outpatient Medications  Medication Sig Dispense Refill  . acetaminophen (TYLENOL) 500 MG tablet Take 500 mg by mouth every 6 (six) hours as needed for mild pain.    . Albuterol Sulfate 108 (90 Base) MCG/ACT AEPB Inhale 1-2 puffs into the lungs every 6 (six) hours as needed (sob). 1 each 1  . dicyclomine (BENTYL) 20 MG tablet Take 1 tablet (20 mg total) by mouth 4 (four) times daily -  before meals and at bedtime. 90 tablet 3  . medroxyPROGESTERone (DEPO-PROVERA) 150 MG/ML injection Inject 1 mL (150 mg total) into the muscle every 3 (three) months. 1 mL 5  . nortriptyline (PAMELOR) 50 MG capsule Take 1 capsule (50 mg total) by mouth at bedtime. 30 capsule 3  . omeprazole (PRILOSEC) 20 MG capsule Take 1 capsule by mouth once daily 30 capsule 0  . valACYclovir (VALTREX) 1000 MG tablet Take 1,000 mg by mouth daily.     Current Facility-Administered Medications  Medication Dose Route Frequency Provider Last Rate Last Admin  . medroxyPROGESTERone (DEPO-PROVERA) injection 150 mg  150 mg Intramuscular Q90 days Shelly Bombard, MD   150 mg at 09/30/19 0150    Physical Exam:     BP 110/72   Pulse (!) 108   Ht 5\' 2"  (1.575 m)   Wt 246 lb 2 oz (111.6 kg)   BMI 45.02 kg/m   GENERAL:  Pleasant female in NAD PSYCH: : Cooperative, normal affect EENT:  conjunctiva pink, mucous membranes moist, neck supple without masses CARDIAC:  RRR, no murmur heard, no peripheral edema PULM: Normal respiratory effort, lungs CTA bilaterally, no wheezing ABDOMEN:  Nondistended, soft, nontender. No obvious masses, no hepatomegaly,  normal bowel sounds SKIN:  turgor, no lesions seen Musculoskeletal:  Normal muscle tone, normal strength NEURO: Alert  and oriented x 3, no focal neurologic deficits   IMPRESSION and PLAN:    1) IBS-mixed type 2) Generalized abdominal pain/Upper abdominal pain 3 Nausea/Vomiting  -Will monitor for improvement since she is on dose of nortriptyline -Continue nortriptyline at current dose -Agree with Dr. Irene Limbo recommendation of trial of l-carnitine/co-Q10 -Continue Bentyl -May consider trial of glutamine -Will trial course of FD guard.  If no significant clinical improvement, change to trial course of IBgard. -An alternative/concomitant diagnosis could be Congenital Sucrase Isomaltase Deficiency (CSID).  This is a relatively uncommon etiology, but very similar symptomatology.  Diagnosis can be difficult, requiring either small bowel biopsies to evaluate for disaccharidase activity (gold standard, not readily available) or sucrose breath test.  Alternatively, could consider trial course of Sucraid for diagnostic/therapeutic intent (can be obtained via [facility] specialty pharmacy which checks the medication)  4) GERD -Well-controlled since cut back on Prilosec -Continue antireflux lifestyle/dietary modifications, avoidance of offending foods   RTC in 6 months or sooner as needed  I spent 32 minutes of time, including in depth chart review, independent review of results as outlined above, communicating results with the patient directly, face-to-face time with the patient, coordinating care, and ordering studies and medications as appropriate, and documentation.  Lavena Bullion ,DO, FACG 12/03/2019, 11:46 AM

## 2019-12-10 MED ORDER — VITAMIN D (ERGOCALCIFEROL) 1.25 MG (50000 UNIT) PO CAPS: 50000.0000 [IU] | ORAL_CAPSULE | ORAL | 0 refills | Status: DC

## 2019-12-12 ENCOUNTER — Other Ambulatory Visit: Payer: Self-pay

## 2019-12-12 ENCOUNTER — Other Ambulatory Visit: Payer: 59

## 2019-12-12 DIAGNOSIS — R945 Abnormal results of liver function studies: Secondary | ICD-10-CM

## 2019-12-12 DIAGNOSIS — E559 Vitamin D deficiency, unspecified: Secondary | ICD-10-CM

## 2019-12-13 LAB — HEPATIC FUNCTION PANEL
AG Ratio: 1.4 (calc) (ref 1.0–2.5)
ALT: 31 U/L — ABNORMAL HIGH (ref 6–29)
AST: 35 U/L — ABNORMAL HIGH (ref 10–30)
Albumin: 4 g/dL (ref 3.6–5.1)
Alkaline phosphatase (APISO): 45 U/L (ref 31–125)
Bilirubin, Direct: 0.1 mg/dL (ref 0.0–0.2)
Globulin: 2.9 g/dL (calc) (ref 1.9–3.7)
Indirect Bilirubin: 0.5 mg/dL (calc) (ref 0.2–1.2)
Total Bilirubin: 0.6 mg/dL (ref 0.2–1.2)
Total Protein: 6.9 g/dL (ref 6.1–8.1)

## 2019-12-13 LAB — VITAMIN D 25 HYDROXY (VIT D DEFICIENCY, FRACTURES): Vit D, 25-Hydroxy: 18 ng/mL — ABNORMAL LOW (ref 30–100)

## 2019-12-23 ENCOUNTER — Ambulatory Visit: Payer: 59

## 2019-12-30 ENCOUNTER — Other Ambulatory Visit: Payer: Self-pay

## 2019-12-30 ENCOUNTER — Ambulatory Visit (INDEPENDENT_AMBULATORY_CARE_PROVIDER_SITE_OTHER): Payer: 59

## 2019-12-30 VITALS — BP 129/84 | HR 102 | Wt 250.0 lb

## 2019-12-30 DIAGNOSIS — Z3042 Encounter for surveillance of injectable contraceptive: Secondary | ICD-10-CM | POA: Diagnosis not present

## 2019-12-30 MED ORDER — MEDROXYPROGESTERONE ACETATE 150 MG/ML IM SUSP
150.0000 mg | Freq: Once | INTRAMUSCULAR | Status: AC
  Administered 2019-12-30: 150 mg via INTRAMUSCULAR

## 2019-12-30 NOTE — Progress Notes (Signed)
34 y.o. GYN presents for DEPO Injection, given in LD, tolerated well.  Last PAP 03/17/2019  Next DEPO Dec. 14-28, 2021  Administrations This Visit    medroxyPROGESTERone (DEPO-PROVERA) injection 150 mg    Admin Date 12/30/2019 Action Given Dose 150 mg Route Intramuscular Administered By Tamela Oddi, RMA

## 2019-12-30 NOTE — Progress Notes (Signed)
I have reviewed the chart and agree with the nurse's note and plan of care for this visit.   Fatima Blank, CNM 5:33 PM

## 2020-01-07 ENCOUNTER — Ambulatory Visit: Payer: 59 | Admitting: Family Medicine

## 2020-01-07 ENCOUNTER — Ambulatory Visit: Payer: 59 | Admitting: Gastroenterology

## 2020-01-07 ENCOUNTER — Other Ambulatory Visit: Payer: Self-pay

## 2020-01-12 ENCOUNTER — Other Ambulatory Visit: Payer: Self-pay

## 2020-01-12 ENCOUNTER — Encounter: Payer: Self-pay | Admitting: Family Medicine

## 2020-01-12 ENCOUNTER — Ambulatory Visit: Payer: 59 | Admitting: Family Medicine

## 2020-01-12 VITALS — BP 134/86 | HR 106 | Temp 98.0°F | Ht 62.0 in | Wt 250.4 lb

## 2020-01-12 DIAGNOSIS — R109 Unspecified abdominal pain: Secondary | ICD-10-CM | POA: Diagnosis not present

## 2020-01-12 DIAGNOSIS — Z23 Encounter for immunization: Secondary | ICD-10-CM | POA: Diagnosis not present

## 2020-01-12 DIAGNOSIS — R5383 Other fatigue: Secondary | ICD-10-CM | POA: Diagnosis not present

## 2020-01-12 DIAGNOSIS — K589 Irritable bowel syndrome without diarrhea: Secondary | ICD-10-CM

## 2020-01-12 DIAGNOSIS — G8929 Other chronic pain: Secondary | ICD-10-CM

## 2020-01-12 MED ORDER — NORTRIPTYLINE HCL 50 MG PO CAPS: 50.0000 mg | ORAL_CAPSULE | Freq: Every day | ORAL | 3 refills | Status: DC

## 2020-01-12 MED ORDER — BUPROPION HCL ER (XL) 150 MG PO TB24: 150.0000 mg | ORAL_TABLET | Freq: Every day | ORAL | 3 refills | Status: DC

## 2020-01-12 MED ORDER — OMEPRAZOLE 20 MG PO CPDR: 20.0000 mg | DELAYED_RELEASE_CAPSULE | Freq: Every day | ORAL | 3 refills | Status: DC

## 2020-01-12 NOTE — Patient Instructions (Signed)
Continue the Pamelor.  Let me know if there are cost issues with the Wellbutrin.  Please consider counseling. Contact [phone] to schedule an appointment or inquire about cost/insurance coverage.  Keep the diet clean and stay active.  Let us know if you need anything.

## 2020-01-12 NOTE — Progress Notes (Signed)
Chief Complaint  Patient presents with  . Follow-up    medication    Subjective: Patient is a 34 y.o. female here for fu IBS.  The patient had her Pamelor increased from 25 mg nightly to 50 mg nightly.  She noticed a great improvement with her abdominal issues.  Unfortunately, she started to feel more tired and depressed.  She is tolerating well otherwise.  She has failed Effexor 25 mg twice daily and Elavil in the past.  She was given samples of IB guard which she has not routinely taken yet.  She is still having joint pain.  Past Medical History:  Diagnosis Date  . Anxiety   . Chronic headaches   . Depression   . Fistula-in-ano   . GERD (gastroesophageal reflux disease)    occ  . 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 Migraine   . Numbness and tingling of left leg    cold sensation, painful    Objective: BP 134/86 (BP Location: Left Arm, Patient Position: Sitting, Cuff Size: Large)   Pulse (!) 106   Temp 98 F (36.7 C) (Oral)   Ht 5\' 2"  (1.575 m)   Wt 250 lb 6 oz (113.6 kg)   SpO2 96%   BMI 45.79 kg/m  General: Awake, appears stated age HEENT: MMM, EOMi Heart: RRR Lungs: CTAB, no rales, wheezes or rhonchi. No accessory muscle use Abdomen: Bowel sounds are present, soft, tender to palpation in the epigastric and left lower region.  No masses or organomegaly Psych: Age appropriate judgment and insight, normal affect and mood  Assessment and Plan: Irritable bowel syndrome, unspecified type - Plan: nortriptyline (PAMELOR) 50 MG capsule, buPROPion (WELLBUTRIN XL) 150 MG 24 hr tablet  Chronic abdominal pain - Plan: omeprazole (PRILOSEC) 20 MG capsule, nortriptyline (PAMELOR) 50 MG capsule  Fatigue, unspecified type  Need for influenza vaccination - Plan: Flu Vaccine QUAD 6+ mos PF IM (Fluarix Quad PF)  1.  Continue Pamelor 50 mg daily as this has been helpful.  Continue omeprazole 20 mg daily.  Will trial Wellbutrin XL 150 mg daily. [facility] info provided as  well. 2. As above. 3.  Side effect of medication.  He does not want to stop it due to improvement in other areas. Follow-up in 6 weeks. The patient voiced understanding and agreement to the plan.  [place], DO 01/12/20  4:53 PM

## 2020-02-02 DIAGNOSIS — U071 COVID-19: Secondary | ICD-10-CM | POA: Insufficient documentation

## 2020-02-02 HISTORY — DX: COVID-19: U07.1

## 2020-02-07 DIAGNOSIS — Z8616 Personal history of COVID-19: Secondary | ICD-10-CM

## 2020-02-07 HISTORY — DX: Personal history of COVID-19: Z86.16

## 2020-02-09 ENCOUNTER — Other Ambulatory Visit: Payer: Self-pay

## 2020-02-09 ENCOUNTER — Telehealth (INDEPENDENT_AMBULATORY_CARE_PROVIDER_SITE_OTHER): Payer: 59 | Admitting: Family Medicine

## 2020-02-09 ENCOUNTER — Encounter: Payer: Self-pay | Admitting: Family Medicine

## 2020-02-09 VITALS — Temp 98.2°F

## 2020-02-09 DIAGNOSIS — U071 COVID-19: Secondary | ICD-10-CM | POA: Diagnosis not present

## 2020-02-09 NOTE — Progress Notes (Signed)
Chief Complaint  Patient presents with  . Covid Positive    tested positive on Saturday  . Generalized Body Aches    head congestion  . Fatigue    Renato Gails Padmore here for URI complaints. Due to COVID-19 pandemic, we are interacting via web portal for an electronic face-to-face visit. I verified patient's ID using 2 identifiers. Patient agreed to proceed with visit via this method. Patient is at home, I am at office. Patient and I are present for visit.   Duration: 6 days  Associated symptoms: fatigue, loss of taste/smell, slight cough, low grade fevers Denies: sinus congestion, sinus pain, rhinorrhea, itchy watery eyes, ear pain, ear drainage, sore throat, wheezing, shortness of breath and myalgia Treatment to date: Ibuprofen, Coricidin.  Sick contacts: Yes- father of her child has covid She tested positive last week  Past Medical History:  Diagnosis Date  . Anxiety   . Chronic headaches   . Depression   . Fistula-in-ano   . GERD (gastroesophageal reflux disease)    occ  . 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 Migraine   . Numbness and tingling of left leg    cold sensation, painful    Temp 98.2 F (36.8 C) (Oral)  No conversational dyspnea Age appropriate judgment and insight Nml affect and mood  COVID-19  Will see if she qualifies for infusion.  Continue to push fluids, practice good hand hygiene, cover mouth when coughing. F/u prn. If starting to experience irreplaceable fluid loss or shortness of breath, seek immediate care. Pt voiced understanding and agreement to the plan.  [place], DO 02/09/20 2:37 PM

## 2020-02-10 ENCOUNTER — Encounter: Payer: Self-pay | Admitting: Nurse Practitioner

## 2020-02-10 ENCOUNTER — Telehealth: Payer: Self-pay | Admitting: Nurse Practitioner

## 2020-02-10 NOTE — Telephone Encounter (Signed)
Called patient to discuss Covid symptoms and the use of casirivimab/imdevimab, a monoclonal antibody infusion for those with mild to moderate Covid symptoms and at a high risk of hospitalization.  Pt is qualified for this infusion at the [facility] infusion center due to; Specific high risk criteria : BMI > 25.  Message left to call back our hotline [phone].  Murray Hodgkins, NP

## 2020-02-11 ENCOUNTER — Other Ambulatory Visit: Payer: Self-pay | Admitting: Nurse Practitioner

## 2020-02-11 DIAGNOSIS — Z6841 Body Mass Index (BMI) 40.0 and over, adult: Secondary | ICD-10-CM

## 2020-02-11 DIAGNOSIS — U071 COVID-19: Secondary | ICD-10-CM

## 2020-02-11 NOTE — Progress Notes (Signed)
I connected by phone with [name] [name] on 02/11/2020 at 4:31 PM to discuss the potential use of a new treatment for mild to moderate COVID-19 viral infection in non-hospitalized patients.  This patient is a 34 y.o. female that meets the FDA criteria for Emergency Use Authorization of COVID monoclonal antibody casirivimab/imdevimab, bamlanivimab/eteseviamb, or sotrovimab.  Has a (+) direct SARS-CoV-2 viral test result  Has mild or moderate COVID-19   Is NOT hospitalized due to COVID-19  Is within 10 days of symptom onset  Has at least one of the high risk factor(s) for progression to severe COVID-19 and/or hospitalization as defined in EUA.  Specific high risk criteria : BMI > 25   I have spoken and communicated the following to the patient or parent/caregiver regarding COVID monoclonal antibody treatment:  1. FDA has authorized the emergency use for the treatment of mild to moderate COVID-19 in adults and pediatric patients with positive results of direct SARS-CoV-2 viral testing who are 12 years of age and older weighing at least 40 kg, and who are at high risk for progressing to severe COVID-19 and/or hospitalization.  2. The significant known and potential risks and benefits of COVID monoclonal antibody, and the extent to which such potential risks and benefits are unknown.  3. Information on available alternative treatments and the risks and benefits of those alternatives, including clinical trials.  4. Patients treated with COVID monoclonal antibody should continue to self-isolate and use infection control measures (e.g., wear mask, isolate, social distance, avoid sharing personal items, clean and disinfect "high touch" surfaces, and frequent handwashing) according to CDC guidelines.   5. The patient or parent/caregiver has the option to accept or refuse COVID monoclonal antibody treatment.  After reviewing this information with the patient, the patient has agreed to receive one of  the available covid 19 monoclonal antibodies and will be provided an appropriate fact sheet prior to infusion. Fenton Foy, NP 02/11/2020 4:31 PM

## 2020-02-12 ENCOUNTER — Ambulatory Visit (HOSPITAL_COMMUNITY)
Admission: RE | Admit: 2020-02-12 | Discharge: 2020-02-12 | Disposition: A | Discharge status: Home / Self Care | Payer: 59 | Source: Ambulatory Visit | Attending: Pulmonary Disease | Admitting: Pulmonary Disease

## 2020-02-12 DIAGNOSIS — U071 COVID-19: Secondary | ICD-10-CM | POA: Insufficient documentation

## 2020-02-12 DIAGNOSIS — Z6841 Body Mass Index (BMI) 40.0 and over, adult: Secondary | ICD-10-CM | POA: Insufficient documentation

## 2020-02-12 MED ORDER — EPINEPHRINE 0.3 MG/0.3ML IJ SOAJ: 0.3000 mg | Freq: Once | INTRAMUSCULAR | Status: DC | PRN

## 2020-02-12 MED ORDER — DIPHENHYDRAMINE HCL 50 MG/ML IJ SOLN: 50.0000 mg | Freq: Once | INTRAMUSCULAR | Status: DC | PRN

## 2020-02-12 MED ORDER — SODIUM CHLORIDE 0.9 % IV SOLN: INTRAVENOUS | Status: DC | PRN

## 2020-02-12 MED ORDER — METHYLPREDNISOLONE SODIUM SUCC 125 MG IJ SOLR: 125.0000 mg | Freq: Once | INTRAMUSCULAR | Status: DC | PRN

## 2020-02-12 MED ORDER — ALBUTEROL SULFATE HFA 108 (90 BASE) MCG/ACT IN AERS: 2.0000 | INHALATION_SPRAY | Freq: Once | RESPIRATORY_TRACT | Status: DC | PRN

## 2020-02-12 MED ORDER — SOTROVIMAB 500 MG/8ML IV SOLN
500.0000 mg | Freq: Once | INTRAVENOUS | Status: AC
  Administered 2020-02-12: 500 mg via INTRAVENOUS

## 2020-02-12 MED ORDER — FAMOTIDINE IN NACL 20-0.9 MG/50ML-% IV SOLN: 20.0000 mg | Freq: Once | INTRAVENOUS | Status: DC | PRN

## 2020-02-12 NOTE — Progress Notes (Signed)
  Diagnosis: COVID-19  Physician: Asencion Noble, MD  Procedure: Sotrovimab Infusion   Complications: No immediate complications noted.  Discharge: Discharged home   [name] [name] 02/12/2020

## 2020-02-12 NOTE — Discharge Instructions (Signed)

## 2020-02-20 ENCOUNTER — Other Ambulatory Visit: Payer: 59

## 2020-02-21 IMAGING — NM NM HEPATO W/GB/PHARM/[PERSON_NAME]
2 series · 12 of 12 positions shown · non-contrast
Comparison: None.

CLINICAL DATA: Right upper quadrant pain

EXAM:
NUCLEAR MEDICINE HEPATOBILIARY IMAGING WITH GALLBLADDER EF
VIEWS:
Anterior right upper quadrant
RADIOPHARMACEUTICALS:  5.2 mCi 4c-MMm  Choletec IV

[Series 1: raw data · 4.46mm/px · 6 of 60 frames shown (1 of 2)]
[frame 6/60]
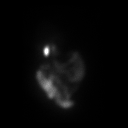
[frame 16/60]
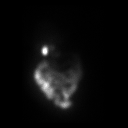
[frame 26/60]
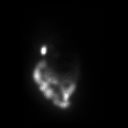
[frame 36/60]
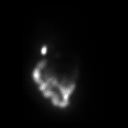
[frame 46/60]
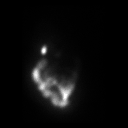
[frame 56/60]
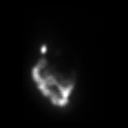

[Series 1: raw data · 4.46mm/px · 6 of 60 frames shown (2 of 2)]
[frame 6/60]
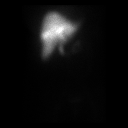
[frame 16/60]
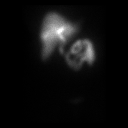
[frame 26/60]
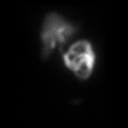
[frame 36/60]
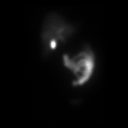
[frame 46/60]
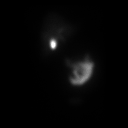
[frame 56/60]
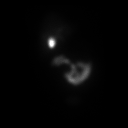

[12 of 12 positions shown; findings below may reference images not displayed]

FINDINGS: Liver uptake of radiotracer is unremarkable. There is prompt
visualization of gallbladder and small bowel, indicating patency of
the cystic and common bile ducts. Patient consumed 8 ounces of
Ensure orally with calculation of the computer generated ejection
fraction of radiotracer from the gallbladder. The patient did
experience pain with the oral Ensure consumption. The computer
generated ejection fraction of radiotracer from the gallbladder is
normal at 54%, normal greater than 33% using the oral agent.
IMPRESSION: Normal ejection fraction of radiotracer from the gallbladder.
Patient experience pain with the oral Ensure consumption. Cystic and
common bile ducts are patent as is evidenced by visualization of
gallbladder and small bowel.

## 2020-03-04 ENCOUNTER — Other Ambulatory Visit: Payer: Self-pay

## 2020-03-04 ENCOUNTER — Encounter: Payer: Self-pay | Admitting: Family Medicine

## 2020-03-04 ENCOUNTER — Ambulatory Visit: Payer: 59 | Admitting: Family Medicine

## 2020-03-04 VITALS — BP 128/76 | HR 111 | Temp 97.5°F | Resp 15 | Wt 246.8 lb

## 2020-03-04 DIAGNOSIS — E559 Vitamin D deficiency, unspecified: Secondary | ICD-10-CM | POA: Diagnosis not present

## 2020-03-04 DIAGNOSIS — G8929 Other chronic pain: Secondary | ICD-10-CM | POA: Diagnosis not present

## 2020-03-04 DIAGNOSIS — K589 Irritable bowel syndrome without diarrhea: Secondary | ICD-10-CM

## 2020-03-04 DIAGNOSIS — R109 Unspecified abdominal pain: Secondary | ICD-10-CM

## 2020-03-04 MED ORDER — PREGABALIN 75 MG PO CAPS: 75.0000 mg | ORAL_CAPSULE | Freq: Two times a day (BID) | ORAL | 2 refills | Status: DC

## 2020-03-04 NOTE — Patient Instructions (Signed)
Aim to do some physical exertion for 150 minutes per week. This is typically divided into 5 days per week, 30 minutes per day. The activity should be enough to get your heart rate up. Anything is better than nothing if you have time constraints.  Continue your current supplements.   Let us know if you need anything.

## 2020-03-04 NOTE — Progress Notes (Signed)
Chief Complaint  Patient presents with  . Follow-up    Subjective: Patient is a 34 y.o. female here for f/u IBS.  Currently on Pamelor 50 mg/d. Started on Wellbutrin, stopped as she got covid. Still having abd issues, but started to have worsening fatigue and myalgias. She has had diffuse jt and muscle pain for nearly a year now. She takes melatonin 3 mg/night. Started on L carnitine and CoQ10 but stopped that due to covid also. No worsening issues though.   Past Medical History:  Diagnosis Date  . Anxiety   . Chronic headaches   . COVID-19 virus infection 02/2020  . Depression   . Fistula-in-ano   . GERD (gastroesophageal reflux disease)    occ  . 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 Migraine   . Morbid obesity ([place])   . Numbness and tingling of left leg    cold sensation, painful    Objective: BP 128/76 (BP Location: Right Arm, Patient Position: Sitting, Cuff Size: Large)   Pulse (!) 111   Temp (!) 97.5 F (36.4 C) (Temporal)   Resp 15   Wt 246 lb 12.8 oz (111.9 kg)   SpO2 97%   BMI 45.14 kg/m  General: Awake, appears stated age HEENT: MMM, EOMi Heart: RRR (HR around 84 on my exam) Abd: BS+, S, diffuse TTP, nondistended MSK: +TTp over b/l back Lungs: CTAB, no rales, wheezes or rhonchi. No accessory muscle use Psych: Age appropriate judgment and insight, normal affect and mood  Assessment and Plan: Irritable bowel syndrome, unspecified type - Plan: pregabalin (LYRICA) 75 MG capsule  Chronic abdominal pain - Plan: pregabalin (LYRICA) 75 MG capsule  Vitamin D deficiency - Plan: VITAMIN D 25 Hydroxy (Vit-D Deficiency, Fractures)  We will stay off of the Wellbutrin for now in addn to supps. Start Lyrica 75 mg bid. I think this could help with post covid s/s's, a likely FM component in addition to her IBS.  F/u in 1 mo. The patient voiced understanding and agreement to the plan.  [place], DO 03/04/20  9:37 AM

## 2020-03-05 ENCOUNTER — Ambulatory Visit: Payer: 59 | Admitting: Family Medicine

## 2020-03-23 ENCOUNTER — Ambulatory Visit: Payer: 59

## 2020-04-09 ENCOUNTER — Ambulatory Visit: Payer: 59 | Admitting: Family Medicine

## 2020-04-09 ENCOUNTER — Other Ambulatory Visit: Payer: 59

## 2020-04-09 ENCOUNTER — Other Ambulatory Visit: Payer: Self-pay

## 2020-04-09 ENCOUNTER — Encounter: Payer: Self-pay | Admitting: Family Medicine

## 2020-04-09 VITALS — BP 122/82 | HR 114 | Temp 98.1°F | Ht 62.0 in | Wt 241.0 lb

## 2020-04-09 DIAGNOSIS — R109 Unspecified abdominal pain: Secondary | ICD-10-CM | POA: Diagnosis not present

## 2020-04-09 DIAGNOSIS — E559 Vitamin D deficiency, unspecified: Secondary | ICD-10-CM

## 2020-04-09 DIAGNOSIS — G8929 Other chronic pain: Secondary | ICD-10-CM | POA: Diagnosis not present

## 2020-04-09 LAB — VITAMIN D 25 HYDROXY (VIT D DEFICIENCY, FRACTURES): VITD: 17.03 ng/mL — ABNORMAL LOW (ref 30.00–100.00)

## 2020-04-09 MED ORDER — OMEPRAZOLE 20 MG PO CPDR: 20.0000 mg | DELAYED_RELEASE_CAPSULE | Freq: Every day | ORAL | 3 refills | Status: DC

## 2020-04-09 NOTE — Progress Notes (Signed)
Chief Complaint  Patient presents with  . Follow-up    Subjective: Patient is a 35 y.o. female here for f/u IBS/chronic abd pain.  She was started on Lyrica 75 mg bid. She is also taking Pamelor 50 mg/d. Tolerating well, no AE's. Reports that she is doing 30-40% better. Still has pain but more functional. Does not wish to make a medication change at this time.   Past Medical History:  Diagnosis Date  . Anxiety   . Chronic headaches   . COVID-19 virus infection 02/2020  . Depression   . Fistula-in-ano   . GERD (gastroesophageal reflux disease)    occ  . 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 Migraine   . Morbid obesity ([place])   . Numbness and tingling of left leg    cold sensation, painful    Objective: BP 122/82 (BP Location: Left Arm, Patient Position: Sitting, Cuff Size: Large)   Pulse (!) 114   Temp 98.1 F (36.7 C) (Oral)   Ht 5\' 2"  (1.575 m)   Wt 241 lb (109.3 kg)   SpO2 96%   BMI 44.08 kg/m  General: Awake, appears stated age Abd: BS+, +diffuse ttp, no masses or organomegaly, ND Heart: RRR Lungs: CTAB, no rales, wheezes or rhonchi. No accessory muscle use Psych: Age appropriate judgment and insight, normal affect and mood  Assessment and Plan: Chronic abdominal pain - Plan: omeprazole (PRILOSEC) 20 MG capsule  Vitamin D deficiency - Plan: VITAMIN D 25 Hydroxy (Vit-D Deficiency, Fractures)  Cont Pamelor 50 mg/d and Lyrica 75 mg bid.  F/u in ~8 mo for CPE or prn.  The patient voiced understanding and agreement to the plan.  [place], DO 04/09/20  9:26 AM

## 2020-04-09 NOTE — Patient Instructions (Addendum)
Let me know if you need refills.   Stay active.  Try to drink 55-60 oz of water daily outside of exercise.  Give [place] 2-3 business days to get the results of your labs back.

## 2020-04-12 ENCOUNTER — Other Ambulatory Visit: Payer: Self-pay | Admitting: Family Medicine

## 2020-04-12 DIAGNOSIS — E559 Vitamin D deficiency, unspecified: Secondary | ICD-10-CM

## 2020-04-12 MED ORDER — VITAMIN D (ERGOCALCIFEROL) 1.25 MG (50000 UNIT) PO CAPS
50000.0000 [IU] | ORAL_CAPSULE | ORAL | 0 refills | Status: DC
Start: 2020-04-12 — End: 2020-11-26

## 2020-06-04 ENCOUNTER — Other Ambulatory Visit: Payer: Self-pay

## 2020-06-04 ENCOUNTER — Ambulatory Visit: Payer: 59 | Admitting: Family Medicine

## 2020-06-04 ENCOUNTER — Encounter: Payer: Self-pay | Admitting: Family Medicine

## 2020-06-04 VITALS — BP 126/84 | HR 87 | Temp 98.5°F | Resp 17 | Ht 62.0 in | Wt 252.0 lb

## 2020-06-04 DIAGNOSIS — G8929 Other chronic pain: Secondary | ICD-10-CM | POA: Diagnosis not present

## 2020-06-04 DIAGNOSIS — R109 Unspecified abdominal pain: Secondary | ICD-10-CM | POA: Diagnosis not present

## 2020-06-04 DIAGNOSIS — K5909 Other constipation: Secondary | ICD-10-CM

## 2020-06-04 MED ORDER — LUBIPROSTONE 8 MCG PO CAPS: 8.0000 ug | ORAL_CAPSULE | Freq: Two times a day (BID) | ORAL | 2 refills | Status: DC

## 2020-06-04 NOTE — Progress Notes (Signed)
Chief Complaint  Patient presents with  . Constipation    Nausea, constipation, abdominal pain, using dulcolax supp. Extremely painful, back pain    Subjective: Patient is a 35 y.o. female here for constipation.  Over the past 2 weeks, the patient has had very sparse bowel movements.  She is no longer passing gas and only small amounts of stool.  She is nauseous but not vomiting.  She is not having any bleeding.  She is taking a generic version of Colace and has tried a suppository without relief.  The pain is getting severe.  In the past, she has failed Colace, MiraLAX, Milk of Mag, Dulcolax, suppositories, various fiber supplements. She drinks at least 1/2 gallon of water daily.    Past Medical History:  Diagnosis Date  . Anxiety   . Chronic headaches   . COVID-19 virus infection 02/2020  . Depression   . Fistula-in-ano   . GERD (gastroesophageal reflux disease)    occ  . 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 Migraine   . Morbid obesity ([place])   . Numbness and tingling of left leg    cold sensation, painful    Objective: BP 126/84 (BP Location: Left Arm, Patient Position: Sitting, Cuff Size: Large)   Pulse 87   Temp 98.5 F (36.9 C)   Resp 17   Ht 5\' 2"  (1.575 m)   Wt 252 lb (114.3 kg)   SpO2 97%   BMI 46.09 kg/m  General: Awake, appears stated age HEENT: MMM, EOMi Heart: RRR, no murmurs Lungs: CTAB, no rales, wheezes or rhonchi. No accessory muscle use Abdomen: Bowel sounds present and not high-pitched/tinkling.  She is diffusely tender to palpation.  Mildly distended.  No masses or organomegaly. Psych: Age appropriate judgment and insight, normal affect and mood; she did become tearful during the exam  Assessment and Plan: Chronic constipation - Plan: lubiprostone (AMITIZA) 8 MCG capsule, Ambulatory referral to Gastroenterology  Chronic abdominal pain - Plan: Ambulatory referral to Gastroenterology  1.  Start Amitiza to help regulate her chronic  constipation issues.  She has failed fiber supplements, MiraLAX, Colace, Dulcolax, senna, milk of magnesia, magnesium citrate, suppositories, and if she is going to try an enema for her current issue.  I would like her to take both Dulcolax and MiraLAX in combination as well.  If she starts vomiting stool, she will go to the emergency department.  If symptoms worsen she will also go to the emergency department. 2.  We will refer the patient to Dr. Watt Climes at her request. Follow-up as originally scheduled. The patient voiced understanding and agreement to the plan.  [place], DO 06/04/20  12:08 PM

## 2020-06-04 NOTE — Patient Instructions (Addendum)
Stay hydrated.  Try a soap suds enema or mineral oil enema. This is available over the counter. Try to avoid Fleet's.   Start using both MiraLAX (osmotic laxative) and Dulcolax (stimulant).   If you do not hear anything about your referral in the next 1-2 weeks, call our office and ask for an update.  Keep moving.   Let us know if you need anything.

## 2020-06-08 ENCOUNTER — Telehealth: Payer: Self-pay | Admitting: *Deleted

## 2020-06-08 ENCOUNTER — Other Ambulatory Visit: Payer: Self-pay | Admitting: Family Medicine

## 2020-06-08 MED ORDER — LINACLOTIDE 145 MCG PO CAPS: 145.0000 ug | ORAL_CAPSULE | Freq: Every day | ORAL | 2 refills | Status: DC

## 2020-06-08 NOTE — Telephone Encounter (Signed)
Prior auth for the Amitiza was denied.  The request for coverage for Amitiza 61mcg capsule, use as directed (60 capsules per month), is denied. This decision is based on health plan criteria for Amitiza. This medicine is covered only if: All of the following: (A) You have a history of failure, contraindication, or intolerance to Linzess*. (B) You have a history of failure, contraindication, or intolerance to lubiprostone* (Amitiza authorized brand alternative). The information provided does not show that you meet the criteria listed above.

## 2020-07-12 ENCOUNTER — Other Ambulatory Visit: Payer: 59

## 2020-08-11 ENCOUNTER — Other Ambulatory Visit: Payer: Self-pay

## 2020-08-11 ENCOUNTER — Encounter (HOSPITAL_COMMUNITY): Payer: Self-pay | Admitting: *Deleted

## 2020-08-11 ENCOUNTER — Ambulatory Visit (HOSPITAL_COMMUNITY)
Admission: EM | Admit: 2020-08-11 | Discharge: 2020-08-11 | Disposition: A | Discharge status: Home / Self Care | Payer: 59 | Attending: Medical Oncology | Admitting: Medical Oncology

## 2020-08-11 DIAGNOSIS — G5602 Carpal tunnel syndrome, left upper limb: Secondary | ICD-10-CM

## 2020-08-11 MED ORDER — METHYLPREDNISOLONE ACETATE 40 MG/ML IJ SUSP
INTRAMUSCULAR | Status: AC
  Filled 2020-08-11: qty 1

## 2020-08-11 MED ORDER — METHYLPREDNISOLONE ACETATE 40 MG/ML IJ SUSP
40.0000 mg | Freq: Once | INTRAMUSCULAR | Status: AC
  Administered 2020-08-11: 40 mg via INTRAMUSCULAR

## 2020-08-11 NOTE — ED Triage Notes (Signed)
Pt reports numbness and pain in Lt thumb that radiates up arm

## 2020-08-11 NOTE — ED Provider Notes (Addendum)
[place]    CSN: [id number] Arrival date & time: 08/11/20  1735      History   Chief Complaint Chief Complaint  Patient presents with  . thumb pain    HPI [name] [name] is a 35 y.o. female.   HPI   Thumb Pain: Pt reports left thumb pain and numbness that radiates up the arm. Worse with making a grip or with flexion and extension movements of the wrist. No known injury but does work in [employer] and believes this aggravated her symptoms. She has tried tylenol for symptoms with some relief. No edema, skin color changes, warmth changes of skin.    Past Medical History:  Diagnosis Date  . Anxiety   . Chronic headaches   . COVID-19 virus infection 02/2020  . Depression   . Fistula-in-ano   . GERD (gastroesophageal reflux disease)    occ  . 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 Migraine   . Morbid obesity ([place])   . Numbness and tingling of left leg    cold sensation, painful    Patient Active Problem List   Diagnosis Date Noted  . Chronic constipation 06/04/2020  . Morbid obesity ([place])   . COVID-19 virus infection 02/2020  . Irritable bowel syndrome 01/12/2020  . Seasonal allergies 07/15/2018  . Chronic abdominal pain 01/16/2018  . Perianal pain 12/21/2016  . Moderate single current episode of major depressive disorder ([place]) 03/02/2016  . GERD without esophagitis 07/22/2013  . Allergic rhinitis, seasonal 07/22/2013  . Herpetic vulvovaginitis 06/26/2013  . Headache 06/26/2013  . Abnormal Pap smear of cervix   . Irregular periods     Past Surgical History:  Procedure Laterality Date  . FISTULOTOMY N/A 06/28/2017   Procedure: FISTULOTOMY;  Surgeon: Leighton Ruff, MD;  Location: [facility];  Service: General;  Laterality: N/A;  . FISTULOTOMY     says she has had 4 total  . INCISION AND DRAINAGE ABSCESS ANAL     two separate times in ED  . INCISION AND DRAINAGE PERIRECTAL ABSCESS Left 12/21/2016   Procedure: PLACEMENT OF  SETON INTO FISTULA  AND INCISION AND DRAINAGE OF PERIRECTAL ABCESS;  Surgeon: Kieth Brightly [name], MD;  Location: [facility];  Service: General;  Laterality: Left;  . IRRIGATION AND DEBRIDEMENT BUTTOCKS Left 12/28/2015   Procedure: IRRIGATION AND DEBRIDEMENT BUTTOCKS ABSCESS CHRONIC;  Surgeon: Erroll Luna, MD;  Location: [place];  Service: General;  Laterality: Left;  . LIGATION OF INTERNAL FISTULA TRACT N/A 03/08/2017   Procedure: LIGATION OF INTERNAL INTERSPHINCTERIC FISTULA TRACT;  Surgeon: Kieth Brightly, [name], MD;  Location: [place];  Service: General;  Laterality: N/A;    OB History    Gravida  1   Para  1   Term  1   Preterm      AB      Living  1     SAB      IAB      Ectopic      Multiple      Live Births  1            Home Medications    Prior to Admission medications   Medication Sig Start Date End Date Taking? Authorizing Provider  acetaminophen (TYLENOL) 500 MG tablet Take 500 mg by mouth every 6 (six) hours as needed for mild pain.    [provider]  Albuterol Sulfate 108 (90 Base) MCG/ACT AEPB Inhale 1-2  puffs into the lungs every 6 (six) hours as needed (sob). 07/15/18   [name], DO  dicyclomine (BENTYL) 20 MG tablet Take 1 tablet (20 mg total) by mouth 4 (four) times daily -  before meals and at bedtime. 11/26/19   [name], DO  linaclotide [facility]) 145 MCG CAPS capsule Take 1 capsule (145 mcg total) by mouth daily before breakfast. 06/08/20   Wendling, Crosby Oyster, DO  medroxyPROGESTERone (DEPO-PROVERA) 150 MG/ML injection Inject 1 mL (150 mg total) into the muscle every 3 (three) months. 04/15/19   Shelly Bombard, MD  nortriptyline (PAMELOR) 50 MG capsule Take 1 capsule (50 mg total) by mouth at bedtime. 01/12/20   [name], DO  omeprazole (PRILOSEC) 20 MG capsule Take 1 capsule (20 mg total) by mouth daily. 04/09/20   [name], DO  pregabalin  (LYRICA) 75 MG capsule Take 1 capsule (75 mg total) by mouth 2 (two) times daily. 03/04/20   [name], DO  valACYclovir (VALTREX) 1000 MG tablet Take 1,000 mg by mouth daily.    [provider]  Vitamin D, Ergocalciferol, (DRISDOL) 1.25 MG (50000 UNIT) CAPS capsule Take 1 capsule (50,000 Units total) by mouth every 7 (seven) days. 04/12/20   [name], DO    Family History Family History  Problem Relation Age of Onset  . Alcoholism Father   . Epilepsy Father   . Heart disease Father   . Breast cancer Maternal Aunt   . Colon cancer Neg Hx   . Esophageal cancer Neg Hx     Social History Social History   Tobacco Use  . Smoking status: Never Smoker  . Smokeless tobacco: Never Used  Vaping Use  . Vaping Use: Never used  Substance Use Topics  . Alcohol use: Not Currently  . Drug use: No     Allergies   Gabapentin, Cinnamon, and Hydrocodone   Review of Systems Review of Systems  As stated above in HPI Physical Exam Triage Vital Signs ED Triage Vitals  Enc Vitals Group     BP 08/11/20 1802 136/84     Pulse Rate 08/11/20 1802 88     Resp 08/11/20 1802 20     Temp 08/11/20 1802 98.4 F (36.9 C)     Temp Source 08/11/20 1802 Oral     SpO2 08/11/20 1802 100 %     Weight --      Height --      Head Circumference --      Peak Flow --      Pain Score 08/11/20 1759 6     Pain Loc --      Pain Edu? --      Excl. in [place]? --    No data found.  Updated Vital Signs BP 136/84   Pulse 88   Temp 98.4 F (36.9 C) (Oral)   Resp 20   LMP 07/18/2020   SpO2 100%   Physical Exam Vitals and nursing note reviewed.  Constitutional:      General: She is not in acute distress.    Appearance: Normal appearance. She is not ill-appearing, toxic-appearing or diaphoretic.  Cardiovascular:     Pulses: Normal pulses.  Musculoskeletal:        General: Tenderness present. No swelling, deformity or signs of injury. Normal range of motion.      Comments: Positive Tinel sign.   Skin:    General: Skin is warm.     Capillary Refill:  Capillary refill takes less than 2 seconds.     Coloration: Skin is not jaundiced or pale.     Findings: No bruising, erythema or rash.  Neurological:     Mental Status: She is alert.      UC Treatments / Results  Labs (all labs ordered are listed, but only abnormal results are displayed) Labs Reviewed - No data to display  EKG   Radiology No results found.  Procedures Procedures (including critical care time)  Medications Ordered in UC Medications - No data to display  Initial Impression / Assessment and Plan / UC Course  I have reviewed the triage vital signs and the nursing notes.  Pertinent labs & imaging results that were available during my care of the patient were reviewed by me and considered in my medical decision making (see chart for details).    New.  Appears to be carpal tunnel syndrome which is causing her some fairly significant discomfort.  We discussed more conservative and more aggressive treatment management.  At this time she has elected prednisone instead of NSAIDs as she does have a history of GERD.  We did discuss that the prednisone can worsen GERD but tends to offer less GERD irritation than NSAIDs.  We discussed how to use along with common potential side effects and precautions.  As she will likely not be able to start her steroids until Friday she asked for a steroid injection today.  She states that she is up-to-date on her period and not planning for pregnancy.  Final Clinical Impressions(s) / UC Diagnoses   Final diagnoses:  None   Discharge Instructions   None    ED Prescriptions    None     PDMP not reviewed this encounter.   Hughie Closs, Hershal Coria 08/11/20 1928    Hughie Closs, PA-C 08/11/20 1932

## 2020-11-25 ENCOUNTER — Other Ambulatory Visit: Payer: Self-pay

## 2020-11-26 ENCOUNTER — Other Ambulatory Visit: Payer: Self-pay | Admitting: Family Medicine

## 2020-11-26 ENCOUNTER — Encounter: Payer: Self-pay | Admitting: Family Medicine

## 2020-11-26 ENCOUNTER — Ambulatory Visit (INDEPENDENT_AMBULATORY_CARE_PROVIDER_SITE_OTHER): Payer: 59 | Admitting: Family Medicine

## 2020-11-26 VITALS — BP 112/78 | HR 92 | Temp 97.9°F | Ht 62.0 in | Wt 258.5 lb

## 2020-11-26 DIAGNOSIS — R109 Unspecified abdominal pain: Secondary | ICD-10-CM

## 2020-11-26 DIAGNOSIS — E559 Vitamin D deficiency, unspecified: Secondary | ICD-10-CM

## 2020-11-26 DIAGNOSIS — G8929 Other chronic pain: Secondary | ICD-10-CM

## 2020-11-26 DIAGNOSIS — F321 Major depressive disorder, single episode, moderate: Secondary | ICD-10-CM

## 2020-11-26 DIAGNOSIS — Z0000 Encounter for general adult medical examination without abnormal findings: Secondary | ICD-10-CM

## 2020-11-26 LAB — COMPREHENSIVE METABOLIC PANEL
ALT: 41 U/L — ABNORMAL HIGH (ref 0–35)
AST: 38 U/L — ABNORMAL HIGH (ref 0–37)
Albumin: 3.9 g/dL (ref 3.5–5.2)
Alkaline Phosphatase: 43 U/L (ref 39–117)
BUN: 10 mg/dL (ref 6–23)
CO2: 23 mEq/L (ref 19–32)
Calcium: 8.8 mg/dL (ref 8.4–10.5)
Chloride: 106 mEq/L (ref 96–112)
Creatinine, Ser: 0.69 mg/dL (ref 0.40–1.20)
GFR: 111.65 mL/min (ref >60.00)
Glucose, Bld: 97 mg/dL (ref 70–99)
Potassium: 4.2 mEq/L (ref 3.5–5.1)
Sodium: 138 mEq/L (ref 135–145)
Total Bilirubin: 0.8 mg/dL (ref 0.2–1.2)
Total Protein: 6.9 g/dL (ref 6.0–8.3)

## 2020-11-26 LAB — LIPID PANEL
Cholesterol: 166 mg/dL (ref 0–200)
HDL: 41.2 mg/dL (ref >39.00)
LDL Cholesterol: 109 mg/dL — ABNORMAL HIGH (ref 0–99)
NonHDL: 125.04
Total CHOL/HDL Ratio: 4
Triglycerides: 79 mg/dL (ref 0.0–149.0)
VLDL: 15.8 mg/dL (ref 0.0–40.0)

## 2020-11-26 LAB — CBC
HCT: 41.6 % (ref 36.0–46.0)
Hemoglobin: 14.1 g/dL (ref 12.0–15.0)
MCHC: 33.9 g/dL (ref 30.0–36.0)
MCV: 84.6 fl (ref 78.0–100.0)
Platelets: 284 10*3/uL (ref 150.0–400.0)
RBC: 4.91 Mil/uL (ref 3.87–5.11)
RDW: 13.1 % (ref 11.5–15.5)
WBC: 6.9 10*3/uL (ref 4.0–10.5)

## 2020-11-26 LAB — VITAMIN D 25 HYDROXY (VIT D DEFICIENCY, FRACTURES): VITD: 17.47 ng/mL — ABNORMAL LOW (ref 30.00–100.00)

## 2020-11-26 MED ORDER — CLONIDINE HCL 0.1 MG PO TABS: 0.1000 mg | ORAL_TABLET | Freq: Two times a day (BID) | ORAL | 3 refills | Status: DC

## 2020-11-26 MED ORDER — VITAMIN D (ERGOCALCIFEROL) 1.25 MG (50000 UNIT) PO CAPS
50000.0000 [IU] | ORAL_CAPSULE | ORAL | 0 refills | Status: DC
Start: 2020-11-26 — End: 2021-03-04

## 2020-11-26 MED ORDER — ESCITALOPRAM OXALATE 5 MG PO TABS: 5.0000 mg | ORAL_TABLET | Freq: Every day | ORAL | 2 refills | Status: DC

## 2020-11-26 NOTE — Patient Instructions (Addendum)
Give [place] 2-3 business days to get the results of your labs back.   Keep the diet clean and stay active.  Please schedule your visit with the gastroenterology.   I recommend getting the flu shot in mid October. This suggestion would change if the CDC comes out with a different recommendation.   Please consider counseling. Contact [phone] to schedule an appointment or inquire about cost/insurance coverage.  Considering increasing protein intake to help jump-start your metabolism.   Let us know if you need anything.

## 2020-11-26 NOTE — Progress Notes (Signed)
Chief Complaint  Patient presents with   Annual Exam     Well Woman [name] [name] is here for a complete physical.   Her last physical was >1 year ago.  Current diet: in general, a "healthy" diet. Current exercise: some walking, swimming, water aerobics, some weights. Fatigue out of ordinary? Yes Seatbelt? Yes Wt increased slightly.   Health Maintenance Pap/HPV- Yes Tetanus- Yes HIV screening- Yes Hep C screening- Yes  Depression Pt has been struggling w depression. Excessive sleep, depressed mood, anhedonia, sleep disruptions, weight gain. She does not follow w a counselor or psychologist. Stress stems largely from chronic abd pain issues. She has been on Effexor, Elavil, Pamelor for bowel issues, but noticed no improvement on her mood. She has failed Prozac as well.   Chronic abd pain Saw GI, dx'd w IBS. Failed meds as noted above. In process of getting 2nd opinion. Had to cancel initial appt, no f/u made. Still having alternating BM's, pain. No bleeding or wt loss. Gaining wt.   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   Migraine    Morbid obesity ([place])    Numbness and tingling of left leg    cold sensation, painful     Past Surgical History:  Procedure Laterality Date   FISTULOTOMY N/A 06/28/2017   Procedure: FISTULOTOMY;  Surgeon: Leighton Ruff, MD;  Location: [place];  Service: General;  Laterality: N/A;   FISTULOTOMY     says she has had 4 total   INCISION AND DRAINAGE ABSCESS ANAL     two separate times in ED   INCISION AND DRAINAGE PERIRECTAL ABSCESS Left 12/21/2016   Procedure: PLACEMENT OF SETON INTO FISTULA  AND INCISION AND DRAINAGE OF PERIRECTAL ABCESS;  Surgeon: Mickeal Skinner, MD;  [facility];  Service: General;  Laterality: Left;    IRRIGATION AND DEBRIDEMENT BUTTOCKS Left 12/28/2015   Procedure: IRRIGATION AND DEBRIDEMENT BUTTOCKS ABSCESS CHRONIC;  Surgeon: Erroll Luna, MD;  Location: [place];  Service: General;  Laterality: Left;   LIGATION OF INTERNAL FISTULA TRACT N/A 03/08/2017   Procedure: LIGATION OF INTERNAL INTERSPHINCTERIC FISTULA TRACT;  Surgeon: Mickeal Skinner, MD;  Location: [place];  Service: General;  Laterality: N/A;    Medications  Current Outpatient Medications on File Prior to Visit  Medication Sig Dispense Refill   acetaminophen (TYLENOL) 500 MG tablet Take 500 mg by mouth every 6 (six) hours as needed for mild pain.     Albuterol Sulfate 108 (90 Base) MCG/ACT AEPB Inhale 1-2 puffs into the lungs every 6 (six) hours as needed (sob). 1 each 1   dicyclomine (BENTYL) 20 MG tablet Take 1 tablet (20 mg total) by mouth 4 (four) times daily -  before meals and at bedtime. 90 tablet 3   linaclotide (LINZESS) 145 MCG CAPS capsule Take 1 capsule (145 mcg total) by mouth daily before breakfast. 30 capsule 2   medroxyPROGESTERone (DEPO-PROVERA) 150 MG/ML injection Inject 1 mL (150 mg total) into the muscle every 3 (three) months. 1 mL 5   nortriptyline (PAMELOR) 50 MG capsule Take 1 capsule (50 mg total) by mouth at bedtime. 30 capsule 3   omeprazole (PRILOSEC) 20 MG capsule Take 1 capsule (20 mg  total) by mouth daily. 90 each 3   pregabalin (LYRICA) 75 MG capsule Take 1 capsule (75 mg total) by mouth 2 (two) times daily. 60 capsule 2   valACYclovir (VALTREX) 1000 MG tablet Take 1,000 mg by mouth daily.     Vitamin D, Ergocalciferol, (DRISDOL) 1.25 MG (50000 UNIT) CAPS capsule Take 1 capsule (50,000 Units total) by mouth every 7 (seven) days. 12 capsule 0   Allergies  Allergen Reactions   Gabapentin     hallunciations   Cinnamon Other (See Comments)    Blisters on mouth and lips, coughing.     Hydrocodone Diarrhea    Review of Systems: Constitutional:  no  unexpected weight changes Eye:  no recent significant change in vision Ear/Nose/Mouth/Throat:  Ears:  no tinnitus or vertigo and no recent change in hearing Nose/Mouth/Throat:  no complaints of nasal congestion, no sore throat Cardiovascular: no chest pain Respiratory:  no cough and no shortness of breath Gastrointestinal: +chronic abdominal pain, no new changes in bowel habits GU:  Female: negative for dysuria or pelvic pain Musculoskeletal/Extremities:  no pain of the joints Integumentary (Skin/Breast):  no new abnormal skin lesions reported Neurologic:  no headaches Endocrine:  denies fatigue Hematologic/Lymphatic:  No areas of easy bleeding  Exam BP 112/78   Pulse 92   Temp 97.9 F (36.6 C) (Oral)   Ht '5\' 2"'$  (1.575 m)   Wt 258 lb 8 oz (117.3 kg)   SpO2 98%   BMI 47.28 kg/m  General:  well developed, well nourished, in no apparent distress Skin:  no significant moles, warts, or growths Head:  no masses, lesions, or tenderness Eyes:  pupils equal and round, sclera anicteric without injection Ears:  canals without lesions, TMs shiny without retraction, no obvious effusion, no erythema Nose:  nares patent, septum midline, mucosa normal, and no drainage or sinus tenderness Throat/Pharynx:  lips and gingiva without lesion; tongue and uvula midline; non-inflamed pharynx; no exudates or postnasal drainage Neck: neck supple without adenopathy, thyromegaly, or masses Lungs:  clear to auscultation, breath sounds equal bilaterally, no respiratory distress Cardio:  regular rate and rhythm, no bruits, no LE edema Abdomen:  abdomen soft, nontender; bowel sounds normal; no masses or organomegaly Genital: Defer to GYN Musculoskeletal:  symmetrical muscle groups noted without atrophy or deformity Extremities:  no clubbing, cyanosis, or edema, no deformities, no skin discoloration Neuro:  gait normal; deep tendon reflexes normal and symmetric Psych: well oriented with normal range of affect  and appropriate judgment/insight  Assessment and Plan  Well adult exam - Plan: CBC, Comprehensive metabolic panel, Lipid panel  Vitamin D deficiency - Plan: VITAMIN D 25 Hydroxy (Vit-D Deficiency, Fractures)  Chronic abdominal pain - Plan: cloNIDine (CATAPRES) 0.1 MG tablet  Moderate single current episode of major depressive disorder (HCC) - Plan: escitalopram (LEXAPRO) 5 MG tablet   Well 35 y.o. female. Counseled on diet and exercise. Other orders as above. Flu shot rec'd Oct. Covid vaccine rec'd, pt declines.  Depression: Chronic, uncontrolled. Start Lexapro 5 mg/d. Counseled on exercise. [facility] info provided. Chronic abd pain: chronic, uncontrolled. Hopefully above will help, will trial clonidine. I do not have any other solid options off the top of my head. I have curb-sided other IG specialists and she really needs to follow up with them for longer term management. I will refer to [facility] if this specialty visit isn't ideal.  Follow up in 6 weeks for med ck. The patient voiced understanding and agreement to the plan.  [employer]  Nolene Ebbs, DO 11/26/20 10:30 AM

## 2021-01-11 ENCOUNTER — Ambulatory Visit: Payer: 59 | Admitting: Family Medicine

## 2021-01-12 ENCOUNTER — Encounter: Payer: Self-pay | Admitting: Obstetrics

## 2021-01-12 ENCOUNTER — Ambulatory Visit (INDEPENDENT_AMBULATORY_CARE_PROVIDER_SITE_OTHER): Payer: 59 | Admitting: Obstetrics

## 2021-01-12 ENCOUNTER — Other Ambulatory Visit: Payer: Self-pay

## 2021-01-12 ENCOUNTER — Other Ambulatory Visit (HOSPITAL_COMMUNITY)
Admission: RE | Admit: 2021-01-12 | Discharge: 2021-01-12 | Disposition: A | Discharge status: Home / Self Care | Payer: 59 | Source: Ambulatory Visit | Attending: Obstetrics | Admitting: Obstetrics

## 2021-01-12 VITALS — BP 132/85 | HR 82 | Ht 62.0 in | Wt 258.0 lb

## 2021-01-12 DIAGNOSIS — Z113 Encounter for screening for infections with a predominantly sexual mode of transmission: Secondary | ICD-10-CM | POA: Diagnosis not present

## 2021-01-12 DIAGNOSIS — K603 Anal fistula, unspecified: Secondary | ICD-10-CM

## 2021-01-12 DIAGNOSIS — N898 Other specified noninflammatory disorders of vagina: Secondary | ICD-10-CM

## 2021-01-12 DIAGNOSIS — Z01419 Encounter for gynecological examination (general) (routine) without abnormal findings: Secondary | ICD-10-CM | POA: Diagnosis present

## 2021-01-12 DIAGNOSIS — N9412 Deep dyspareunia: Secondary | ICD-10-CM | POA: Diagnosis not present

## 2021-01-12 DIAGNOSIS — K219 Gastro-esophageal reflux disease without esophagitis: Secondary | ICD-10-CM

## 2021-01-12 DIAGNOSIS — Z3009 Encounter for other general counseling and advice on contraception: Secondary | ICD-10-CM

## 2021-01-12 DIAGNOSIS — Z6841 Body Mass Index (BMI) 40.0 and over, adult: Secondary | ICD-10-CM

## 2021-01-12 DIAGNOSIS — K582 Mixed irritable bowel syndrome: Secondary | ICD-10-CM

## 2021-01-12 NOTE — Progress Notes (Signed)
Subjective:        [name] [name] is a 35 y.o. female here for a routine exam.  Current complaints: Vaginal discharge.  Also c/o pain with intercourse that may be related to a previous fistula that was repaired.  The pain is less since the repair and stopping Depo Provera this past June.  The fistula that came out to the skin of the buttocks may have been caused by Crohn's, but they don't have pathology to support that diagnosis.  Personal health questionnaire:  Is patient [name], have a family history of breast and/or ovarian cancer: no Is there a family history of uterine cancer diagnosed at age < 40, gastrointestinal cancer, urinary tract cancer, family member who is a [name]: no Is the patient overweight and hypertensive, family history of diabetes, personal history of gestational diabetes, preeclampsia or PCOS: no Is patient over 42, have PCOS,  family history of premature CHD under age 60, diabetes, smoke, have hypertension or peripheral artery disease:  no At any time, has a partner hit, kicked or otherwise hurt or frightened you?: no Over the past 2 weeks, have you felt down, depressed or hopeless?: no Over the past 2 weeks, have you felt little interest or pleasure in doing things?:no   Gynecologic History Patient's last menstrual period was 09/30/2020. Contraception: none Last Pap: 03-17-2019. Results were: normal Last mammogram: n/a. 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   Migraine    Morbid obesity ([place])    Numbness and tingling of left leg    cold sensation, painful    Past Surgical History:  Procedure Laterality Date   FISTULOTOMY N/A 06/28/2017   Procedure: FISTULOTOMY;  Surgeon: Leighton Ruff, MD;  Location: [place];  Service: General;  Laterality: N/A;   FISTULOTOMY     says she has had 4 total   INCISION AND DRAINAGE ABSCESS ANAL     two separate times in ED   INCISION AND DRAINAGE PERIRECTAL ABSCESS Left 12/21/2016   Procedure: PLACEMENT OF SETON INTO FISTULA  AND INCISION AND DRAINAGE OF PERIRECTAL ABCESS;  Surgeon: Kinsinger, [name], MD;  [facility];  Service: General;  Laterality: Left;   IRRIGATION AND DEBRIDEMENT BUTTOCKS Left 12/28/2015   Procedure: IRRIGATION AND DEBRIDEMENT BUTTOCKS ABSCESS CHRONIC;  Surgeon: Erroll Luna, MD;  Location: [place];  Service: General;  Laterality: Left;   LIGATION OF INTERNAL FISTULA TRACT N/A 03/08/2017   Procedure: LIGATION OF INTERNAL INTERSPHINCTERIC FISTULA TRACT;  Surgeon: Mickeal Skinner, MD;  Location: [place];  Service: General;  Laterality: N/A;     Current Outpatient Medications:    cloNIDine (CATAPRES) 0.1 MG tablet, Take 1 tablet (0.1 mg total) by mouth 2 (two) times daily.,  Disp: 60 tablet, Rfl: 3   dicyclomine (BENTYL) 20 MG tablet, Take 1 tablet (20 mg total) by mouth 4 (four) times daily -  before meals and at bedtime., Disp: 90 tablet, Rfl: 3   escitalopram (LEXAPRO) 5 MG tablet, Take 1 tablet (5 mg total) by mouth daily., Disp: 30 tablet, Rfl: 2   omeprazole (PRILOSEC) 20 MG capsule, Take 1 capsule (20 mg total) by mouth daily., Disp: 90 each, Rfl: 3   Vitamin D, Ergocalciferol, (DRISDOL) 1.25 MG (50000 UNIT) CAPS capsule, Take 1 capsule (50,000 Units total) by mouth every 7 (seven) days., Disp: 12 capsule, Rfl: 0   acetaminophen (TYLENOL) 500 MG tablet, Take 500 mg by mouth  every 6 (six) hours as needed for mild pain. (Patient not taking: Reported on 01/12/2021), Disp: , Rfl:    Albuterol Sulfate 108 (90 Base) MCG/ACT AEPB, Inhale 1-2 puffs into the lungs every 6 (six) hours as needed (sob). (Patient not taking: Reported on 01/12/2021), Disp: 1 each, Rfl: 1   linaclotide (LINZESS) 145 MCG CAPS capsule, Take 1 capsule (145 mcg total) by mouth daily before breakfast. (Patient not taking: Reported on 01/12/2021), Disp: 30 capsule, Rfl: 2   medroxyPROGESTERone (DEPO-PROVERA) 150 MG/ML injection, Inject 1 mL (150 mg total) into the muscle every 3 (three) months. (Patient not taking: Reported on 01/12/2021), Disp: 1 mL, Rfl: 5   valACYclovir (VALTREX) 1000 MG tablet, Take 1,000 mg by mouth daily. (Patient not taking: Reported on 01/12/2021), Disp: , Rfl:  Allergies  Allergen Reactions   Gabapentin     hallunciations   Cinnamon Other (See Comments)    Blisters on mouth and lips, coughing.     Hydrocodone Diarrhea    Social History   Tobacco Use   Smoking status: Never   Smokeless tobacco: Never  Substance Use Topics   Alcohol use: Not Currently    Family History  Problem Relation Age of Onset   Alcoholism Father    Epilepsy Father    Heart disease Father    Breast cancer Maternal Aunt    Colon cancer Neg Hx    Esophageal cancer Neg Hx       Review of Systems  Constitutional: negative for fatigue and weight loss Respiratory: negative for cough and wheezing Cardiovascular: negative for chest pain, fatigue and palpitations Gastrointestinal: negative for abdominal pain and change in bowel habits Musculoskeletal:negative for myalgias Neurological: negative for gait problems and tremors Behavioral/Psych: negative for abusive relationship, depression Endocrine: negative for temperature intolerance    Genitourinary:negative for abnormal menstrual periods, genital lesions, hot flashes, sexual problems and vaginal discharge Integument/breast: negative for  breast lump, breast tenderness, nipple discharge and skin lesion(s)    Objective:       BP 132/85   Pulse 82   Ht 5\' 2"  (1.575 m)   Wt 258 lb (117 kg)   LMP 09/30/2020   BMI 47.19 kg/m  General:   alert  Skin:   no rash or abnormalities  Lungs:   clear to auscultation bilaterally  Heart:   regular rate and rhythm, S1, S2 normal, no murmur, click, rub or gallop  Breasts:   normal without suspicious masses, skin or nipple changes or axillary nodes  Abdomen:  normal findings: no organomegaly, soft, non-tender and no hernia  Pelvis:  External genitalia: normal general appearance Urinary system: urethral meatus normal and bladder without fullness, nontender Vaginal: normal without tenderness, induration or masses Cervix: normal appearance Adnexa: normal bimanual exam Uterus: anteverted and non-tender, normal size  Lab Review Urine pregnancy test Labs reviewed yes Radiologic studies reviewed no  I have spent a total of 20 minutes of face-to-face time, excluding clinical staff time, reviewing notes and preparing to see patient, ordering tests and/or medications, and counseling the patient.   Assessment:    1. Encounter for routine gynecological examination with Papanicolaou smear of cervix Rx: - Cytology - PAP( [place])  2. Deep dyspareunia - follow up with General Surgery  3. Vaginal discharge Rx: - Cervicovaginal ancillary only( [place])  4. Screening for STD (sexually transmitted disease) Rx: - Hepatitis B surface antigen - Hepatitis C antibody - HIV Antibody (routine testing w rflx) - [facility]  5. Encounter for other general counseling and advice on contraception - wants tubal sterilization  6. Class 3 severe obesity without serious comorbidity with body mass index (BMI) of 45.0 to 49.9 in adult, unspecified obesity type (HCC) - weight reduction with the aid of dietary changes, exercise and behavioral modification recommended  7. 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 Chronic GERD - Nexium seems effective. - managed by GI     Plan:    Education reviewed: calcium supplements, depression evaluation, low fat, low cholesterol diet, safe sex/STD prevention, self breast exams, and weight bearing exercise. Contraception: tubal ligation. Follow up in: 2 weeks.    Orders Placed This Encounter  Procedures   Hepatitis B surface antigen   Hepatitis C antibody   HIV Antibody (routine testing w rflx)   RPR     Shelly Bombard, MD 01/12/2021 4:08 PM

## 2021-01-12 NOTE — Progress Notes (Signed)
Pt presents for annual, pap, and all STD testing.  Pt c/o dyspareunia and wants to discuss tubal.

## 2021-01-13 LAB — CERVICOVAGINAL ANCILLARY ONLY
Bacterial Vaginitis (gardnerella): POSITIVE — AB
Candida Glabrata: NEGATIVE
Candida Vaginitis: POSITIVE — AB
Chlamydia: NEGATIVE
Comment: NEGATIVE
Comment: NEGATIVE
Comment: NEGATIVE
Comment: NEGATIVE
Comment: NEGATIVE
Comment: NORMAL
Neisseria Gonorrhea: NEGATIVE
Trichomonas: NEGATIVE

## 2021-01-13 LAB — CYTOLOGY - PAP
Comment: NEGATIVE
Diagnosis: NEGATIVE
High risk HPV: NEGATIVE

## 2021-01-13 LAB — HEPATITIS C ANTIBODY: Hep C Virus Ab: <0.1 s/co ratio (ref 0.0–0.9)

## 2021-01-13 LAB — HEPATITIS B SURFACE ANTIGEN: Hepatitis B Surface Ag: NEGATIVE

## 2021-01-13 LAB — RPR: RPR Ser Ql: NONREACTIVE

## 2021-01-13 LAB — HIV ANTIBODY (ROUTINE TESTING W REFLEX): HIV Screen 4th Generation wRfx: NONREACTIVE

## 2021-01-14 ENCOUNTER — Other Ambulatory Visit: Payer: Self-pay | Admitting: Obstetrics

## 2021-01-14 DIAGNOSIS — B3731 Acute candidiasis of vulva and vagina: Secondary | ICD-10-CM

## 2021-01-14 DIAGNOSIS — B9689 Other specified bacterial agents as the cause of diseases classified elsewhere: Secondary | ICD-10-CM

## 2021-01-14 MED ORDER — FLUCONAZOLE 150 MG PO TABS: 150.0000 mg | ORAL_TABLET | Freq: Once | ORAL | 0 refills | Status: AC

## 2021-01-14 MED ORDER — METRONIDAZOLE 500 MG PO TABS: 500.0000 mg | ORAL_TABLET | Freq: Two times a day (BID) | ORAL | 2 refills | Status: DC

## 2021-01-24 ENCOUNTER — Other Ambulatory Visit: Payer: Self-pay

## 2021-01-24 ENCOUNTER — Encounter: Payer: Self-pay | Admitting: Obstetrics and Gynecology

## 2021-01-24 ENCOUNTER — Ambulatory Visit: Payer: 59 | Admitting: Obstetrics and Gynecology

## 2021-01-24 DIAGNOSIS — Z3009 Encounter for other general counseling and advice on contraception: Secondary | ICD-10-CM | POA: Insufficient documentation

## 2021-01-24 NOTE — Patient Instructions (Signed)
Laparoscopic Tubal Ligation Laparoscopic tubal ligation is a procedure to close the fallopian tubes. This is done to prevent pregnancy. When the fallopian tubes are closed, the eggs that your ovaries release cannot enter the uterus, and sperm cannot reach the released eggs.Salpingectomy Salpingectomy, also called tubectomy, is the surgical removal of one of the fallopian tubes. The fallopian tubes allow eggs to travel from the ovaries to the uterus. Removing one fallopian tube does not prevent pregnancy. It also does not cause problems with your menstrual periods. You may need this procedure if you: Have an ectopic pregnancy. This is when a fertilized egg attaches to the fallopian tube instead of the uterus. An ectopic pregnancy can cause the tube to burst or tear (rupture). Have an infected fallopian tube. Have cancer of the fallopian tube or nearby organs. Have had an ovary removed due to a cyst or tumor. Have had your uterus removed. Are at high risk for ovarian cancer. There are three different methods that can be used for a salpingectomy: An open method in which one large incision is made in your abdomen. A laparoscopic method in which a thin, lighted tube with a tiny camera (laparoscope) is used to help perform the procedure. The laparoscope allows a surgeon to make several small incisions in the abdomen instead of one large incision. A robot-assisted method in which a computer is used to control surgical instruments that are attached to robotic arms. Tell a health care provider about: Any allergies you have. All medicines you are taking, including vitamins, herbs, eye drops, creams, and over-the-counter medicines. Any problems you or family members have had with anesthetic medicines. Any blood disorders you have. Any surgeries you have had. Any medical conditions you have. Whether you are pregnant or may be pregnant. What are the risks? Generally, this is a safe procedure. However,  problems may occur, including: Infection. Bleeding. Allergic reactions to medicines. Blood clots in the legs or lungs. Damage to nearby structures or organs. What happens before the procedure? Staying hydrated Follow instructions from your health care provider about hydration, which may include: Up to 2 hours before the procedure - you may continue to drink clear liquids, such as water, clear fruit juice, black coffee, and plain tea. Eating and drinking restrictions Follow instructions from your health care provider about eating and drinking, which may include: 8 hours before the procedure - stop eating heavy meals or foods, such as meat, fried foods, or fatty foods. 6 hours before the procedure - stop eating light meals or foods, such as toast or cereal. 6 hours before the procedure - stop drinking milk or drinks that contain milk. 2 hours before the procedure - stop drinking clear liquids. Medicines Ask your health care provider about: Changing or stopping your regular medicines. This is especially important if you are taking diabetes medicines or blood thinners. Taking medicines such as aspirin and ibuprofen. These medicines can thin your blood. Do not take these medicines unless your health care provider tells you to take them. Taking over-the-counter medicines, vitamins, herbs, and supplements. General instructions Do not use any products that contain nicotine or tobacco for at least 4 weeks before the procedure. These products include cigarettes, chewing tobacco, and vaping devices, such as e-cigarettes. If you need help quitting, ask your health care provider. You may have an exam or tests, such as an electrocardiogram (ECG) or a blood or urine test. Ask your health care provider: How your surgery site will be marked. What steps will be  taken to help prevent infection. These steps may include: Removing hair at the surgery site. Washing skin with a germ-killing soap. Taking  antibiotic medicine. Plan to have a responsible adult take you home from the hospital or clinic. If you will be going home right after the procedure, plan to have a responsible adult care for you for the time you are told. This is important. What happens during the procedure? An IV will be inserted into one of your veins. You will be given one or both of the following: A medicine to help you relax (sedative). A medicine to make you fall asleep (general anesthetic). A small, thin tube (catheter) may be inserted through your urethra and into your bladder. This will drain urine during your procedure. Depending on the type of procedure you are having, one incision or several small incisions will be made in your abdomen. Your fallopian tube and ovary will be cut away from the uterus and removed. Your blood vessels will be clamped and tied to prevent excess bleeding. The incision or incisions in your abdomen will be closed with stitches (sutures), staples, or skin glue. A bandage (dressing) may be placed over your incision or incisions. The procedure may vary among health care providers and hospitals. What happens after the procedure?  Your blood pressure, heart rate, breathing rate, and blood oxygen level will be monitored until you leave the hospital or clinic. You may continue to receive fluids and medicines through an IV. You may continue to have a catheter draining your urine. You may have to wear compression stockings. These stockings help to prevent blood clots and reduce swelling in your legs. You will be given pain medicine as needed. If you were given a sedative during the procedure, it can affect you for several hours. Do not drive or operate machinery until your health care provider says that it is safe. Summary Salpingectomy is a surgical procedure to remove one of the fallopian tubes. The procedure may be done with an open incision, a thin, lighted tube with a tiny camera  (laparoscope), or computer-controlled instruments. Depending on the type of procedure you have, one incision or several small incisions will be made in your abdomen. Your blood pressure, heart rate, breathing rate, and blood oxygen level will be monitored until you leave the hospital or clinic. Plan to have a responsible adult take you home from the hospital or clinic. This information is not intended to replace advice given to you by your health care provider. Make sure you discuss any questions you have with your health care provider. Document Revised: 02/10/2020 Document Reviewed: 02/10/2020 Elsevier Patient Education  2022 [place] should not have this procedure if you want to get pregnant someday or if you are unsure about having more children. Tell a health care provider about: Any allergies you have. All medicines you are taking, including vitamins, herbs, eye drops, creams, and over-the-counter medicines. Any problems you or family members have had with anesthetic medicines. Any blood disorders you have. Any surgeries you have had. Any medical conditions you have. Whether you are pregnant or may be pregnant. Any past pregnancies. What are the risks? Generally, this is a safe procedure. However, problems may occur, including: Infection. Bleeding. Injury to other organs in the abdomen. Side effects from anesthetic medicines. Failure of the procedure. This procedure can increase your risk of an ectopic pregnancy. This is a pregnancy in which a fertilized egg attaches to the outside of the uterus. What happens  before the procedure? Staying hydrated Follow instructions from your health care provider about hydration, which may include: Up to 2 hours before the procedure - you may continue to drink clear liquids, such as water, clear fruit juice, black coffee, and plain tea. Eating and drinking restrictions Follow instructions from your health care provider about eating and  drinking, which may include: 8 hours before the procedure - stop eating heavy meals or foods, such as meat, fried foods, or fatty foods. 6 hours before the procedure - stop eating light meals or foods, such as toast or cereal. 6 hours before the procedure - stop drinking milk or drinks that contain milk. 2 hours before the procedure - stop drinking clear liquids. Medicines Ask your health care provider about: Changing or stopping your regular medicines. This is especially important if you are taking diabetes medicines or blood thinners. Taking medicines such as aspirin and ibuprofen. These medicines can thin your blood. Do not take these medicines unless your health care provider tells you to take them. Taking over-the-counter medicines, vitamins, herbs, and supplements. Surgery safety Ask your health care provider: How your surgery site will be marked. What steps will be taken to help prevent infection. These steps may include: Removing hair at the surgery site. Washing skin with a germ-killing soap. Taking antibiotic medicine. General instructions Do not use any products that contain nicotine or tobacco for at least 4 weeks before the procedure. These products include cigarettes, chewing tobacco, and vaping devices, such as e-cigarettes. If you need help quitting, ask your health care provider. Plan to have someone take you home from the hospital. If you will be going home right after the procedure, plan to have a responsible adult care for you for the time you are told. This is important. What happens during the procedure?   An IV will be inserted into one of your veins. You will be given one or more of the following: A medicine to help you relax (sedative). A medicine to numb the area (local anesthetic). A medicine to make you fall asleep (general anesthetic). A medicine that is injected into an area of your body to numb everything below the injection site (regional anesthetic). Your  bladder may be emptied with a small tube (catheter). If you have been given a general anesthetic, a tube will be put down your throat to help you breathe. Two small incisions will be made in your lower abdomen and near your belly button. Your abdomen will be inflated with a gas. This will let the surgeon see better and will give the surgeon room to work. A lighted tube with camera (laparoscope) will be inserted into your abdomen through one of the incisions. Small instruments will be inserted through the other incision. The fallopian tubes will be tied off, burned (cauterized), or blocked with a clip, ring, or clamp. A small portion in the center of each fallopian tube may be removed. The gas will be released from the abdomen. The incisions will be closed with stitches (sutures). A bandage (dressing) will be placed over the incisions. The procedure may vary among health care providers and hospitals. What happens after the procedure? Your blood pressure, heart rate, breathing rate, and blood oxygen level will be monitored until you leave the hospital. You will be given medicine to help with pain, nausea, and vomiting as needed. You may have vaginal discharge after the procedure. You may need to wear a sanitary napkin. If you were given a sedative during the procedure,  it can affect you for several hours. Do not drive or operate machinery until your health care provider says that it is safe. Summary Laparoscopic tubal ligation is a procedure that is done to prevent pregnancy. You should not have this procedure if you want to get pregnant someday or if you are unsure about having more children. The procedure is done using a thin, lighted tube (laparoscope) with a camera attached that will be inserted into your abdomen through an incision. After the procedure you will be given medicine to help with pain, nausea, and vomiting as needed. Plan to have someone take you home from the hospital. This  information is not intended to replace advice given to you by your health care provider. Make sure you discuss any questions you have with your health care provider. Document Revised: 12/05/2019 Document Reviewed: 12/05/2019 Elsevier Patient Education  2022 [employer].

## 2021-01-24 NOTE — Progress Notes (Signed)
Patient presents for a tubal consult. Patient states that she stopped using depo 12/2019. She states that her cycles are irregular and have only had April and June this year. She denies having any unprotected sex since her last period. She would like to discuss having a tubal ligation.

## 2021-01-24 NOTE — Progress Notes (Signed)
[name] presents for discuss of tubal liagation. Pt declines any type of hormonal contraception. Pap smear UTD  PE AF VSS Lungs clear Heart RRR Abd soft + BS   A/P Unwanted fertility  Patient desires bilateral tubal sterilization.  Other reversible forms of contraception were discussed with patient; she declines all other modalities. Discussed bilateral tubal sterilization in detail; discussed options of laparoscopic bilateral tubal sterilization using Filshie clips vs laparoscopic bilateral salpingectomy. Risks and benefits discussed in detail including but not limited to: risk of regret, permanence of method, bleeding, infection, injury to surrounding organs and need for additional procedures.  Failure risk of 1-2 % for Filshie clips and <1% for bilateral salpingectomy with increased risk of ectopic gestation if pregnancy occurs was also discussed with patient.  Also discussed possible reduction of risk of ovarian cancer via bilateral salpingectomy given that a growing body of knowledge reveals that the majority of cases of high grade serous "ovarian" cancer actually are actually  cancers arising from the fimbriated end of the fallopian tubes. Emphasized that removal of fallopian tubes do not result in any known hormonal imbalance.  Patient verbalized understanding of these risks and benefits and wants to proceed with sterilization with laparoscopic bilateral sterilization via salpingectomy  She was told that she will be contacted by our surgical scheduler regarding the time and date of her surgery; routine preoperative instructions of having nothing to eat or drink after midnight on the day prior to surgery and also coming to the hospital 1 1/2 hours prior to her time of surgery were also emphasized.  She was told she may be called for a preoperative appointment about a week prior to surgery and will be given further preoperative instructions at that visit.  Routine postoperative instructions will be  reviewed with the patient and her family in detail after surgery. Printed patient education handouts about the procedure was given to the patient to review at home.

## 2021-01-26 ENCOUNTER — Telehealth: Payer: Self-pay

## 2021-01-26 NOTE — Telephone Encounter (Signed)
Called patient to discussed potential surgery dates, pt agreed to 12/7

## 2021-01-26 NOTE — Telephone Encounter (Signed)
-----   Message from [name], MD sent at 01/24/2021  4:29 PM EDT ----- Regarding: surgery Please scheudle pt for lap bilateral salpingectomy. Pt has private insurance, no tubal papers needed.  Thanks Legrand Como

## 2021-01-31 ENCOUNTER — Ambulatory Visit: Payer: 59 | Admitting: Family Medicine

## 2021-03-01 ENCOUNTER — Other Ambulatory Visit: Payer: 59

## 2021-03-04 ENCOUNTER — Other Ambulatory Visit: Payer: Self-pay

## 2021-03-04 ENCOUNTER — Encounter (HOSPITAL_BASED_OUTPATIENT_CLINIC_OR_DEPARTMENT_OTHER): Payer: Self-pay | Admitting: Obstetrics and Gynecology

## 2021-03-04 NOTE — Progress Notes (Addendum)
Spoke w/ via phone for pre-op interview--- pt Lab needs dos---- urine preg               Lab results------ no COVID test -----patient states asymptomatic no test needed Arrive at ------- 0630 on 03-09-2021 NPO after MN NO Solid Food.  Clear liquids from MN until--- 0530 Med rec completed Medications to take morning of surgery ----- clonodine and if needed take bentyl Diabetic medication ----- n/a Patient instructed no nail polish to be worn day of surgery Patient instructed to bring photo id and insurance card day of surgery Patient aware to have Driver (ride ) / caregiver for 24 hours after surgery --sig other, steven poole Patient Special Instructions ----- n/a Pre-Op special Istructions -----   pt does not take clonodine for hypertension , she takes for chronic abd pain Patient verbalized understanding of instructions that were given at this phone interview. Patient denies shortness of breath, chest pain, fever, cough at this phone interview.

## 2021-03-07 NOTE — H&P (Signed)
[name] is an 35 y.o. female with unwanted fertility. Tx options reviewed including IUD. Pt desires salpingectomy.    Menstrual History: Menarche age: 63 Patient's last menstrual period was 02/07/2021 (approximate).    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   MDD (major depressive disorder)    Migraine    Morbid obesity ([name])    Numbness and tingling of left leg    cold sensation, painful    Past Surgical History:  Procedure Laterality Date   COLONOSCOPY  02/06/2018   by dr Bryan Lemma   FISTULOTOMY N/A 06/28/2017   Procedure: FISTULOTOMY;  Surgeon: Leighton Ruff, MD;  Location: [name];  Service: General;  Laterality: N/A;   INCISION AND DRAINAGE PERIRECTAL ABSCESS Left 12/21/2016   Procedure: PLACEMENT OF SETON INTO FISTULA  AND INCISION AND DRAINAGE OF PERIRECTAL ABCESS;  Surgeon: Mickeal Skinner, MD;  Location: [name];  Service: General;  Laterality: Left;   IRRIGATION AND DEBRIDEMENT BUTTOCKS Left 12/28/2015   Procedure: IRRIGATION AND DEBRIDEMENT BUTTOCKS ABSCESS CHRONIC;  Surgeon: Erroll Luna, MD;  Location: [name];  Service: General;  Laterality: Left;   LIGATION OF INTERNAL FISTULA TRACT N/A 03/08/2017   Procedure: LIGATION OF INTERNAL INTERSPHINCTERIC FISTULA TRACT;  Surgeon: Kieth Brightly, [name], MD;  Location: [name];  Service: General;  Laterality: N/A;    Family History  Problem Relation Age of Onset   Alcoholism Father    Epilepsy Father    Heart disease Father     Breast cancer Maternal Aunt    Colon cancer Neg Hx    Esophageal cancer Neg Hx     Social History:  reports that she has never smoked. She has never used smokeless tobacco. She reports that she does not currently use alcohol. She reports that she does not use drugs.  Allergies:  Allergies  Allergen Reactions   Gabapentin     hallunciations   Cinnamon Other (See Comments)    Blisters on mouth and lips, coughing.     Hydrocodone Diarrhea    No medications prior to admission.    Review of Systems  Constitutional: Negative.   Respiratory: Negative.    Cardiovascular: Negative.   Gastrointestinal: Negative.   Genitourinary: Negative.    Height 5\' 2"  (1.575 m), weight 99.8 kg, last menstrual period 02/07/2021. Physical Exam Constitutional:      Appearance: Normal appearance.  Cardiovascular:     Rate and Rhythm: Normal rate and regular rhythm.  Pulmonary:     Effort: Pulmonary effort is normal.     Breath sounds: Normal breath sounds.  Abdominal:     General: Abdomen is flat.     Palpations: Abdomen is soft.  Genitourinary:    Comments: Nl EGBUS, uterus small mobile non tender Neurological:     Mental Status: She is alert.    No results found for this or any previous visit (from the past 24 hour(s)).  No results found.  Assessment/Plan: Unwanted Fertility   Pt desires lap  salpingectomy. R/B/Post op care reviewed with pt Pt has verbalized understanding and desires to proceed.   [name] 03/07/2021, 10:35 AM

## 2021-03-08 NOTE — Anesthesia Preprocedure Evaluation (Addendum)
Anesthesia Evaluation  Patient identified by MRN, date of birth, ID band Patient awake    Reviewed: Allergy & Precautions, H&P , NPO status , Patient's Chart, lab work & pertinent test results  Airway Mallampati: III  TM Distance: >3 FB Neck ROM: Full    Dental no notable dental hx. (+) Teeth Intact, Dental Advisory Given   Pulmonary neg pulmonary ROS,    Pulmonary exam normal breath sounds clear to auscultation       Cardiovascular Exercise Tolerance: Good negative cardio ROS   Rhythm:Regular Rate:Normal     Neuro/Psych  Headaches, Anxiety Depression    GI/Hepatic Neg liver ROS, GERD  Medicated,  Endo/Other  Morbid obesity  Renal/GU negative Renal ROS  negative genitourinary   Musculoskeletal  (+) Fibromyalgia -  Abdominal   Peds  Hematology negative hematology ROS (+)   Anesthesia Other Findings   Reproductive/Obstetrics negative OB ROS                            Anesthesia Physical Anesthesia Plan  ASA: 3  Anesthesia Plan: General   Post-op Pain Management: Tylenol PO (pre-op)   Induction: Intravenous  PONV Risk Score and Plan: 4 or greater and Ondansetron, Dexamethasone and Midazolam  Airway Management Planned: Oral ETT  Additional Equipment:   Intra-op Plan:   Post-operative Plan: Extubation in OR  Informed Consent: I have reviewed the patients History and Physical, chart, labs and discussed the procedure including the risks, benefits and alternatives for the proposed anesthesia with the patient or authorized representative who has indicated his/her understanding and acceptance.     Dental advisory given  Plan Discussed with: CRNA  Anesthesia Plan Comments:        Anesthesia Quick Evaluation

## 2021-03-09 ENCOUNTER — Encounter (HOSPITAL_BASED_OUTPATIENT_CLINIC_OR_DEPARTMENT_OTHER): Payer: Self-pay | Admitting: Obstetrics and Gynecology

## 2021-03-09 ENCOUNTER — Encounter (HOSPITAL_BASED_OUTPATIENT_CLINIC_OR_DEPARTMENT_OTHER)
Admission: RE | Disposition: A | Discharge status: Home / Self Care | Payer: Self-pay | Source: Home / Self Care | Attending: Obstetrics and Gynecology

## 2021-03-09 ENCOUNTER — Ambulatory Visit (HOSPITAL_BASED_OUTPATIENT_CLINIC_OR_DEPARTMENT_OTHER): Payer: 59 | Admitting: Anesthesiology

## 2021-03-09 ENCOUNTER — Ambulatory Visit (HOSPITAL_BASED_OUTPATIENT_CLINIC_OR_DEPARTMENT_OTHER)
Admission: RE | Admit: 2021-03-09 | Discharge: 2021-03-09 | Disposition: A | Discharge status: Home / Self Care | Payer: 59 | Attending: Obstetrics and Gynecology | Admitting: Obstetrics and Gynecology

## 2021-03-09 DIAGNOSIS — Z3009 Encounter for other general counseling and advice on contraception: Secondary | ICD-10-CM

## 2021-03-09 DIAGNOSIS — Z302 Encounter for sterilization: Secondary | ICD-10-CM | POA: Diagnosis present

## 2021-03-09 DIAGNOSIS — Z6841 Body Mass Index (BMI) 40.0 and over, adult: Secondary | ICD-10-CM | POA: Insufficient documentation

## 2021-03-09 HISTORY — DX: Other chronic pain: G89.29

## 2021-03-09 HISTORY — PX: LAPAROSCOPIC BILATERAL SALPINGECTOMY: SHX5889

## 2021-03-09 HISTORY — DX: Major depressive disorder, single episode, unspecified: F32.9

## 2021-03-09 HISTORY — DX: Generalized anxiety disorder: F41.1

## 2021-03-09 LAB — BASIC METABOLIC PANEL
Anion gap: 9 (ref 5–15)
BUN: 17 mg/dL (ref 6–20)
CO2: 21 mmol/L — ABNORMAL LOW (ref 22–32)
Calcium: 9.1 mg/dL (ref 8.9–10.3)
Chloride: 106 mmol/L (ref 98–111)
Creatinine, Ser: 0.51 mg/dL (ref 0.44–1.00)
GFR, Estimated: >60 mL/min (ref >60)
Glucose, Bld: 106 mg/dL — ABNORMAL HIGH (ref 70–99)
Potassium: 3.7 mmol/L (ref 3.5–5.1)
Sodium: 136 mmol/L (ref 135–145)

## 2021-03-09 LAB — CBC
HCT: 42.9 % (ref 36.0–46.0)
Hemoglobin: 14.7 g/dL (ref 12.0–15.0)
MCH: 28.9 pg (ref 26.0–34.0)
MCHC: 34.3 g/dL (ref 30.0–36.0)
MCV: 84.3 fL (ref 80.0–100.0)
Platelets: 315 10*3/uL (ref 150–400)
RBC: 5.09 MIL/uL (ref 3.87–5.11)
RDW: 12.4 % (ref 11.5–15.5)
WBC: 9.9 10*3/uL (ref 4.0–10.5)
nRBC: 0 % (ref 0.0–0.2)

## 2021-03-09 LAB — POCT PREGNANCY, URINE: Preg Test, Ur: NEGATIVE

## 2021-03-09 SURGERY — SALPINGECTOMY, BILATERAL, LAPAROSCOPIC
Anesthesia: General | Site: Abdomen | Laterality: Bilateral

## 2021-03-09 MED ORDER — LIDOCAINE 2% (20 MG/ML) 5 ML SYRINGE
INTRAMUSCULAR | Status: AC
  Filled 2021-03-09: qty 5

## 2021-03-09 MED ORDER — ACETAMINOPHEN 500 MG PO TABS: 1000.0000 mg | ORAL_TABLET | ORAL | Status: DC

## 2021-03-09 MED ORDER — LACTATED RINGERS IV SOLN: INTRAVENOUS | Status: DC

## 2021-03-09 MED ORDER — BUPIVACAINE HCL 0.5 % IJ SOLN
INTRAMUSCULAR | Status: DC | PRN
  Administered 2021-03-09: 30 mL

## 2021-03-09 MED ORDER — ACETAMINOPHEN 500 MG PO TABS
ORAL_TABLET | ORAL | Status: AC
  Filled 2021-03-09: qty 2

## 2021-03-09 MED ORDER — DEXAMETHASONE SODIUM PHOSPHATE 10 MG/ML IJ SOLN
INTRAMUSCULAR | Status: DC | PRN
  Administered 2021-03-09: 10 mg via INTRAVENOUS

## 2021-03-09 MED ORDER — PROPOFOL 10 MG/ML IV BOLUS
INTRAVENOUS | Status: DC | PRN
  Administered 2021-03-09: 200 mg via INTRAVENOUS

## 2021-03-09 MED ORDER — DEXAMETHASONE SODIUM PHOSPHATE 10 MG/ML IJ SOLN
INTRAMUSCULAR | Status: AC
  Filled 2021-03-09: qty 1

## 2021-03-09 MED ORDER — FENTANYL CITRATE (PF) 100 MCG/2ML IJ SOLN
INTRAMUSCULAR | Status: AC
  Filled 2021-03-09: qty 2

## 2021-03-09 MED ORDER — ROCURONIUM BROMIDE 10 MG/ML (PF) SYRINGE
PREFILLED_SYRINGE | INTRAVENOUS | Status: AC
  Filled 2021-03-09: qty 10

## 2021-03-09 MED ORDER — IBUPROFEN 800 MG PO TABS: 800.0000 mg | ORAL_TABLET | Freq: Three times a day (TID) | ORAL | 0 refills | Status: DC | PRN

## 2021-03-09 MED ORDER — ACETAMINOPHEN 500 MG PO TABS
1000.0000 mg | ORAL_TABLET | Freq: Once | ORAL | Status: AC
  Administered 2021-03-09: 1000 mg via ORAL

## 2021-03-09 MED ORDER — OXYCODONE HCL 5 MG/5ML PO SOLN: 5.0000 mg | Freq: Once | ORAL | Status: AC | PRN

## 2021-03-09 MED ORDER — ROCURONIUM BROMIDE 10 MG/ML (PF) SYRINGE
PREFILLED_SYRINGE | INTRAVENOUS | Status: DC | PRN
  Administered 2021-03-09: 70 mg via INTRAVENOUS

## 2021-03-09 MED ORDER — LIDOCAINE 2% (20 MG/ML) 5 ML SYRINGE
INTRAMUSCULAR | Status: DC | PRN
  Administered 2021-03-09: 60 mg via INTRAVENOUS

## 2021-03-09 MED ORDER — SOD CITRATE-CITRIC ACID 500-334 MG/5ML PO SOLN: 30.0000 mL | ORAL | Status: DC

## 2021-03-09 MED ORDER — MIDAZOLAM HCL 5 MG/5ML IJ SOLN
INTRAMUSCULAR | Status: DC | PRN
  Administered 2021-03-09: 2 mg via INTRAVENOUS

## 2021-03-09 MED ORDER — 0.9 % SODIUM CHLORIDE (POUR BTL) OPTIME
TOPICAL | Status: DC | PRN
  Administered 2021-03-09: 500 mL

## 2021-03-09 MED ORDER — KETOROLAC TROMETHAMINE 30 MG/ML IJ SOLN
INTRAMUSCULAR | Status: DC | PRN
  Administered 2021-03-09: 30 mg via INTRAVENOUS

## 2021-03-09 MED ORDER — ONDANSETRON HCL 4 MG/2ML IJ SOLN
INTRAMUSCULAR | Status: AC
  Filled 2021-03-09: qty 2

## 2021-03-09 MED ORDER — OXYCODONE HCL 5 MG PO TABS
ORAL_TABLET | ORAL | Status: AC
  Filled 2021-03-09: qty 1

## 2021-03-09 MED ORDER — HYDROMORPHONE HCL 1 MG/ML IJ SOLN: 0.2500 mg | INTRAMUSCULAR | Status: DC | PRN

## 2021-03-09 MED ORDER — SUGAMMADEX SODIUM 200 MG/2ML IV SOLN
INTRAVENOUS | Status: DC | PRN
  Administered 2021-03-09: 240 mg via INTRAVENOUS

## 2021-03-09 MED ORDER — KETOROLAC TROMETHAMINE 30 MG/ML IJ SOLN
INTRAMUSCULAR | Status: AC
  Filled 2021-03-09: qty 1

## 2021-03-09 MED ORDER — OXYCODONE HCL 5 MG PO TABS
5.0000 mg | ORAL_TABLET | Freq: Once | ORAL | Status: AC | PRN
  Administered 2021-03-09: 5 mg via ORAL

## 2021-03-09 MED ORDER — MIDAZOLAM HCL 2 MG/2ML IJ SOLN
INTRAMUSCULAR | Status: AC
  Filled 2021-03-09: qty 2

## 2021-03-09 MED ORDER — HYDROCODONE-ACETAMINOPHEN 5-325 MG PO TABS: 1.0000 | ORAL_TABLET | Freq: Four times a day (QID) | ORAL | 0 refills | Status: DC | PRN

## 2021-03-09 MED ORDER — POVIDONE-IODINE 10 % EX SWAB: 2.0000 "application " | Freq: Once | CUTANEOUS | Status: DC

## 2021-03-09 MED ORDER — PROPOFOL 500 MG/50ML IV EMUL
INTRAVENOUS | Status: AC
  Filled 2021-03-09: qty 100

## 2021-03-09 MED ORDER — ONDANSETRON HCL 4 MG/2ML IJ SOLN
INTRAMUSCULAR | Status: DC | PRN
  Administered 2021-03-09: 4 mg via INTRAVENOUS

## 2021-03-09 MED ORDER — FENTANYL CITRATE (PF) 100 MCG/2ML IJ SOLN
INTRAMUSCULAR | Status: DC | PRN
  Administered 2021-03-09: 100 ug via INTRAVENOUS
  Administered 2021-03-09 (×2): 50 ug via INTRAVENOUS

## 2021-03-09 SURGICAL SUPPLY — 32 items
ADH SKN CLS APL DERMABOND .7 (GAUZE/BANDAGES/DRESSINGS) ×1
CATH ROBINSON RED A/P 16FR (CATHETERS) ×2 IMPLANT
DERMABOND ADVANCED (GAUZE/BANDAGES/DRESSINGS) ×1
DERMABOND ADVANCED .7 DNX12 (GAUZE/BANDAGES/DRESSINGS) ×1 IMPLANT
DRSG ADAPTIC 3X8 NADH LF (GAUZE/BANDAGES/DRESSINGS) ×2 IMPLANT
DRSG OPSITE POSTOP 3X4 (GAUZE/BANDAGES/DRESSINGS) ×2 IMPLANT
DURAPREP 26ML APPLICATOR (WOUND CARE) ×2 IMPLANT
GAUZE 4X4 16PLY ~~LOC~~+RFID DBL (SPONGE) ×2 IMPLANT
GLOVE SRG 8 PF TXTR STRL LF DI (GLOVE) ×1 IMPLANT
GLOVE SURG ENC MOIS LTX SZ7.5 (GLOVE) ×2 IMPLANT
GLOVE SURG LTX SZ6.5 (GLOVE) ×2 IMPLANT
GLOVE SURG UNDER POLY LF SZ6.5 (GLOVE) ×2 IMPLANT
GLOVE SURG UNDER POLY LF SZ7 (GLOVE) ×8 IMPLANT
GLOVE SURG UNDER POLY LF SZ8 (GLOVE) ×2
GOWN STRL REUS W/TWL LRG LVL3 (GOWN DISPOSABLE) ×4 IMPLANT
GOWN STRL REUS W/TWL XL LVL3 (GOWN DISPOSABLE) ×2 IMPLANT
NS IRRIG 500ML POUR BTL (IV SOLUTION) ×2 IMPLANT
PACK LAPAROSCOPY BASIN (CUSTOM PROCEDURE TRAY) ×2 IMPLANT
PACK TRENDGUARD 450 HYBRID PRO (MISCELLANEOUS) ×1 IMPLANT
PAD OB MATERNITY 4.3X12.25 (PERSONAL CARE ITEMS) ×2 IMPLANT
PAD PREP 24X48 CUFFED NSTRL (MISCELLANEOUS) ×2 IMPLANT
SET TUBE SMOKE EVAC HIGH FLOW (TUBING) ×2 IMPLANT
SHEARS HARMONIC ACE PLUS 36CM (ENDOMECHANICALS) ×2 IMPLANT
SLEEVE SCD COMPRESS KNEE LRG (STOCKING) ×2 IMPLANT
SUT MNCRL AB 4-0 PS2 18 (SUTURE) ×2 IMPLANT
SUT VICRYL 0 UR6 27IN ABS (SUTURE) ×4 IMPLANT
TOWEL OR 17X26 10 PK STRL BLUE (TOWEL DISPOSABLE) ×2 IMPLANT
TRENDGUARD 450 HYBRID PRO PACK (MISCELLANEOUS) ×2
TROCAR 12M 150ML BLUNT (TROCAR) ×2 IMPLANT
TROCAR BALLN 12MMX100 BLUNT (TROCAR) ×2 IMPLANT
TROCAR BLADELESS OPT 5 100 (ENDOMECHANICALS) ×2 IMPLANT
WARMER LAPAROSCOPE (MISCELLANEOUS) ×2 IMPLANT

## 2021-03-09 NOTE — Anesthesia Postprocedure Evaluation (Signed)
Anesthesia Post Note  Patient: [name]  Procedure(s) Performed: LAPAROSCOPIC BILATERAL SALPINGECTOMY (Bilateral: Abdomen)     Patient location during evaluation: PACU Anesthesia Type: General Level of consciousness: awake and alert Pain management: pain level controlled Vital Signs Assessment: post-procedure vital signs reviewed and stable Respiratory status: spontaneous breathing, nonlabored ventilation and respiratory function stable Cardiovascular status: blood pressure returned to baseline and stable Postop Assessment: no apparent nausea or vomiting Anesthetic complications: no   No notable events documented.  Last Vitals:  Vitals:   03/09/21 1030 03/09/21 1121  BP: (!) 145/68 128/72  Pulse: 93 86  Resp: 18 18  Temp: 36.9 C 36.7 C  SpO2: 99% 97%    Last Pain:  Vitals:   03/09/21 1103  TempSrc:   PainSc: 6                  Riham Polyakov,W. EDMOND

## 2021-03-09 NOTE — Anesthesia Procedure Notes (Signed)
Procedure Name: Intubation Date/Time: 03/09/2021 8:33 AM Performed by: Bonney Aid, CRNA Pre-anesthesia Checklist: Patient identified, Emergency Drugs available, Suction available and Patient being monitored Patient Re-evaluated:Patient Re-evaluated prior to induction Oxygen Delivery Method: Circle system utilized Preoxygenation: Pre-oxygenation with 100% oxygen Induction Type: IV induction Ventilation: Mask ventilation without difficulty Laryngoscope Size: Mac and 3 Grade View: Grade I Tube type: Oral Number of attempts: 1 Airway Equipment and Method: Stylet Placement Confirmation: ETT inserted through vocal cords under direct vision, positive ETCO2 and breath sounds checked- equal and bilateral Secured at: 21 cm Tube secured with: Tape Dental Injury: Teeth and Oropharynx as per pre-operative assessment

## 2021-03-09 NOTE — Op Note (Signed)
Jalie Eiland Chao PROCEDURE DATE: 03/09/2021   PREOPERATIVE DIAGNOSIS:  Undesired fertility  POSTOPERATIVE DIAGNOSIS:  Undesired fertility  PROCEDURE:  Laparoscopic Bilateral Salpingectomy   SURGEON: Arlina Robes, MD   ANESTHESIA:  General endotracheal  COMPLICATIONS:  None immediate.  ESTIMATED BLOOD LOSS:  20 ml.  FLUIDS: 400 ml LR.  URINE OUTPUT:  As recorded   IINDICATIONS: 35 y.o. G1P1001 with undesired fertility, desires permanent sterilization. Other reversible forms of contraception were discussed with patient; she declines all other modalities.  Risks of procedure discussed with patient including permanence of method, risk of regret, bleeding, infection, injury to surrounding organs and need for additional procedures including laparotomy.  Failure risk less than 0.5% with increased risk of ectopic gestation if pregnancy occurs was also discussed with patient.  Written informed consent was obtained.    FINDINGS:  Normal uterus, fallopian tubes, and ovaries.  TECHNIQUE:  The patient was taken to the operating room where general anesthesia was obtained without difficulty.  She was then placed in the dorsal lithotomy position and prepared and draped in sterile fashion.  After an adequate timeout was performed, a bivalved speculum was then placed in the patient's vagina, and the anterior lip of cervix grasped with the single-tooth tenaculum.  The uterine manipulator was then advanced into the uterus.  The speculum was removed from the vagina.  Attention was then turned to the patient's abdomen where a 11-mm skin incision was made in the umbilical fold.  The Optiview 11-mm trocar and sleeve were then advanced without difficulty with the laparoscope under direct visualization into the abdomen.  The abdomen was then insufflated with carbon dioxide gas.  Adequate pneumoperitoneum was obtained.  A survey of the patient's pelvis and abdomen revealed the findings above. Bilateral 5-mm lower  quadrant ports  were then placed under direct visualization.  The fallopian tubes were transected from the uterine attachments and the underlying mesosalpinx with the Harmonic device allowing for bilateral salpingectomy.  The fallopian tubes were then removed from the abdomen under direct visualization.  The operative site was surveyed, and it was found to be hemostatic.   No intraoperative injury to other surrounding organs was noted.  The abdomen was desufflated and all instruments were then removed from the patient's abdomen. The fascial incision of the umbilicus was closed with a 0 Vicryl. All skin incisions were closed with 4/0 Monocryl and Dermabond.  The uterine manipulator was removed from the cervix without complications. The patient tolerated the procedure well.  Sponge, lap, and needle counts were correct times two.  The patient was then taken to the recovery room awake, extubated and in stable condition.  The patient will be discharged to home as per PACU criteria.  Routine postoperative instructions given.  She was prescribed Percocet and Ibuprofen She will follow up in the clinic  for postoperative evaluation.   Arlina Robes MD, [place] Attending [place], [place]

## 2021-03-09 NOTE — Transfer of Care (Signed)
Immediate Anesthesia Transfer of Care Note  Patient: JALEISA BROSE  Procedure(s) Performed: LAPAROSCOPIC BILATERAL SALPINGECTOMY (Bilateral: Abdomen)  Patient Location: PACU  Anesthesia Type:General  Level of Consciousness: drowsy  Airway & Oxygen Therapy: Patient Spontanous Breathing and Patient connected to nasal cannula oxygen  Post-op Assessment: Report given to RN  Post vital signs: Reviewed and stable  Last Vitals:  Vitals Value Taken Time  BP 142/63   Temp    Pulse 92 03/09/21 1000  Resp 15 03/09/21 1000  SpO2 100 % 03/09/21 1000  Vitals shown include unvalidated device data.  Last Pain:  Vitals:   03/09/21 2919  TempSrc: Oral  PainSc: 0-No pain      Patients Stated Pain Goal: 6 (16/60/60 0459)  Complications: No notable events documented.

## 2021-03-09 NOTE — Discharge Instructions (Addendum)
Laparoscopic BilateralTubal Removal/Laparoscopic Bilateral Salpingectomy Laparoscopic tubal removal is a procedure that removes the fallopian tubes at a time other than right after childbirth. By removing the fallopian tubes, the eggs that are released from the ovaries cannot enter the uterus and sperm cannot reach the egg. This is more effective than tubal ligation which is also known as getting your "tubes tied." Tubal removal is done so you will not be able to get pregnant or have a baby.  This procedure is permanent and irreversible. If you want to have future pregnancies, you should not have this procedure.  LET YOUR CAREGIVER KNOW ABOUT: Allergies to food or medicine. Medicines taken, including vitamins, herbs, eyedrops, over-the-counter medicines, and creams. Use of steroids (by mouth or creams). Previous problems with numbing medicines. History of bleeding problems or blood clots. Any recent colds or infections. Previous surgery. Other health problems, including diabetes and kidney problems. Possibility of pregnancy, if this applies. Any past pregnancies. RISKS AND COMPLICATIONS  Infection. Bleeding. Injury to surrounding organs. Anesthetic side effects. Failure of the procedure. Ectopic pregnancy. Future regret about having the procedure done. BEFORE THE PROCEDURE Do not take aspirin or blood thinners a week before the procedure or as directed. This can cause bleeding. Do not eat or drink anything 6 to 8 hours before the procedure. PROCEDURE  You may be given a medicine to help you relax (sedative) before the procedure. You will be given a medicine to make you sleep (general anesthetic) during the procedure. A tube will be put down your throat to help your breath while under general anesthesia. Two small cuts (incisions) are made in the lower abdominal area and one incision is made near the belly button. Your abdominal area will be inflated with a safe gas (carbon dioxide). This  helps give the surgeon room to operate, visualize, and helps the surgeon avoid other organs. A thin, lighted tube (laparoscope) with a camera attached is inserted into your abdomen through the incision near the belly button. Other small instruments are also inserted through the other abdominal incisions. The fallopian tubes are located and are removed. After the fallopian tubes are removed, the gas is released from the abdomen. The incisions will be closed with stitches (sutures), and Dermabond. A bandage may be placed over the incisions. AFTER THE PROCEDURE  You will also have some mild abdominal discomfort for 3-7 days. You will be given pain medicine to ease any discomfort. As long as there are no problems, you may be allowed to go home. Someone will need to drive you home and be with you for at least 24 hours once home. You may have some mild discomfort in the throat. This is from the tube placed in your throat while you were sleeping. You may experience discomfort in the shoulder area from some trapped air between the liver and diaphragm. This sensation is normal and will slowly go away on its own. HOME CARE INSTRUCTIONS  Take all medicines as directed. Only take over-the-counter or prescription medicines for pain, discomfort, or fever as directed by your caregiver. Resume daily activities as directed. Showers are preferred over baths. You may resume sexual activities in 1 week or as directed. Do not drive while taking narcotics. SEEK MEDICAL CARE IF: . There is increasing abdominal pain. You feel lightheaded or faint. You have the chills. You have an oral temperature above 102 F (38.9 C). There is pus-like (purulent) drainage from any of the wounds. You are unable to pass gas or have a   bowel movement. You feel sick to your stomach (nauseous) or throw up (vomit). MAKE SURE YOU:  Understand these instructions. Will watch your condition. Will get help right away if you are not doing  well or get worse.  ExitCare Patient Information 2013 [place].   No ibuprofen, Advil, Aleve, Motrin, ketorolac, meloxicam, naproxen, or other NSAIDS until after 7:45 pm today if needed. No Tylenol/acetaminophen until after 1:15 pm today if needed. Do not combine additional Tylenol with Hydrocodone/acetaminophen (Norco, Vicodin).     Post Anesthesia Home Care Instructions  Activity: Get plenty of rest for the remainder of the day. A responsible individual must stay with you for 24 hours following the procedure.  For the next 24 hours, DO NOT: -Drive a car -Paediatric nurse -Drink alcoholic beverages -Take any medication unless instructed by your physician -Make any legal decisions or sign important papers.  Meals: Start with liquid foods such as gelatin or soup. Progress to regular foods as tolerated. Avoid greasy, spicy, heavy foods. If nausea and/or vomiting occur, drink only clear liquids until the nausea and/or vomiting subsides. Call your physician if vomiting continues.  Special Instructions/Symptoms: Your throat may feel dry or sore from the anesthesia or the breathing tube placed in your throat during surgery. If this causes discomfort, gargle with warm salt water. The discomfort should disappear within 24 hours.  If you had a scopolamine patch placed behind your ear for the management of post- operative nausea and/or vomiting:  1. The medication in the patch is effective for 72 hours, after which it should be removed.  Wrap patch in a tissue and discard in the trash. Wash hands thoroughly with soap and water. 2. You may remove the patch earlier than 72 hours if you experience unpleasant side effects which may include dry mouth, dizziness or visual disturbances. 3. Avoid touching the patch. Wash your hands with soap and water after contact with the patch.

## 2021-03-09 NOTE — Interval H&P Note (Signed)
History and Physical Interval Note:  03/09/2021 8:18 AM  [name] [name]  has presented today for surgery, with the diagnosis of Undesired Fertility.  The various methods of treatment have been discussed with the patient and family. After consideration of risks, benefits and other options for treatment, the patient has consented to  Procedure(s): LAPAROSCOPIC BILATERAL SALPINGECTOMY (Bilateral) as a surgical intervention.  The patient's history has been reviewed, patient examined, no change in status, stable for surgery.  I have reviewed the patient's chart and labs.  Questions were answered to the patient's satisfaction.     [name]

## 2021-03-10 ENCOUNTER — Encounter (HOSPITAL_BASED_OUTPATIENT_CLINIC_OR_DEPARTMENT_OTHER): Payer: Self-pay | Admitting: Obstetrics and Gynecology

## 2021-03-10 LAB — SURGICAL PATHOLOGY

## 2021-04-06 ENCOUNTER — Encounter: Payer: Self-pay | Admitting: Obstetrics and Gynecology

## 2021-04-06 ENCOUNTER — Ambulatory Visit (INDEPENDENT_AMBULATORY_CARE_PROVIDER_SITE_OTHER): Payer: 59 | Admitting: Obstetrics and Gynecology

## 2021-04-06 ENCOUNTER — Other Ambulatory Visit: Payer: Self-pay

## 2021-04-06 DIAGNOSIS — Z9079 Acquired absence of other genital organ(s): Secondary | ICD-10-CM | POA: Diagnosis not present

## 2021-04-06 DIAGNOSIS — Z9889 Other specified postprocedural states: Secondary | ICD-10-CM | POA: Insufficient documentation

## 2021-04-06 NOTE — Progress Notes (Signed)
Post op visit BTL Reports pain at umbilicus incision and "tight" feeling in area around and below incision. No fever. Reports feeling when she has to be is different, denies dysuria or frequency.

## 2021-04-06 NOTE — Progress Notes (Signed)
Ms [place] presents for post op  S/P Lap bilateral salpingectomy on 03/09/21 Pathology reviewed with pt  Pt reports some soreness around incision site. Denies any bowel or bladder dysfunction Has had cycle since surgery  PE AF VSS Lungs clear Heart RRR Abd Soft + BS incision sites well healed  A/P Post op lap bilateral salpingectomy  Doing well. Return to nl ADL's as tolerates. F/U in 1 yr or PRN

## 2021-04-06 NOTE — Patient Instructions (Signed)

## 2021-08-21 ENCOUNTER — Emergency Department (HOSPITAL_COMMUNITY): Payer: 59

## 2021-08-21 ENCOUNTER — Other Ambulatory Visit: Payer: Self-pay

## 2021-08-21 ENCOUNTER — Emergency Department (HOSPITAL_COMMUNITY)
Admission: EM | Admit: 2021-08-21 | Discharge: 2021-08-21 | Disposition: A | Discharge status: Home / Self Care | Payer: 59 | Attending: Emergency Medicine | Admitting: Emergency Medicine

## 2021-08-21 DIAGNOSIS — R079 Chest pain, unspecified: Secondary | ICD-10-CM | POA: Diagnosis present

## 2021-08-21 DIAGNOSIS — E1165 Type 2 diabetes mellitus with hyperglycemia: Secondary | ICD-10-CM | POA: Diagnosis not present

## 2021-08-21 DIAGNOSIS — R200 Anesthesia of skin: Secondary | ICD-10-CM | POA: Diagnosis not present

## 2021-08-21 DIAGNOSIS — R000 Tachycardia, unspecified: Secondary | ICD-10-CM | POA: Diagnosis not present

## 2021-08-21 DIAGNOSIS — I10 Essential (primary) hypertension: Secondary | ICD-10-CM | POA: Diagnosis not present

## 2021-08-21 DIAGNOSIS — R0789 Other chest pain: Secondary | ICD-10-CM | POA: Diagnosis not present

## 2021-08-21 LAB — CBC
HCT: 41.5 % (ref 36.0–46.0)
Hemoglobin: 15 g/dL (ref 12.0–15.0)
MCH: 30.6 pg (ref 26.0–34.0)
MCHC: 36.1 g/dL — ABNORMAL HIGH (ref 30.0–36.0)
MCV: 84.7 fL (ref 80.0–100.0)
Platelets: 297 10*3/uL (ref 150–400)
RBC: 4.9 MIL/uL (ref 3.87–5.11)
RDW: 12.6 % (ref 11.5–15.5)
WBC: 9.2 10*3/uL (ref 4.0–10.5)
nRBC: 0 % (ref 0.0–0.2)

## 2021-08-21 LAB — BASIC METABOLIC PANEL
Anion gap: 5 (ref 5–15)
BUN: 13 mg/dL (ref 6–20)
CO2: 24 mmol/L (ref 22–32)
Calcium: 9.2 mg/dL (ref 8.9–10.3)
Chloride: 111 mmol/L (ref 98–111)
Creatinine, Ser: 0.54 mg/dL (ref 0.44–1.00)
GFR, Estimated: >60 mL/min (ref >60)
Glucose, Bld: 117 mg/dL — ABNORMAL HIGH (ref 70–99)
Potassium: 4 mmol/L (ref 3.5–5.1)
Sodium: 140 mmol/L (ref 135–145)

## 2021-08-21 LAB — TROPONIN I (HIGH SENSITIVITY): Troponin I (High Sensitivity): <2 ng/L (ref <18)

## 2021-08-21 LAB — D-DIMER, QUANTITATIVE: D-Dimer, Quant: <0.27 ug/mL-FEU (ref 0.00–0.50)

## 2021-08-21 MED ORDER — ALUM & MAG HYDROXIDE-SIMETH 200-200-20 MG/5ML PO SUSP
30.0000 mL | Freq: Once | ORAL | Status: AC
  Administered 2021-08-21: 30 mL via ORAL
  Filled 2021-08-21: qty 30

## 2021-08-21 MED ORDER — LIDOCAINE VISCOUS HCL 2 % MT SOLN
15.0000 mL | Freq: Once | OROMUCOSAL | Status: AC
  Administered 2021-08-21: 15 mL via ORAL
  Filled 2021-08-21: qty 15

## 2021-08-21 MED ORDER — KETOROLAC TROMETHAMINE 15 MG/ML IJ SOLN
15.0000 mg | Freq: Once | INTRAMUSCULAR | Status: AC
  Administered 2021-08-21: 15 mg via INTRAMUSCULAR
  Filled 2021-08-21: qty 1

## 2021-08-21 MED ORDER — NAPROXEN 500 MG PO TABS: 500.0000 mg | ORAL_TABLET | Freq: Two times a day (BID) | ORAL | 0 refills | Status: DC

## 2021-08-21 NOTE — ED Provider Notes (Signed)
[facility] DEPT Provider Note   CSN: [id number] Arrival date & time: 08/21/21  1135     History  Chief Complaint  Patient presents with   Chest Pain   Numbness    [name] [name] is a 36 y.o. female.   Chest Pain  Patient with medical history notable for chronic abdominal pain, MDD, GERD, GAD, fibromyalgia presents today due to chest pain.  Chest pain started 3 days ago, its constant but the severity waxes and wanes.  It is left-sided and radiates up her neck into her left arm.  Her left hand feels like pins-and-needles.  Moving and pushing on her left side of her chest makes the pain worse.  The pain itself is sharp, not associated with nausea or vomiting but she does feel short of breath.  She has tried treating it like muscle pain with ice and heat, also tried anti-inflammatories with no relief.  She does not take any medicine for hypertension, hyperlipidemia, diabetes.  No CAD in the family member less than 36yo.  Does not smoke cigarettes, no history of MI or PE.  Not on oral birth control.  Home Medications Prior to Admission medications   Medication Sig Start Date End Date Taking? Authorizing Provider  acetaminophen (TYLENOL) 325 MG tablet Take 650 mg by mouth every 6 (six) hours as needed for moderate pain.   Yes [provider]  ibuprofen (ADVIL) 200 MG tablet Take 200 mg by mouth every 6 (six) hours as needed for mild pain.   Yes [provider]  naproxen (NAPROSYN) 500 MG tablet Take 1 tablet (500 mg total) by mouth 2 (two) times daily. 08/21/21  Yes Sherrill Raring, PA-C  cloNIDine (CATAPRES) 0.1 MG tablet Take 1 tablet (0.1 mg total) by mouth 2 (two) times daily. Patient not taking: Reported on 08/21/2021 11/26/20   [name], DO  dicyclomine (BENTYL) 20 MG tablet Take 1 tablet (20 mg total) by mouth 4 (four) times daily -  before meals and at bedtime. Patient not taking: Reported on 08/21/2021 11/26/19   [name], DO  escitalopram (LEXAPRO) 5 MG tablet Take 1 tablet (5 mg total) by mouth daily. Patient not taking: Reported on 08/21/2021 11/26/20   [name], DO  HYDROcodone-acetaminophen (NORCO/VICODIN) 5-325 MG tablet Take 1 tablet by mouth every 6 (six) hours as needed for moderate pain. Patient not taking: Reported on 04/06/2021 03/09/21   [name], MD  ibuprofen (ADVIL) 800 MG tablet Take 1 tablet (800 mg total) by mouth every 8 (eight) hours as needed. Patient not taking: Reported on 04/06/2021 03/09/21   [name], MD  omeprazole (PRILOSEC) 20 MG capsule Take 1 capsule (20 mg total) by mouth daily. Patient not taking: Reported on 08/21/2021 04/09/20   [name], DO      Allergies    Gabapentin, Cinnamon, and Hydrocodone    Review of Systems   Review of Systems  Cardiovascular:  Positive for chest pain.   Physical Exam Updated Vital Signs BP 124/73 (BP Location: Left Arm)   Pulse 90   Temp 98.3 F (36.8 C) (Oral)   Resp (!) 21   Ht '5\' 2"'$  (1.575 m)   Wt 111.1 kg   SpO2 99%   BMI 44.81 kg/m  Physical Exam Vitals and nursing note reviewed. Exam conducted with a chaperone present.  Constitutional:      Appearance: Normal appearance.     Comments: BMI 44.81  HENT:  Head: Normocephalic and atraumatic.  Eyes:     General: No scleral icterus.       Right eye: No discharge.        Left eye: No discharge.     Extraocular Movements: Extraocular movements intact.     Pupils: Pupils are equal, round, and reactive to light.  Cardiovascular:     Rate and Rhythm: Regular rhythm. Tachycardia present.     Pulses: Normal pulses.     Heart sounds: Normal heart sounds. No murmur heard.   No friction rub. No gallop.  Pulmonary:     Effort: Pulmonary effort is normal. No respiratory distress.     Breath sounds: Normal breath sounds.  Chest:     Chest wall: Tenderness present.     Comments: Pinpoint reproducible left chest wall  pain Abdominal:     General: Abdomen is flat. Bowel sounds are normal. There is no distension.     Palpations: Abdomen is soft.     Tenderness: There is no abdominal tenderness.  Musculoskeletal:     Right lower leg: No edema.     Left lower leg: No edema.  Skin:    General: Skin is warm and dry.     Coloration: Skin is not jaundiced.  Neurological:     Mental Status: She is alert. Mental status is at baseline.     Coordination: Coordination normal.    ED Results / Procedures / Treatments   Labs (all labs ordered are listed, but only abnormal results are displayed) Labs Reviewed  BASIC METABOLIC PANEL - Abnormal; Notable for the following components:      Result Value   Glucose, Bld 117 (*)    All other components within normal limits  CBC - Abnormal; Notable for the following components:   MCHC 36.1 (*)    All other components within normal limits  D-DIMER, QUANTITATIVE  TROPONIN I (HIGH SENSITIVITY)    EKG None  Radiology DG Chest 2 View  Result Date: 08/21/2021 CLINICAL DATA:  Chest pain. EXAM: CHEST - 2 VIEW COMPARISON:  April 27, 2016 FINDINGS: The heart size and mediastinal contours are within normal limits. Both lungs are clear. The visualized skeletal structures are unremarkable. IMPRESSION: No active cardiopulmonary disease. Electronically Signed   By: Dorise Bullion III M.D.   On: 08/21/2021 13:18    Procedures Procedures    Medications Ordered in ED Medications  ketorolac (TORADOL) 15 MG/ML injection 15 mg (15 mg Intramuscular Given 08/21/21 1229)  alum & mag hydroxide-simeth (MAALOX/MYLANTA) 200-200-20 MG/5ML suspension 30 mL (30 mLs Oral Given 08/21/21 1221)    And  lidocaine (XYLOCAINE) 2 % viscous mouth solution 15 mL (15 mLs Oral Given 08/21/21 1221)    ED Course/ Medical Decision Making/ A&P                           Medical Decision Making Amount and/or Complexity of Data Reviewed Labs: ordered. Radiology: ordered.  Risk OTC  drugs. Prescription drug management.   This patient presents to the ED for concern of chest pain, this involves an extensive number of treatment options, and is a complaint that carries with it a high risk of complications and morbidity.  The differential diagnosis includes ACS, PE, GERD/derangement, AKI, anemia, B12 deficiency, esophagitis  Additional history obtained:   Reviewed external records, patient has been followed    Lab Tests:  I ordered, viewed, and personally interpreted labs.  The pertinent results include: No  leukocytosis or anemia.  BMP without any gross electrolyte derangement arrangement or AKI.  Glucose is slightly elevated 117.  Troponin less than 2, dimer negative as well.  Doubt PE.  I considered ACS but EKG is without any ischemic findings, negative troponin  Suspect this likely costochondritis especially given the reproducible pinpoint wall tenderness.    Imaging Studies ordered:  I directly visualized the CXR, which showed normal cardiac silhouette no acute process  I agree with the radiologist interpretation    ECG/Cardiac monitoring:   Per my interpretation, EKG shows sinus rhythm  The patient was maintained on a cardiac monitor.  Visualized monitor strip which showed sinus rhythm with heart rate of 81 per my interpretation.    Medicines ordered and prescription drug management:  I ordered medication including: Maalox, Toradol  I have reviewed the patients home medicines and have made adjustments as needed   Test Considered:  Considered observation or additional work-up with patient's heart score is very low.  Additionally pain improved with medicine, blood work-up is also reassuring when she is stable vitals.  No hypoxia, no tachycardia.  Do not feel additional work-up needed or hospitalization is indicated at this time.     Reevaluation:  After the interventions noted above, I reevaluated the patient and found resting comfortably.     Social Determinants of Health: Has PCP    Disposition:   After consideration of the diagnostic results and the patients response to treatment, I feel that the patent would benefit from outpatient follow up.            Final Clinical Impression(s) / ED Diagnoses Final diagnoses:  Chest wall pain    Rx / DC Orders ED Discharge Orders          Ordered    naproxen (NAPROSYN) 500 MG tablet  2 times daily        08/21/21 1402              Sherrill Raring, PA-C 08/21/21 1627    Lucrezia Starch, MD 08/23/21 [phone]

## 2021-08-21 NOTE — Discharge Instructions (Addendum)
Take the Naprosyn twice daily for the next week.  I suspect this will likely improve on its own.  Your work-up today was negative, if you are still in pain in the next week should follow-up with your primary care doctor for reevaluation.  If the pain changes or worsens return back to the ED for further evaluation.

## 2021-08-21 NOTE — ED Triage Notes (Signed)
Patient c/o left upper chest pain x 2-3 days. Patent states the pain radiates under the left breast, into the left shoulder blade and states she has numbness to the left hand. Patient states she has SOB when the pain is worse.

## 2022-03-30 ENCOUNTER — Emergency Department (HOSPITAL_BASED_OUTPATIENT_CLINIC_OR_DEPARTMENT_OTHER)
Admission: EM | Admit: 2022-03-30 | Discharge: 2022-03-31 | Disposition: A | Discharge status: Home / Self Care | Payer: No Typology Code available for payment source | Attending: Emergency Medicine | Admitting: Emergency Medicine

## 2022-03-30 ENCOUNTER — Other Ambulatory Visit: Payer: Self-pay

## 2022-03-30 ENCOUNTER — Encounter (HOSPITAL_BASED_OUTPATIENT_CLINIC_OR_DEPARTMENT_OTHER): Payer: Self-pay | Admitting: Emergency Medicine

## 2022-03-30 DIAGNOSIS — D72829 Elevated white blood cell count, unspecified: Secondary | ICD-10-CM | POA: Insufficient documentation

## 2022-03-30 DIAGNOSIS — R110 Nausea: Secondary | ICD-10-CM | POA: Diagnosis not present

## 2022-03-30 DIAGNOSIS — R1011 Right upper quadrant pain: Secondary | ICD-10-CM | POA: Insufficient documentation

## 2022-03-30 DIAGNOSIS — G8929 Other chronic pain: Secondary | ICD-10-CM

## 2022-03-30 LAB — CBC WITH DIFFERENTIAL/PLATELET
Abs Immature Granulocytes: 0.03 10*3/uL (ref 0.00–0.07)
Basophils Absolute: 0.1 10*3/uL (ref 0.0–0.1)
Basophils Relative: 1 %
Eosinophils Absolute: 0.3 10*3/uL (ref 0.0–0.5)
Eosinophils Relative: 3 %
HCT: 41.8 % (ref 36.0–46.0)
Hemoglobin: 14.6 g/dL (ref 12.0–15.0)
Immature Granulocytes: 0 %
Lymphocytes Relative: 39 %
Lymphs Abs: 4.4 10*3/uL — ABNORMAL HIGH (ref 0.7–4.0)
MCH: 29.4 pg (ref 26.0–34.0)
MCHC: 34.9 g/dL (ref 30.0–36.0)
MCV: 84.3 fL (ref 80.0–100.0)
Monocytes Absolute: 0.7 10*3/uL (ref 0.1–1.0)
Monocytes Relative: 7 %
Neutro Abs: 5.6 10*3/uL (ref 1.7–7.7)
Neutrophils Relative %: 50 %
Platelets: 294 10*3/uL (ref 150–400)
RBC: 4.96 MIL/uL (ref 3.87–5.11)
RDW: 12.1 % (ref 11.5–15.5)
WBC: 11.1 10*3/uL — ABNORMAL HIGH (ref 4.0–10.5)
nRBC: 0 % (ref 0.0–0.2)

## 2022-03-30 LAB — URINALYSIS, ROUTINE W REFLEX MICROSCOPIC
Bilirubin Urine: NEGATIVE
Glucose, UA: NEGATIVE mg/dL
Ketones, ur: NEGATIVE mg/dL
Leukocytes,Ua: NEGATIVE
Nitrite: NEGATIVE
Protein, ur: NEGATIVE mg/dL
Specific Gravity, Urine: 1.02 (ref 1.005–1.030)
pH: 5.5 (ref 5.0–8.0)

## 2022-03-30 LAB — COMPREHENSIVE METABOLIC PANEL
ALT: 41 U/L (ref 0–44)
AST: 48 U/L — ABNORMAL HIGH (ref 15–41)
Albumin: 3.6 g/dL (ref 3.5–5.0)
Alkaline Phosphatase: 36 U/L — ABNORMAL LOW (ref 38–126)
Anion gap: 8 (ref 5–15)
BUN: 10 mg/dL (ref 6–20)
CO2: 22 mmol/L (ref 22–32)
Calcium: 8.6 mg/dL — ABNORMAL LOW (ref 8.9–10.3)
Chloride: 108 mmol/L (ref 98–111)
Creatinine, Ser: 0.56 mg/dL (ref 0.44–1.00)
GFR, Estimated: >60 mL/min (ref >60)
Glucose, Bld: 93 mg/dL (ref 70–99)
Potassium: 4.1 mmol/L (ref 3.5–5.1)
Sodium: 138 mmol/L (ref 135–145)
Total Bilirubin: 0.3 mg/dL (ref 0.3–1.2)
Total Protein: 7.2 g/dL (ref 6.5–8.1)

## 2022-03-30 LAB — URINALYSIS, MICROSCOPIC (REFLEX)

## 2022-03-30 NOTE — ED Triage Notes (Signed)
Patient c/o upper abdominal pain x 4 days that radiates into her right side and down her back, with nausea and worsening pain today.

## 2022-03-31 ENCOUNTER — Emergency Department (HOSPITAL_BASED_OUTPATIENT_CLINIC_OR_DEPARTMENT_OTHER): Payer: No Typology Code available for payment source

## 2022-03-31 LAB — LIPASE, BLOOD: Lipase: 35 U/L (ref 11–51)

## 2022-03-31 MED ORDER — MORPHINE SULFATE (PF) 4 MG/ML IV SOLN
4.0000 mg | Freq: Once | INTRAVENOUS | Status: AC
  Administered 2022-03-31: 4 mg via INTRAVENOUS
  Filled 2022-03-31: qty 1

## 2022-03-31 MED ORDER — ONDANSETRON HCL 4 MG/2ML IJ SOLN
4.0000 mg | Freq: Once | INTRAMUSCULAR | Status: AC
  Administered 2022-03-31: 4 mg via INTRAVENOUS
  Filled 2022-03-31: qty 2

## 2022-03-31 MED ORDER — DICYCLOMINE HCL 20 MG PO TABS: 20.0000 mg | ORAL_TABLET | Freq: Three times a day (TID) | ORAL | 0 refills | Status: DC

## 2022-03-31 MED ORDER — ONDANSETRON 4 MG PO TBDP: 4.0000 mg | ORAL_TABLET | Freq: Three times a day (TID) | ORAL | 0 refills | Status: DC | PRN

## 2022-03-31 NOTE — ED Provider Notes (Signed)
[facility]  Provider Note  CSN: [id number] Arrival date & time: 03/30/22 1837  History Chief Complaint  Patient presents with   Abdominal Pain    [name] [name] is a 36 y.o. female with history of abdominal pain without a formal diagnosis reports 4 days of worsening, now constant epigastric pain radiating to her RUQ and into her back. Associated with nausea, no vomiting. No diarrhea or dysuria. No fevers. Has been evaluated for similar in the past but it has been a while.    Home Medications Prior to Admission medications   Medication Sig Start Date End Date Taking? Authorizing Provider  ondansetron (ZOFRAN-ODT) 4 MG disintegrating tablet Take 1 tablet (4 mg total) by mouth every 8 (eight) hours as needed for nausea or vomiting. 03/31/22  Yes Truddie Hidden, MD  acetaminophen (TYLENOL) 325 MG tablet Take 650 mg by mouth every 6 (six) hours as needed for moderate pain.    [provider]  dicyclomine (BENTYL) 20 MG tablet Take 1 tablet (20 mg total) by mouth 4 (four) times daily -  before meals and at bedtime. 03/31/22   Truddie Hidden, MD     Allergies    Gabapentin, Cinnamon, and Hydrocodone   Review of Systems   Review of Systems Please see HPI for pertinent positives and negatives  Physical Exam BP 134/80 (BP Location: Right Arm)   Pulse 76   Temp 98.2 F (36.8 C) (Oral)   Resp 18   Ht '5\' 2"'$  (1.575 m)   Wt 113.4 kg   LMP 02/28/2022 (Approximate)   SpO2 99%   BMI 45.73 kg/m   Physical Exam Vitals and nursing note reviewed.  Constitutional:      Appearance: Normal appearance.  HENT:     Head: Normocephalic and atraumatic.     Nose: Nose normal.     Mouth/Throat:     Mouth: Mucous membranes are moist.  Eyes:     Extraocular Movements: Extraocular movements intact.     Conjunctiva/sclera: Conjunctivae normal.  Cardiovascular:     Rate and Rhythm: Normal rate.  Pulmonary:     Effort: Pulmonary effort is  normal.     Breath sounds: Normal breath sounds.  Abdominal:     General: Abdomen is flat.     Palpations: Abdomen is soft.     Tenderness: There is abdominal tenderness in the right upper quadrant and epigastric area. There is no guarding. Negative signs include Murphy's sign and McBurney's sign.  Musculoskeletal:        General: No swelling. Normal range of motion.     Cervical back: Neck supple.  Skin:    General: Skin is warm and dry.  Neurological:     General: No focal deficit present.     Mental Status: She is alert.  Psychiatric:        Mood and Affect: Mood normal.     ED Results / Procedures / Treatments   EKG None  Procedures Procedures  Medications Ordered in the ED Medications  morphine (PF) 4 MG/ML injection 4 mg (4 mg Intravenous Given 03/31/22 0034)  ondansetron (ZOFRAN) injection 4 mg (4 mg Intravenous Given 03/31/22 0033)    Initial Impression and Plan  Patient here with RUQ abdominal pain concerning for biliary colic. Labs done in triage show CBC with mild leukocytosis. CMP is unremarkable. Lipase did not get done then, will add it now. UA is clear. Will send for [place] to eval gall stones/cholecystitis.  Pain/nausea meds for comfort.   ED Course   Clinical Course as of 03/31/22 0122  Fri Mar 31, 2022  7505 Lipase is normal.  [CS]  0119 Pain improved after meds in the ED.  I personally viewed the images from radiology studies and agree with radiologist interpretation:  [place] is neg for acute process. Plan discharge home with Rx for Bentyl, Zofran and recommend she advance diet as tolerated. Follow up with GI if not improving, RTED for any other concerns.  [CS]    Clinical Course User Index [CS] Truddie Hidden, MD     MDM Rules/Calculators/A&P Medical Decision Making Problems Addressed: RUQ pain: acute illness or injury  Amount and/or Complexity of Data Reviewed Labs: ordered. Decision-making details documented in ED Course. Radiology: ordered and  independent interpretation performed. Decision-making details documented in ED Course.  Risk Prescription drug management. Parenteral controlled substances.    Final Clinical Impression(s) / ED Diagnoses Final diagnoses:  RUQ pain    Rx / DC Orders ED Discharge Orders          Ordered    dicyclomine (BENTYL) 20 MG tablet  3 times daily before meals & bedtime        03/31/22 0121    ondansetron (ZOFRAN-ODT) 4 MG disintegrating tablet  Every 8 hours PRN        03/31/22 0121             Truddie Hidden, MD 03/31/22 [phone]

## 2022-04-13 ENCOUNTER — Ambulatory Visit (INDEPENDENT_AMBULATORY_CARE_PROVIDER_SITE_OTHER): Payer: No Typology Code available for payment source | Admitting: Family Medicine

## 2022-04-13 ENCOUNTER — Telehealth: Payer: Self-pay | Admitting: Family Medicine

## 2022-04-13 ENCOUNTER — Encounter: Payer: Self-pay | Admitting: Family Medicine

## 2022-04-13 VITALS — BP 126/80 | HR 80 | Temp 98.3°F | Wt 269.4 lb

## 2022-04-13 DIAGNOSIS — J101 Influenza due to other identified influenza virus with other respiratory manifestations: Secondary | ICD-10-CM

## 2022-04-13 LAB — POCT INFLUENZA A/B
Influenza A, POC: POSITIVE — AB
Influenza B, POC: NEGATIVE

## 2022-04-13 LAB — POC COVID19 BINAXNOW: SARS Coronavirus 2 Ag: NEGATIVE

## 2022-04-13 MED ORDER — FLUTICASONE PROPIONATE 50 MCG/ACT NA SUSP: 2.0000 | Freq: Every day | NASAL | 6 refills | Status: DC

## 2022-04-13 MED ORDER — IBUPROFEN 600 MG PO TABS: 600.0000 mg | ORAL_TABLET | Freq: Three times a day (TID) | ORAL | 0 refills | Status: DC | PRN

## 2022-04-13 MED ORDER — OSELTAMIVIR PHOSPHATE 75 MG PO CAPS: 75.0000 mg | ORAL_CAPSULE | Freq: Two times a day (BID) | ORAL | 0 refills | Status: DC

## 2022-04-13 MED ORDER — BENZONATATE 100 MG PO CAPS: 100.0000 mg | ORAL_CAPSULE | Freq: Two times a day (BID) | ORAL | 0 refills | Status: DC | PRN

## 2022-04-13 NOTE — Telephone Encounter (Signed)
Pt called and stated that she took a covid test and it came back negative

## 2022-04-13 NOTE — Patient Instructions (Signed)
Please be sure to drink plenty of fluids.   You may take the following OTC medications to help with symptoms:  For cough, use  cough syrups or other cough suppressants.  For headache, sore throat, fevers, muscle aches, chills, other pain, take ibuprofen or tylenol  For congestion, use nasal sprays, decongestants, or antihistamines  Please follow up if no improvement.   Go to ED if you have severe chest pain, fevers, shortness of breath or other worrisome symptoms.   

## 2022-04-13 NOTE — Progress Notes (Signed)
Assessment/Plan:   Problem List Items Addressed This Visit   None Visit Diagnoses     Influenza A    -  Primary   Relevant Medications   benzonatate (TESSALON) 100 MG capsule   ibuprofen (ADVIL) 600 MG tablet   oseltamivir (TAMIFLU) 75 MG capsule   fluticasone (FLONASE) 50 MCG/ACT nasal spray   Other Relevant Orders   POC COVID-19 (Completed)   POCT Influenza A/B (Completed)      The patient, despite a negative result on initial influenza testing, demonstrated a faint positive line upon recheck, indicating the presence of Influenza A. Her symptoms are consistent with this diagnosis as well as with the progression of the illness.  Differential Diagnosis: The differential diagnoses for Influenza include other viral upper respiratory infections, streptococcal pharyngitis, and mononucleosis. Given the positive influenza test result, the most likely diagnosis is Influenza A.  Plan: The patient will be prescribed oseltamivir (Tamiflu) '75mg'$  to commence immediately due to being within the 48-hour window of symptom onset. We will caution the patient about the potential GI side effects due to their history of abdominal issues. In addition, supportive care will continue with recommendations for rest, increased fluid intake, continuation of acetaminophen for fever and pain, as well as symptomatic treatment for cough with benzonatate (Tessalon) and nasal congestion with fluticasone (Flonase) as needed.  The patient has been counseled on watching for signs of worsening symptoms such as chest pain or shortness of breath and to return for medical attention if symptoms do not improve or become severe. Flu and COVID precautions were also discussed, including continued quarantine and supportive home care strategies.  There are no discontinued medications.    Subjective:  HPI: Encounter date: 04/13/2022  [name] [name] is a 37 y.o. female who has Irregular periods; GERD without esophagitis; Allergic  rhinitis, seasonal; Moderate single current episode of major depressive disorder ([place]); Perianal pain; Chronic abdominal pain; Seasonal allergies; Irritable bowel syndrome; Morbid obesity ([place]); Chronic constipation; Post-operative state; and Hx of bilateral salpingectomy on their problem list..   She  has a past medical history of Chronic abdominal pain, Chronic headaches, Fibromyalgia, GAD (generalized anxiety disorder), GERD (gastroesophageal reflux disease), History of COVID-19 (02/07/2020), History of herpes genitalis (dx 2011), MDD (major depressive disorder), Migraine, Morbid obesity ([place]), and Numbness and tingling of left leg..   Encounter date: 04/13/2022  CHIEF COMPLAINT: A 37 year old female patient presents with a sore throat, headaches, runny nose, fatigue, worsening over the past day, and she has been treating with OTC cold and flu medication.  HISTORY OF PRESENT ILLNESS:  Problem 1: The patient began experiencing general malaise last Tuesday afternoon and by yesterday had developed full-blown symptoms including congestion, burning in the eyes and sides, sore throat, and headaches. Her left eye has become watery and she reports feeling achy, tired, and has had a low-grade fever. She has been taking a combination OTC medication containing acetaminophen, dextromethorphan, and chlorpheniramine which she reports has helped alleviate her symptoms without causing her heart to race, a side effect she has experienced with other medications.  Problem 2: The patient has a history of chronic abdominal pain and was in the emergency room last Thursday night for this issue. Her abdominal pain is better now, and she currently experiences occasional nausea without vomiting. She is on dicyclomine and ondansetron for these symptoms.  REVIEW OF SYSTEMS: The patient endorses fatigue, sore throat, congestion, headache, watery eyes, low-grade fever, achiness, the burning sensation at her side, and occasional  nausea.  All other systems reviewed and are negative.    Past Surgical History:  Procedure Laterality Date   COLONOSCOPY  02/06/2018   by dr Bryan Lemma   FISTULOTOMY N/A 06/28/2017   Procedure: FISTULOTOMY;  Surgeon: Leighton Ruff, MD;  Location: [facility];  Service: General;  Laterality: N/A;   INCISION AND DRAINAGE PERIRECTAL ABSCESS Left 12/21/2016   Procedure: PLACEMENT OF SETON INTO FISTULA  AND INCISION AND DRAINAGE OF PERIRECTAL ABCESS;  Surgeon: Mickeal Skinner, MD;  Location: [facility];  Service: General;  Laterality: Left;   IRRIGATION AND DEBRIDEMENT BUTTOCKS Left 12/28/2015   Procedure: IRRIGATION AND DEBRIDEMENT BUTTOCKS ABSCESS CHRONIC;  Surgeon: Erroll Luna, MD;  Location: [place];  Service: General;  Laterality: Left;   LAPAROSCOPIC BILATERAL SALPINGECTOMY Bilateral 03/09/2021   Procedure: LAPAROSCOPIC BILATERAL SALPINGECTOMY;  Surgeon: [name], MD;  Location: [facility];  Service: Gynecology;  Laterality: Bilateral;   LIGATION OF INTERNAL FISTULA TRACT N/A 03/08/2017   Procedure: LIGATION OF INTERNAL INTERSPHINCTERIC FISTULA TRACT;  Surgeon: Kieth Brightly, [name], MD;  Location: [place];  Service: General;  Laterality: N/A;   TUBAL LIGATION      Outpatient Medications Prior to Visit  Medication Sig Dispense Refill   acetaminophen (TYLENOL) 325 MG tablet Take 650 mg by mouth every 6 (six) hours as needed for moderate pain.     dicyclomine (BENTYL) 20 MG tablet Take 1 tablet (20 mg total) by mouth 4 (four) times daily -  before meals and at bedtime. 20 tablet 0   ondansetron (ZOFRAN-ODT) 4 MG disintegrating tablet Take 1 tablet (4 mg total) by mouth every 8 (eight) hours as needed for nausea or vomiting. 20 tablet 0   No facility-administered medications prior to visit.    Family History  Problem Relation Age of Onset   Alcoholism Father    Epilepsy Father    Heart disease Father     Breast cancer Maternal Aunt    Colon cancer Neg Hx    Esophageal cancer Neg Hx     Social History   Socioeconomic History   Marital status: Single    Spouse name: Not on file   Number of children: 1   Years of education: Not on file   Highest education level: Not on file  Occupational History   Occupation: Engineer, manufacturing: [employer]  Tobacco Use   Smoking status: Never   Smokeless tobacco: Never  Vaping Use   Vaping Use: Never used  Substance and Sexual Activity   Alcohol use: Not Currently   Drug use: Never   Sexual activity: Yes    Partners: Male    Birth control/protection: Condom  Other Topics Concern   Not on file  Social History Narrative   G0   Live w/ boyfriend   Social Determinants of Health   Financial Resource Strain: Not on file  Food Insecurity: Not on file  Transportation Needs: Not on file  Physical Activity: Not on file  Stress: Not on file  Social Connections: Not on file  Intimate Partner Violence: Not on file  Objective:  Physical Exam: BP 126/80 (BP Location: Left Arm, Patient Position: Sitting, Cuff Size: Large)   Pulse 80   Temp 98.3 F (36.8 C) (Oral)   Wt 269 lb 6.4 oz (122.2 kg)   LMP 02/28/2022 (Approximate)   SpO2 97%   BMI 49.27 kg/m    General: No acute distress. Awake and conversant.  Eyes: Normal conjunctiva, anicteric. Round symmetric pupils.  ENT: Hearing grossly intact. No nasal discharge.  Neck: Neck is supple. No masses or thyromegaly.  Respiratory: Respirations are non-labored. No auditory wheezing.  Skin: Warm. No rashes or ulcers.  Psych: Alert and oriented. Cooperative, Appropriate mood and affect, Normal judgment.  CV: No cyanosis or JVD MSK: Normal ambulation. No clubbing  Neuro: Sensation and CN II-XII grossly normal.   Results for orders placed or performed in visit on 04/13/22  POC  COVID-19  Result Value Ref Range   SARS Coronavirus 2 Ag Negative Negative  POCT Influenza A/B  Result Value Ref Range   Influenza A, POC Positive (A) Negative   Influenza B, POC Negative Negative        Alesia Banda, MD, MS

## 2022-04-13 NOTE — Telephone Encounter (Signed)
Noted. Patient has appointment today with Dr. Grandville Silos at 10:40

## 2022-04-21 ENCOUNTER — Ambulatory Visit (HOSPITAL_BASED_OUTPATIENT_CLINIC_OR_DEPARTMENT_OTHER)
Admission: RE | Admit: 2022-04-21 | Discharge: 2022-04-21 | Disposition: A | Discharge status: Home / Self Care | Payer: No Typology Code available for payment source | Source: Ambulatory Visit | Attending: Family Medicine | Admitting: Family Medicine

## 2022-04-21 ENCOUNTER — Encounter: Payer: Self-pay | Admitting: Family Medicine

## 2022-04-21 ENCOUNTER — Ambulatory Visit: Payer: No Typology Code available for payment source | Admitting: Family Medicine

## 2022-04-21 VITALS — BP 134/64 | HR 85 | Temp 98.1°F | Resp 16 | Ht 62.0 in | Wt 268.0 lb

## 2022-04-21 DIAGNOSIS — R5383 Other fatigue: Secondary | ICD-10-CM | POA: Diagnosis not present

## 2022-04-21 DIAGNOSIS — Z1152 Encounter for screening for COVID-19: Secondary | ICD-10-CM | POA: Diagnosis not present

## 2022-04-21 DIAGNOSIS — R0602 Shortness of breath: Secondary | ICD-10-CM

## 2022-04-21 DIAGNOSIS — R52 Pain, unspecified: Secondary | ICD-10-CM

## 2022-04-21 LAB — CBC
HCT: 44.6 % (ref 36.0–46.0)
Hemoglobin: 15.2 g/dL — ABNORMAL HIGH (ref 12.0–15.0)
MCHC: 34.1 g/dL (ref 30.0–36.0)
MCV: 86.2 fl (ref 78.0–100.0)
Platelets: 360 10*3/uL (ref 150.0–400.0)
RBC: 5.17 Mil/uL — ABNORMAL HIGH (ref 3.87–5.11)
RDW: 12.7 % (ref 11.5–15.5)
WBC: 7.5 10*3/uL (ref 4.0–10.5)

## 2022-04-21 LAB — BASIC METABOLIC PANEL
BUN: 11 mg/dL (ref 6–23)
CO2: 24 mEq/L (ref 19–32)
Calcium: 9 mg/dL (ref 8.4–10.5)
Chloride: 102 mEq/L (ref 96–112)
Creatinine, Ser: 0.67 mg/dL (ref 0.40–1.20)
GFR: 111.34 mL/min (ref >60.00)
Glucose, Bld: 89 mg/dL (ref 70–99)
Potassium: 4.4 mEq/L (ref 3.5–5.1)
Sodium: 137 mEq/L (ref 135–145)

## 2022-04-21 LAB — POC COVID19 BINAXNOW: SARS Coronavirus 2 Ag: NEGATIVE

## 2022-04-21 MED ORDER — ALBUTEROL SULFATE HFA 108 (90 BASE) MCG/ACT IN AERS: 2.0000 | INHALATION_SPRAY | Freq: Four times a day (QID) | RESPIRATORY_TRACT | 0 refills | Status: DC | PRN

## 2022-04-21 MED ORDER — PREDNISONE 20 MG PO TABS: 40.0000 mg | ORAL_TABLET | Freq: Every day | ORAL | 0 refills | Status: AC

## 2022-04-21 NOTE — Progress Notes (Signed)
Acute Office Visit  Subjective:     Patient ID: [name] [name], female    DOB: 1985-10-27, 37 y.o.   MRN: [id number]  Chief Complaint  Patient presents with   Fatigue   Diarrhea   Abdominal Pain     Patient is in today for general malaise, recent flu.    Patient tested positive for Flu on 04/13/22 and was treated with Tamiflu, benzonatate, and supportive measures. States that by Sunday the sore throat and congestion went away, but then she started with severe fatigue and body aches. States she is so tired, she has to get help showering. She is having general body aches "all the way to the bones." She has been having to take tylenol and ibuprofen around the clock for body aches, so unsure if she would be running a temp otherwise. She has had chills, sweats, shortness of breath, chest/abdominal soreness/tightness. States her abdomen always hurts, but she has had some diarrhea the past few days as well. States this is very similar to how she felt when she had COVID in November 2021. She denies cough, urinary changes, vomiting, rashes.      All review of systems negative except what is listed in the HPI      Objective:    BP 134/64   Pulse 85   Temp 98.1 F (36.7 C)   Resp 16   Ht '5\' 2"'$  (1.575 m)   Wt 268 lb (121.6 kg)   LMP 02/28/2022 (Approximate)   SpO2 96%   BMI 49.02 kg/m    Physical Exam Vitals reviewed.  Constitutional:      Appearance: Normal appearance.  HENT:     Head: Normocephalic and atraumatic.  Cardiovascular:     Rate and Rhythm: Normal rate and regular rhythm.     Pulses: Normal pulses.     Heart sounds: Normal heart sounds.  Pulmonary:     Effort: Pulmonary effort is normal.     Breath sounds: Normal breath sounds.  Abdominal:     General: Bowel sounds are normal. There is no distension.     Palpations: Abdomen is soft.     Tenderness: There is generalized abdominal tenderness.  Musculoskeletal:        General: No swelling. Normal range of  motion.     Comments: Upper and lower chest tender to palpation  Skin:    General: Skin is warm and dry.  Neurological:     Mental Status: She is alert and oriented to person, place, and time.  Psychiatric:        Behavior: Behavior normal.        Thought Content: Thought content normal.        Judgment: Judgment normal.     Comments: crying     Results for orders placed or performed in visit on 04/21/22  POC COVID-19  Result Value Ref Range   SARS Coronavirus 2 Ag Negative Negative        Assessment & Plan:   1. Shortness of breath 2. Body aches 3. Fatigue, unspecified type 4. Encounter for screening for COVID-19 COVID negative Given your shortness of breath, let's get a chest xray to make sure no pneumonia is developing. Try prednisone and as needed albuterol inhaler (do not take ibuprofen or Aleve while on prednisone). Continue supportive measures including rest, hydration, humidifier use, steam showers, warm compresses to sinuses, warm liquids with lemon and honey, and over-the-counter cough, cold, and analgesics as needed.   Please contact  office for follow-up if symptoms do not improve or worsen. Seek emergency care if symptoms become severe.     - DG Chest 2 View - CBC - Basic Metabolic Panel (BMET) - predniSONE (DELTASONE) 20 MG tablet; Take 2 tablets (40 mg total) by mouth daily with breakfast for 5 days.  Dispense: 10 tablet; Refill: 0 - albuterol (VENTOLIN HFA) 108 (90 Base) MCG/ACT inhaler; Inhale 2 puffs into the lungs every 6 (six) hours as needed for wheezing or shortness of breath.  Dispense: 8 g; Refill: 0 - POC COVID-19    Return if symptoms worsen or fail to improve.  Terrilyn Saver, NP

## 2022-04-21 NOTE — Patient Instructions (Signed)
COVID negative Given your shortness of breath, let's get a chest xray to make sure no pneumonia is developing. Try prednisone and as needed albuterol inhaler (do not take ibuprofen or Aleve while on prednisone). Continue supportive measures including rest, hydration, humidifier use, steam showers, warm compresses to sinuses, warm liquids with lemon and honey, and over-the-counter cough, cold, and analgesics as needed.   Please contact office for follow-up if symptoms do not improve or worsen. Seek emergency care if symptoms become severe.

## 2022-06-27 ENCOUNTER — Encounter (HOSPITAL_COMMUNITY): Payer: Self-pay

## 2022-06-27 ENCOUNTER — Emergency Department (HOSPITAL_COMMUNITY): Payer: No Typology Code available for payment source

## 2022-06-27 ENCOUNTER — Other Ambulatory Visit: Payer: Self-pay

## 2022-06-27 ENCOUNTER — Emergency Department (HOSPITAL_COMMUNITY)
Admission: EM | Admit: 2022-06-27 | Discharge: 2022-06-27 | Disposition: A | Discharge status: Home / Self Care | Payer: No Typology Code available for payment source | Attending: Emergency Medicine | Admitting: Emergency Medicine

## 2022-06-27 DIAGNOSIS — R1031 Right lower quadrant pain: Secondary | ICD-10-CM | POA: Diagnosis present

## 2022-06-27 DIAGNOSIS — N390 Urinary tract infection, site not specified: Secondary | ICD-10-CM | POA: Diagnosis not present

## 2022-06-27 DIAGNOSIS — R109 Unspecified abdominal pain: Secondary | ICD-10-CM

## 2022-06-27 LAB — URINALYSIS, ROUTINE W REFLEX MICROSCOPIC
Bilirubin Urine: NEGATIVE
Glucose, UA: NEGATIVE mg/dL
Hgb urine dipstick: NEGATIVE
Ketones, ur: NEGATIVE mg/dL
Nitrite: NEGATIVE
Protein, ur: NEGATIVE mg/dL
Specific Gravity, Urine: 1.021 (ref 1.005–1.030)
pH: 5 (ref 5.0–8.0)

## 2022-06-27 LAB — CBC WITH DIFFERENTIAL/PLATELET
Abs Immature Granulocytes: 0.02 10*3/uL (ref 0.00–0.07)
Basophils Absolute: 0.1 10*3/uL (ref 0.0–0.1)
Basophils Relative: 1 %
Eosinophils Absolute: 0.2 10*3/uL (ref 0.0–0.5)
Eosinophils Relative: 2 %
HCT: 42.7 % (ref 36.0–46.0)
Hemoglobin: 14.3 g/dL (ref 12.0–15.0)
Immature Granulocytes: 0 %
Lymphocytes Relative: 46 %
Lymphs Abs: 4.1 10*3/uL — ABNORMAL HIGH (ref 0.7–4.0)
MCH: 29 pg (ref 26.0–34.0)
MCHC: 33.5 g/dL (ref 30.0–36.0)
MCV: 86.6 fL (ref 80.0–100.0)
Monocytes Absolute: 0.7 10*3/uL (ref 0.1–1.0)
Monocytes Relative: 8 %
Neutro Abs: 3.7 10*3/uL (ref 1.7–7.7)
Neutrophils Relative %: 43 %
Platelets: 372 10*3/uL (ref 150–400)
RBC: 4.93 MIL/uL (ref 3.87–5.11)
RDW: 12.3 % (ref 11.5–15.5)
WBC: 8.8 10*3/uL (ref 4.0–10.5)
nRBC: 0 % (ref 0.0–0.2)

## 2022-06-27 LAB — COMPREHENSIVE METABOLIC PANEL
ALT: 32 U/L (ref 0–44)
AST: 41 U/L (ref 15–41)
Albumin: 3.8 g/dL (ref 3.5–5.0)
Alkaline Phosphatase: 36 U/L — ABNORMAL LOW (ref 38–126)
Anion gap: 9 (ref 5–15)
BUN: 10 mg/dL (ref 6–20)
CO2: 23 mmol/L (ref 22–32)
Calcium: 8.8 mg/dL — ABNORMAL LOW (ref 8.9–10.3)
Chloride: 106 mmol/L (ref 98–111)
Creatinine, Ser: 0.59 mg/dL (ref 0.44–1.00)
GFR, Estimated: >60 mL/min (ref >60)
Glucose, Bld: 94 mg/dL (ref 70–99)
Potassium: 4.6 mmol/L (ref 3.5–5.1)
Sodium: 138 mmol/L (ref 135–145)
Total Bilirubin: 1.1 mg/dL (ref 0.3–1.2)
Total Protein: 7.3 g/dL (ref 6.5–8.1)

## 2022-06-27 LAB — I-STAT BETA HCG BLOOD, ED (MC, WL, AP ONLY): I-stat hCG, quantitative: <5 m[IU]/mL (ref <5)

## 2022-06-27 LAB — LIPASE, BLOOD: Lipase: 32 U/L (ref 11–51)

## 2022-06-27 MED ORDER — ONDANSETRON HCL 4 MG/2ML IJ SOLN
4.0000 mg | Freq: Once | INTRAMUSCULAR | Status: AC
  Administered 2022-06-27: 4 mg via INTRAVENOUS
  Filled 2022-06-27: qty 2

## 2022-06-27 MED ORDER — DICYCLOMINE HCL 10 MG/5ML PO SOLN
10.0000 mg | Freq: Once | ORAL | Status: AC
  Administered 2022-06-27: 10 mg via ORAL
  Filled 2022-06-27: qty 5

## 2022-06-27 MED ORDER — HYOSCYAMINE SULFATE 0.125 MG SL SUBL
0.2500 mg | SUBLINGUAL_TABLET | Freq: Once | SUBLINGUAL | Status: AC
  Administered 2022-06-27: 0.25 mg via SUBLINGUAL
  Filled 2022-06-27: qty 2

## 2022-06-27 MED ORDER — CEPHALEXIN 500 MG PO CAPS: 1000.0000 mg | ORAL_CAPSULE | Freq: Two times a day (BID) | ORAL | 0 refills | Status: DC

## 2022-06-27 MED ORDER — KETOROLAC TROMETHAMINE 30 MG/ML IJ SOLN
30.0000 mg | Freq: Once | INTRAMUSCULAR | Status: AC
  Administered 2022-06-27: 30 mg via INTRAVENOUS
  Filled 2022-06-27: qty 1

## 2022-06-27 MED ORDER — ALUM & MAG HYDROXIDE-SIMETH 200-200-20 MG/5ML PO SUSP
30.0000 mL | Freq: Once | ORAL | Status: AC
  Administered 2022-06-27: 30 mL via ORAL
  Filled 2022-06-27: qty 30

## 2022-06-27 MED ORDER — CEPHALEXIN 500 MG PO CAPS
1000.0000 mg | ORAL_CAPSULE | Freq: Once | ORAL | Status: AC
  Administered 2022-06-27: 1000 mg via ORAL
  Filled 2022-06-27: qty 2

## 2022-06-27 MED ORDER — MORPHINE SULFATE (PF) 4 MG/ML IV SOLN
4.0000 mg | Freq: Once | INTRAVENOUS | Status: AC
  Administered 2022-06-27: 4 mg via INTRAVENOUS
  Filled 2022-06-27: qty 1

## 2022-06-27 MED ORDER — IOHEXOL 300 MG/ML  SOLN
100.0000 mL | Freq: Once | INTRAMUSCULAR | Status: AC | PRN
  Administered 2022-06-27: 100 mL via INTRAVENOUS

## 2022-06-27 NOTE — ED Triage Notes (Signed)
Patient is here for evaluation of epigastric pain that is going down into the right lower abdomen. Reports going on since Saturday. Pt states she has been having nausea, no vomiting.

## 2022-06-27 NOTE — ED Provider Notes (Cosign Needed Addendum)
[facility] Provider Note   CSN: [id number] Arrival date & time: 06/27/22  1548     History  Chief Complaint  Patient presents with   Abdominal Pain   HPI [name] [name] is a 37 y.o. female with GERD, chronic abdominal pain, and fibromyalgia presenting for abdominal pain.  Started on Saturday.  It is epigastric pain that radiates to her back or right upper quadrant and right lower quadrant.  It is sharp.  Endorses nausea but no vomiting or diarrhea.  Last bowel movement was this morning.  Denies urinary changes.  Denies abnormal vaginal discharge or bleeding.   Abdominal Pain      Home Medications Prior to Admission medications   Medication Sig Start Date End Date Taking? Authorizing Provider  cephALEXin (KEFLEX) 500 MG capsule Take 2 capsules (1,000 mg total) by mouth 2 (two) times daily. 06/27/22  Yes Harriet Pho, PA-C  acetaminophen (TYLENOL) 325 MG tablet Take 650 mg by mouth every 6 (six) hours as needed for moderate pain.    [provider]  albuterol (VENTOLIN HFA) 108 (90 Base) MCG/ACT inhaler Inhale 2 puffs into the lungs every 6 (six) hours as needed for wheezing or shortness of breath. 04/21/22   Terrilyn Saver, NP  benzonatate (TESSALON) 100 MG capsule Take 1 capsule (100 mg total) by mouth 2 (two) times daily as needed for cough. 04/13/22   Bonnita Hollow, MD  dicyclomine (BENTYL) 20 MG tablet Take 1 tablet (20 mg total) by mouth 4 (four) times daily -  before meals and at bedtime. 03/31/22   Truddie Hidden, MD  fluticasone (FLONASE) 50 MCG/ACT nasal spray Place 2 sprays into both nostrils daily. 04/13/22   Bonnita Hollow, MD  ibuprofen (ADVIL) 600 MG tablet Take 1 tablet (600 mg total) by mouth every 8 (eight) hours as needed. 04/13/22   Bonnita Hollow, MD  ondansetron (ZOFRAN-ODT) 4 MG disintegrating tablet Take 1 tablet (4 mg total) by mouth every 8 (eight) hours as needed for nausea or vomiting.  03/31/22   Truddie Hidden, MD  oseltamivir (TAMIFLU) 75 MG capsule Take 1 capsule (75 mg total) by mouth 2 (two) times daily. 04/13/22   Bonnita Hollow, MD      Allergies    Gabapentin, Cinnamon, and Hydrocodone    Review of Systems   Review of Systems  Gastrointestinal:  Positive for abdominal pain.    Physical Exam   Vitals:   06/27/22 1930 06/27/22 2000  BP: 120/66 114/66  Pulse: 97 73  Resp: 20 17  Temp: 98.1 F (36.7 C)   SpO2: 99% 97%    CONSTITUTIONAL:  well-appearing, NAD NEURO:  Alert and oriented x 3, CN 3-12 grossly intact EYES:  eyes equal and reactive ENT/NECK:  Supple, no stridor  CARDIO:  regular rate and  rhythm, appears well-perfused  PULM:  No respiratory distress, CTAB GI/GU:  non-distended, soft, generalized tenderness MSK/SPINE:  No gross deformities, no edema, moves all extremities  SKIN:  no rash, atraumatic  *Additional and/or pertinent findings included in MDM below   ED Results / Procedures / Treatments   Labs (all labs ordered are listed, but only abnormal results are displayed) Labs Reviewed  CBC WITH DIFFERENTIAL/PLATELET - Abnormal; Notable for the following components:      Result Value   Lymphs Abs 4.1 (*)    All other components within normal limits  COMPREHENSIVE METABOLIC PANEL - Abnormal; Notable for the  following components:   Calcium 8.8 (*)    Alkaline Phosphatase 36 (*)    All other components within normal limits  URINALYSIS, ROUTINE W REFLEX MICROSCOPIC - Abnormal; Notable for the following components:   APPearance CLOUDY (*)    Leukocytes,Ua MODERATE (*)    Bacteria, UA RARE (*)    All other components within normal limits  LIPASE, BLOOD  I-STAT BETA HCG BLOOD, ED ([facility], [facility], [facility] ONLY)  I-STAT BETA HCG BLOOD, ED ([facility], [facility], [facility] ONLY)    EKG None  Radiology CT Abdomen Pelvis W Contrast  Result Date: 06/27/2022 CLINICAL DATA:  Acute abdominal pain/epigastric pain with nausea EXAM: CT ABDOMEN AND PELVIS WITH  CONTRAST TECHNIQUE: Multidetector CT imaging of the abdomen and pelvis was performed using the standard protocol following bolus administration of intravenous contrast. RADIATION DOSE REDUCTION: This exam was performed according to the departmental dose-optimization program which includes automated exposure control, adjustment of the mA and/or kV according to patient size and/or use of iterative reconstruction technique. CONTRAST:  155mL OMNIPAQUE IOHEXOL 300 MG/ML  SOLN COMPARISON:  Abdominal ultrasound 03/31/2022 and MRI pelvis 06/13/2017 FINDINGS: Lower chest: Mild cardiomegaly. Linear subsegmental atelectasis in the posterior basal segment right lower lobe. Hepatobiliary: Diffuse hepatic steatosis. Mild sparing along the gallbladder fossa. No biliary dilatation. Pancreas: Unremarkable Spleen: Unremarkable Adrenals/Urinary Tract: 7 mm hypodense lesion in the right kidney upper pole on image 45 series 5 measures 32 Hounsfield units in density, potentially a small complex cyst or small mass. This lesion is technically indeterminate. Renal protocol MRI with and without contrast is recommended for definitive characterization. Adrenal glands normal. No urinary tract calculi. Urinary bladder appears normal. Stomach/Bowel: Equivocal stranding adjacent to the descending duodenum for example on images 34-37 of series 2, which could be from local inflammation such as duodenitis or groove pancreatitis. Correlate with lipase levels. Fatty deposition in the wall of the ascending colon, typically incidental although this can have weak association with inflammatory bowel disease. Normal appendix. No dilated bowel. Vascular/Lymphatic: Unremarkable Reproductive: Unremarkable Other: No supplemental non-categorized findings. Musculoskeletal: Unremarkable IMPRESSION: 1. Equivocal stranding adjacent to the descending duodenum, which could be from local inflammation such as duodenitis or groove pancreatitis. Correlate with lipase  levels. 2. Diffuse hepatic steatosis. 3. 7 mm complex lesion in the right kidney upper pole, potentially a small complex cyst or small mass. This lesion is technically indeterminate on today's exam. Non-urgent renal protocol MRI with and without contrast is recommended for definitive characterization. 4. Mild cardiomegaly. 5. Fatty deposition in the wall of the ascending colon, typically incidental although this can have weak association with inflammatory bowel disease. Electronically Signed   By: Van Clines M.D.   On: 06/27/2022 19:15    Procedures Procedures    Medications Ordered in ED Medications  morphine (PF) 4 MG/ML injection 4 mg (4 mg Intravenous Given 06/27/22 1711)  ondansetron (ZOFRAN) injection 4 mg (4 mg Intravenous Given 06/27/22 1711)  alum & mag hydroxide-simeth (MAALOX/MYLANTA) 200-200-20 MG/5ML suspension 30 mL (30 mLs Oral Given 06/27/22 1710)  hyoscyamine (LEVSIN SL) SL tablet 0.25 mg (0.25 mg Sublingual Given 06/27/22 1710)  dicyclomine (BENTYL) 10 MG/5ML solution 10 mg (10 mg Oral Given 06/27/22 1856)  iohexol (OMNIPAQUE) 300 MG/ML solution 100 mL (100 mLs Intravenous Contrast Given 06/27/22 1844)  cephALEXin (KEFLEX) capsule 1,000 mg (1,000 mg Oral Given 06/27/22 1930)  ketorolac (TORADOL) 30 MG/ML injection 30 mg (30 mg Intravenous Given 06/27/22 2017)    ED Course/ Medical Decision Making/ A&P  Medical Decision Making Amount and/or Complexity of Data Reviewed Labs: ordered. Radiology: ordered.  Risk OTC drugs. Prescription drug management.   Initial Impression and Ddx 37 year old well-appearing female presenting for abdominal pain.  Exam remarkable for generalized abdominal tenderness.  DDx includes pancreatitis, acute cholecystitis, biliary colic, appendicitis, and diverticulitis. Patient PMH that increases complexity of ED encounter:  GERD, chronic abdominal pain, and fibromyalgia   Interpretation of Diagnostics -I  independent reviewed and interpreted the labs as followed: Pyuria  - I independently visualized the following imaging with scope of interpretation limited to determining acute life threatening conditions related to emergency care: CT abdomen pelvis, which revealed no acute findings but did reveal small mass in the right kidney  Patient Reassessment and Ultimate Disposition/Management Treated pain and administer GI cocktail.  Pain improved.  Treated with Toradol.  Patient stated that she has had chronic abdominal pain for 6 years and "no answers".  Advised to follow-up with GI.  Patient stated that she is wanting a "second opinion".  A friend told her about Dr. Watt Climes.  Submitted a referral to him at her request.  Vital stable at discharge.  Discussed return precautions.  Treated UTI with Keflex.  Advised patient of renal mass discovered on CT.  Advised to follow-up with PCP for these findings.  Patient management required discussion with the following services or consulting groups:  None  Complexity of Problems Addressed Acute complicated illness or Injury  Additional Data Reviewed and Analyzed Further history obtained from: Past medical history and medications listed in the EMR, Prior ED visit notes, and Recent discharge summary  Patient Encounter Risk Assessment None         Final Clinical Impression(s) / ED Diagnoses Final diagnoses:  Abdominal pain, unspecified abdominal location  Urinary tract infection without hematuria, site unspecified    Rx / DC Orders ED Discharge Orders          Ordered    cephALEXin (KEFLEX) 500 MG capsule  2 times daily        06/27/22 1959                Harriet Pho, PA-C 06/27/22 2028    Fransico Meadow, MD 06/28/22 0005

## 2022-06-27 NOTE — ED Notes (Signed)
Pt resting in bed with no acute distress at this time.  

## 2022-06-27 NOTE — ED Notes (Signed)
Patient back from CT.

## 2022-06-27 NOTE — Discharge Instructions (Addendum)
Evaluation for your abdominal pain was overall reassuring.  Workup did reveal that you do have a UTI.  I am treating with Keflex.  Also submitted a referral to Dr. Watt Climes for your ongoing abdominal pain.  Please schedule an appointment at your convenience.  If you have worsening abdominal pain, nausea vomiting diarrhea, blood in your stool, vomit or urine or any other concerning symptom please return the emergency department further evaluation.

## 2022-06-28 ENCOUNTER — Ambulatory Visit: Payer: No Typology Code available for payment source | Admitting: Family Medicine

## 2022-06-28 ENCOUNTER — Encounter: Payer: Self-pay | Admitting: Family Medicine

## 2022-06-28 VITALS — BP 132/84 | HR 79 | Temp 98.0°F | Ht 62.0 in | Wt 271.4 lb

## 2022-06-28 DIAGNOSIS — G8929 Other chronic pain: Secondary | ICD-10-CM

## 2022-06-28 DIAGNOSIS — N2889 Other specified disorders of kidney and ureter: Secondary | ICD-10-CM | POA: Diagnosis not present

## 2022-06-28 DIAGNOSIS — K2980 Duodenitis without bleeding: Secondary | ICD-10-CM

## 2022-06-28 DIAGNOSIS — R109 Unspecified abdominal pain: Secondary | ICD-10-CM

## 2022-06-28 MED ORDER — DICYCLOMINE HCL 20 MG PO TABS: 20.0000 mg | ORAL_TABLET | Freq: Three times a day (TID) | ORAL | 1 refills | Status: DC

## 2022-06-28 MED ORDER — FLUCONAZOLE 150 MG PO TABS: ORAL_TABLET | ORAL | 0 refills | Status: DC

## 2022-06-28 MED ORDER — PREDNISONE 20 MG PO TABS: 60.0000 mg | ORAL_TABLET | Freq: Every day | ORAL | 0 refills | Status: AC

## 2022-06-28 NOTE — Patient Instructions (Signed)
Please keep calling Dr. Watt Climes.  Let me know in a few days if the medicine is not helping your pain.   Let us know if you need anything.

## 2022-06-28 NOTE — Progress Notes (Signed)
Chief Complaint  Patient presents with   ER followup    Results from CT    Subjective: Patient is a 37 y.o. female here for ER f/u.  The patient is dealing with a 6-year history of chronic abdominal pain.  She had a flare starting 4 days ago.  She is having epigastric abdominal pain.  CT scan showed possible duodenitis versus pancreatitis.  Lipase was unremarkable.  She reports being told nothing was happening and to follow-up with [place].  Also the CT scan was a 7 mm lesion on her right kidney.  MRI recommended.  She would like to go over her lab work and imaging step-by-step.  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 No accessory muscle use Psych: Age appropriate judgment and insight, normal affect and mood-she did become tearful during exam due to frustration with her situation where she feels like people are not listening to her and she is continuing to have medical problems.  Assessment and Plan: Duodenitis - Plan: predniSONE (DELTASONE) 20 MG tablet, Ambulatory referral to Gastroenterology  Other specified disorders of  kidney and ureter - Plan: MR Abdomen W Wo Contrast  Chronic abdominal pain - Plan: dicyclomine (BENTYL) 20 MG tablet  She has been taking omeprazole so checking for H. pylori in our lab is not reasonable.  Will place urgent referral to gastroenterology, Dr. Watt Climes, she probably needs a scope.  CT showed some fat in the ascending colon concerning for IBD.  Will do a 10-day prednisone burst of 60 mg daily.  If this does not help, would consider transitioning to Protonix 40 mg daily. Check MRI renal protocol with and without contrast.  We did discuss possible etiologies of this. Refilled Bentyl as this does offer some temporary relief. The patient voiced understanding and agreement to the plan.  I spent 40 minutes with the patient discussing the above plans in addition to reviewing her chart individually and with her on the same day of the visit.  [place], DO 06/28/22  4:45 PM

## 2022-07-08 ENCOUNTER — Ambulatory Visit (HOSPITAL_BASED_OUTPATIENT_CLINIC_OR_DEPARTMENT_OTHER)
Admission: RE | Admit: 2022-07-08 | Discharge: 2022-07-08 | Disposition: A | Discharge status: Home / Self Care | Payer: No Typology Code available for payment source | Source: Ambulatory Visit | Attending: Family Medicine | Admitting: Family Medicine

## 2022-07-08 DIAGNOSIS — N2889 Other specified disorders of kidney and ureter: Secondary | ICD-10-CM | POA: Diagnosis not present

## 2022-07-08 MED ORDER — GADOBUTROL 1 MMOL/ML IV SOLN
7.5000 mL | Freq: Once | INTRAVENOUS | Status: AC | PRN
  Administered 2022-07-08: 7.5 mL via INTRAVENOUS

## 2022-09-13 ENCOUNTER — Encounter: Payer: Self-pay | Admitting: Family Medicine

## 2022-09-13 ENCOUNTER — Other Ambulatory Visit: Payer: Self-pay | Admitting: Family Medicine

## 2022-09-13 ENCOUNTER — Other Ambulatory Visit (HOSPITAL_BASED_OUTPATIENT_CLINIC_OR_DEPARTMENT_OTHER): Payer: Self-pay

## 2022-09-13 ENCOUNTER — Ambulatory Visit: Payer: No Typology Code available for payment source | Admitting: Family Medicine

## 2022-09-13 DIAGNOSIS — Z6841 Body Mass Index (BMI) 40.0 and over, adult: Secondary | ICD-10-CM | POA: Diagnosis not present

## 2022-09-13 MED ORDER — ZEPBOUND 10 MG/0.5ML ~~LOC~~ SOAJ
10.0000 mg | SUBCUTANEOUS | 0 refills | Status: DC
Start: 2022-12-06 — End: 2022-09-13
  Filled 2022-09-13: qty 2, 28d supply, fill #0

## 2022-09-13 MED ORDER — OZEMPIC (0.25 OR 0.5 MG/DOSE) 2 MG/3ML ~~LOC~~ SOPN
PEN_INJECTOR | SUBCUTANEOUS | 1 refills | Status: DC
  Filled 2022-09-13: qty 3, 28d supply, fill #0

## 2022-09-13 MED ORDER — ZEPBOUND 5 MG/0.5ML ~~LOC~~ SOAJ
5.0000 mg | SUBCUTANEOUS | 0 refills | Status: DC
  Filled 2022-09-13: qty 2, 28d supply, fill #0

## 2022-09-13 MED ORDER — ZEPBOUND 7.5 MG/0.5ML ~~LOC~~ SOAJ
7.5000 mg | SUBCUTANEOUS | 0 refills | Status: DC
  Filled 2022-09-13: qty 2, 28d supply, fill #0

## 2022-09-13 MED ORDER — ZEPBOUND 2.5 MG/0.5ML ~~LOC~~ SOAJ
2.5000 mg | SUBCUTANEOUS | 0 refills | Status: DC
  Filled 2022-09-13: qty 2, 28d supply, fill #0

## 2022-09-13 NOTE — Progress Notes (Signed)
Chief Complaint  Patient presents with   Weight Loss    Subjective: Patient is a 37 y.o. female here for weight loss discussion.  Patient is continuing to gain weight.  She is very frustrated with her current situation.  She has never tried any prescription or over-the-counter medication to help lose weight.  Diet is fair, chronic abdominal pain limit when she is able to eat.  She does not exercise routinely due to her chronic abdominal pain.  Metabolic workup is unremarkable.  She has coworkers who are on injectable medications for weight loss and she is interested in starting this.  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 No accessory muscle use Psych: Age appropriate judgment and insight, normal affect and mood  Assessment and Plan: Morbid obesity (HCC) - Plan: tirzepatide (ZEPBOUND) 2.5 MG/0.5ML Pen, tirzepatide (ZEPBOUND) 5 MG/0.5ML Pen, tirzepatide (ZEPBOUND) 7.5 MG/0.5ML Pen, tirzepatide (ZEPBOUND) 10 MG/0.5ML Pen  Chronic, uncontrolled.  Start tirzepatide 2.5 mg weekly and steadily uptitrate.  We will send to  the pharmacy downstairs for hopes of better supply.  Warned about potential cost issues.  She may have to contact her insurance company if there is poor coverage.  Counseled on diet and exercise.  Wait 2 weeks before starting to ensure better supply.  Follow-up in 7 weeks to recheck. The patient voiced understanding and agreement to the plan.  Jilda Roche [place], DO 09/13/22  11:20 AM

## 2022-09-13 NOTE — Patient Instructions (Signed)
Let me know if there are cost issues.  Let me know if there are supply issues.  Keep the diet clean and stay active.  Let us know if you need anything.

## 2022-09-14 ENCOUNTER — Telehealth: Payer: Self-pay

## 2022-09-14 ENCOUNTER — Other Ambulatory Visit (HOSPITAL_BASED_OUTPATIENT_CLINIC_OR_DEPARTMENT_OTHER): Payer: Self-pay

## 2022-09-14 NOTE — Telephone Encounter (Signed)
Plz inform pt. We could try an oral medication that is more of a stimulant. We could not keep her on it for longer than 90 days, but it is likely much more affordable. I would want to cancel her 7 week f/u and sched her in 1 mo to recheck things if she opts for this. Ty.

## 2022-09-14 NOTE — Telephone Encounter (Signed)
PA initiated via Covermymeds; KEY: [id number]. Awaiting determination.   Note- Pt does not have type 2 diabetes.

## 2022-09-14 NOTE — Telephone Encounter (Signed)
PA denied. Per plan- not medically necessary- Pt doesn't have type 2 diabetes.

## 2022-09-15 ENCOUNTER — Telehealth: Payer: Self-pay

## 2022-09-15 ENCOUNTER — Other Ambulatory Visit (HOSPITAL_BASED_OUTPATIENT_CLINIC_OR_DEPARTMENT_OTHER): Payer: Self-pay

## 2022-09-15 MED ORDER — SEMAGLUTIDE-WEIGHT MANAGEMENT 2.4 MG/0.75ML ~~LOC~~ SOAJ
2.4000 mg | SUBCUTANEOUS | 0 refills | Status: DC
  Filled 2022-09-15 – 2023-01-09 (×2): qty 3, 28d supply, fill #0

## 2022-09-15 MED ORDER — TIRZEPATIDE 2.5 MG/0.5ML ~~LOC~~ SOAJ: 2.5000 mg | SUBCUTANEOUS | 0 refills | Status: DC

## 2022-09-15 MED ORDER — SEMAGLUTIDE-WEIGHT MANAGEMENT 1 MG/0.5ML ~~LOC~~ SOAJ
1.0000 mg | SUBCUTANEOUS | 0 refills | Status: AC
  Filled 2022-09-15 – 2022-11-13 (×2): qty 2, 28d supply, fill #0

## 2022-09-15 MED ORDER — SEMAGLUTIDE-WEIGHT MANAGEMENT 1.7 MG/0.75ML ~~LOC~~ SOAJ
1.7000 mg | SUBCUTANEOUS | 0 refills | Status: AC
  Filled 2022-09-15 – 2022-12-12 (×2): qty 3, 28d supply, fill #0

## 2022-09-15 MED ORDER — SEMAGLUTIDE-WEIGHT MANAGEMENT 0.25 MG/0.5ML ~~LOC~~ SOAJ
0.2500 mg | SUBCUTANEOUS | 0 refills | Status: AC
  Filled 2022-09-15: qty 2, 28d supply, fill #0

## 2022-09-15 MED ORDER — SEMAGLUTIDE-WEIGHT MANAGEMENT 0.5 MG/0.5ML ~~LOC~~ SOAJ
0.5000 mg | SUBCUTANEOUS | 0 refills | Status: AC
  Filled 2022-09-15 – 2022-10-12 (×2): qty 2, 28d supply, fill #0

## 2022-09-15 NOTE — Telephone Encounter (Signed)
That's the first one we tried. Will send Wegovy as we have not tried that yet.

## 2022-09-15 NOTE — Telephone Encounter (Signed)
Informed the patient of the denial. She now wants to try zepbound

## 2022-09-15 NOTE — Telephone Encounter (Signed)
See telephone note regarding [place] PA

## 2022-09-15 NOTE — Telephone Encounter (Signed)
PA initiated via Covermymeds; KEY: [id number]. Awaiting determination.

## 2022-09-15 NOTE — Telephone Encounter (Signed)
PA denied.    Denial: Not Medically Necessary per plan. She does not have type 2 diabetes.

## 2022-09-15 NOTE — Telephone Encounter (Signed)
Called informed of PA results/PCP response, She is still going to call her insurance company to clarification based on what she previously was told. She will call back and just pay out of pocket the cost for the Zepbound But will call back.

## 2022-09-15 NOTE — Telephone Encounter (Signed)
She spoke to her insurance and would like to do [place] She said that if in providers note that it is said "medicallly necessary" that hopefully it will be approved.

## 2022-09-15 NOTE — Telephone Encounter (Signed)
We can order but this probably won't turn out well as she does not have DM. Ty.

## 2022-09-16 ENCOUNTER — Other Ambulatory Visit (HOSPITAL_COMMUNITY): Payer: Self-pay

## 2022-09-16 ENCOUNTER — Telehealth: Payer: Self-pay

## 2022-09-16 NOTE — Telephone Encounter (Signed)
Patient Advocate Encounter  Prior authorization for [employer] 0.25MG /0.5ML auto-injectors submitted and APPROVED through [employer].  Test billing returns $24.99 copay for 28 day supply.   Key [id number] Effective: 09-16-2022 - 09-16-2023

## 2022-09-18 ENCOUNTER — Encounter (HOSPITAL_BASED_OUTPATIENT_CLINIC_OR_DEPARTMENT_OTHER): Payer: Self-pay

## 2022-09-18 ENCOUNTER — Other Ambulatory Visit (HOSPITAL_BASED_OUTPATIENT_CLINIC_OR_DEPARTMENT_OTHER): Payer: Self-pay

## 2022-09-18 NOTE — Telephone Encounter (Signed)
Patient informed of PA results.

## 2022-09-18 NOTE — Telephone Encounter (Signed)
  PA approved and patient made aware for [name]

## 2022-10-12 ENCOUNTER — Other Ambulatory Visit (HOSPITAL_BASED_OUTPATIENT_CLINIC_OR_DEPARTMENT_OTHER): Payer: Self-pay

## 2022-10-13 ENCOUNTER — Other Ambulatory Visit (HOSPITAL_BASED_OUTPATIENT_CLINIC_OR_DEPARTMENT_OTHER): Payer: Self-pay

## 2022-10-26 ENCOUNTER — Other Ambulatory Visit (HOSPITAL_BASED_OUTPATIENT_CLINIC_OR_DEPARTMENT_OTHER): Payer: Self-pay

## 2022-11-01 ENCOUNTER — Ambulatory Visit: Payer: No Typology Code available for payment source | Admitting: Family Medicine

## 2022-11-01 ENCOUNTER — Encounter: Payer: Self-pay | Admitting: Family Medicine

## 2022-11-01 DIAGNOSIS — G8929 Other chronic pain: Secondary | ICD-10-CM

## 2022-11-01 DIAGNOSIS — R109 Unspecified abdominal pain: Secondary | ICD-10-CM

## 2022-11-01 DIAGNOSIS — Z6841 Body Mass Index (BMI) 40.0 and over, adult: Secondary | ICD-10-CM | POA: Diagnosis not present

## 2022-11-01 MED ORDER — OMEPRAZOLE 20 MG PO CPDR: 20.0000 mg | DELAYED_RELEASE_CAPSULE | Freq: Every day | ORAL | 3 refills | Status: DC

## 2022-11-01 NOTE — Progress Notes (Signed)
Chief Complaint  Patient presents with   Follow-up    Medication     Subjective: Patient is a 37 y.o. female here for f/u.  Started on Wegovy 6-7 weeks ago. Currently on 0.5 mg/week. Reports compliance, no AE's. Has lost 12 lbs on her scale. Her chronic GI issues are also improved. Diet is healthy. Appetite and cravings are down. Starting to exercise again.   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 No accessory muscle use Psych: Age appropriate judgment and insight, normal affect and mood  Assessment and Plan: Morbid obesity (HCC)  Chronic abdominal pain - Plan: omeprazole (PRILOSEC) 20 MG capsule  Chronic, improving.  Continue dose escalation of Wegovy.  Counseled on diet and exercise.  Follow-up in 2 months for physical which is required for her insurance. The patient voiced understanding and agreement to the plan.  Jilda Roche [place], DO 11/01/22  12:17 PM

## 2022-11-01 NOTE — Patient Instructions (Signed)
Strong work with your weight loss.  Keep the diet clean and stay active.  Let us know if you need anything.

## 2022-11-02 ENCOUNTER — Other Ambulatory Visit (HOSPITAL_BASED_OUTPATIENT_CLINIC_OR_DEPARTMENT_OTHER): Payer: Self-pay

## 2022-11-13 ENCOUNTER — Other Ambulatory Visit (HOSPITAL_BASED_OUTPATIENT_CLINIC_OR_DEPARTMENT_OTHER): Payer: Self-pay

## 2022-11-16 ENCOUNTER — Encounter (INDEPENDENT_AMBULATORY_CARE_PROVIDER_SITE_OTHER): Payer: Self-pay

## 2022-12-06 ENCOUNTER — Encounter: Payer: No Typology Code available for payment source | Admitting: Family Medicine

## 2022-12-12 ENCOUNTER — Other Ambulatory Visit (HOSPITAL_BASED_OUTPATIENT_CLINIC_OR_DEPARTMENT_OTHER): Payer: Self-pay

## 2022-12-14 ENCOUNTER — Ambulatory Visit: Payer: No Typology Code available for payment source | Admitting: Family Medicine

## 2022-12-14 ENCOUNTER — Encounter: Payer: Self-pay | Admitting: Family Medicine

## 2022-12-14 ENCOUNTER — Ambulatory Visit (HOSPITAL_BASED_OUTPATIENT_CLINIC_OR_DEPARTMENT_OTHER)
Admission: RE | Admit: 2022-12-14 | Discharge: 2022-12-14 | Disposition: A | Discharge status: Home / Self Care | Payer: No Typology Code available for payment source | Source: Ambulatory Visit | Attending: Family Medicine | Admitting: Family Medicine

## 2022-12-14 VITALS — BP 137/63 | HR 76 | Ht 62.0 in | Wt 259.0 lb

## 2022-12-14 DIAGNOSIS — M25562 Pain in left knee: Secondary | ICD-10-CM

## 2022-12-14 MED ORDER — MELOXICAM 15 MG PO TABS: 15.0000 mg | ORAL_TABLET | Freq: Every day | ORAL | 0 refills | Status: DC

## 2022-12-14 NOTE — Progress Notes (Signed)
Acute Office Visit  Subjective:     Patient ID: [name] [name], female    DOB: September 11, 1985, 37 y.o.   MRN: [id number]  Chief Complaint  Patient presents with   Knee Pain    Patient is in today for left knee pain.   Discussed the use of AI scribe software for clinical note transcription with the patient, who gave verbal consent to proceed.  History of Present Illness   The patient presents with a 50-month history of left knee pain, which has progressively worsened over time. Initially, the discomfort was intermittent and mild, alleviated by rest and elevation. However, in recent weeks, the pain has become persistent and more severe, interfering with the patient's daily activities, including work and exercise. The patient reports no known injury or trauma to the knee.  The pain is described as a constant ache, rated at a 5 out of 10 at rest. It is localized to the front and inside of the knee, with particular tenderness below the kneecap and on the inner side. The patient also reports a sensation of clicking or popping in the knee, which is a new symptom. The pain is exacerbated by bending the knee, walking, and particularly by turning movements, such as stepping out of the car or shower. Walking initially increases the discomfort, but it lessens slightly after a few minutes as the knee "loosens up." The patient also notes feeling unsteady at times, possibly due to favoring the affected knee.  The patient has attempted to manage the pain with ice and over-the-counter topical analgesics, with uncertain effectiveness. The patient has not noticed any swelling or bruising in the knee, and there is no history of similar symptoms in the right knee. The patient denies any significant past injuries to the left knee.            ROS All review of systems negative except what is listed in the HPI      Objective:    BP 137/63   Pulse 76   Ht 5\' 2"  (1.575 m)   Wt 259 lb (117.5 kg)   SpO2  100%   BMI 47.37 kg/m    Physical Exam Vitals reviewed.  Constitutional:      Appearance: She is obese.  Cardiovascular:     Rate and Rhythm: Normal rate and regular rhythm.  Musculoskeletal:        General: No swelling.     Comments: Some tenderness to palpation of patellar tendon and posterio/medially. Normal ROM. Some posterior pain with full extension  Neurological:     Mental Status: She is alert and oriented to person, place, and time.  Psychiatric:        Mood and Affect: Mood normal.        Behavior: Behavior normal.        Thought Content: Thought content normal.        Judgment: Judgment normal.         No results found for any visits on 12/14/22.      Assessment & Plan:   Problem List Items Addressed This Visit   None Visit Diagnoses     Acute pain of left knee    -  Primary   Relevant Medications   meloxicam (MOBIC) 15 MG tablet   Other Relevant Orders   DG Knee 1-2 Views Left   Ambulatory referral to Sports Medicine         Left Knee Pain Pain for a few months, worsening over  the past few weeks. No known injury or trauma. Pain is persistent, worse with bending and turning, and improves with elevation. Pain is located anteromedially, below the kneecap. No swelling or bruising. Clicking and popping noted. No instability, but patient reports feeling unsteady at times. -Order knee x-ray. -Start Meloxicam for anti-inflammatory effect. -Continue ice and gentle stretching. -Refer to sports medicine for further evaluation           Meds ordered this encounter  Medications   meloxicam (MOBIC) 15 MG tablet    Sig: Take 1 tablet (15 mg total) by mouth daily.    Dispense:  30 tablet    Refill:  0    Order Specific Question:   Supervising Provider    Answer:   Danise Edge A [[id number]]    Return if symptoms worsen or fail to improve.  Clayborne Dana, NP

## 2022-12-18 ENCOUNTER — Other Ambulatory Visit: Payer: Self-pay

## 2022-12-18 ENCOUNTER — Ambulatory Visit (INDEPENDENT_AMBULATORY_CARE_PROVIDER_SITE_OTHER): Payer: No Typology Code available for payment source | Admitting: Family Medicine

## 2022-12-18 VITALS — BP 114/82 | HR 90 | Ht 62.0 in | Wt 258.0 lb

## 2022-12-18 DIAGNOSIS — G8929 Other chronic pain: Secondary | ICD-10-CM | POA: Diagnosis not present

## 2022-12-18 DIAGNOSIS — M25562 Pain in left knee: Secondary | ICD-10-CM | POA: Diagnosis not present

## 2022-12-18 NOTE — Patient Instructions (Addendum)
Thank you for coming in today.   You received an injection today. Seek immediate medical attention if the joint becomes red, extremely painful, or is oozing fluid.   I've referred you to Physical Therapy.  Let us know if you don't hear from them in one week.   Please use Voltaren gel (Generic Diclofenac Gel) up to 4x daily for pain as needed.  This is available over-the-counter as both the name brand Voltaren gel and the generic diclofenac gel.

## 2022-12-18 NOTE — Progress Notes (Signed)
Rubin Payor, PhD, LAT, ATC acting as a scribe for Clementeen Graham, MD.  [name] [name] is a 37 y.o. female who presents to [facility] at [facility] today for L knee pain that's chronic in nature, worsening over the last few wks, becoming more persistent and severe. No injury or falls. Pt locates pain to the anterior-medial and posterior aspect of her L knee  L Knee swelling: no Mechanical symptoms: yes Aggravates: rotational motions, changing directions, full knee extension, transitioning to stand/walk Treatments tried: ice, topical creams, meloxicam, Tylenol, IBU  Dx imaging: 12/14/22 L knee XR  Pertinent review of systems: No fevers or chills  Relevant historical information: Obesity and depression.   Exam:  BP 114/82   Pulse 90   Ht 5\' 2"  (1.575 m)   Wt 258 lb (117 kg)   SpO2 96%   BMI 47.19 kg/m  General: Well Developed, well nourished, and in no acute distress.   MSK: Left knee normal-appearing Normal motion with crepitations. Tender palpation anterior to medial joint line. Stable ligamentous exam. Intact strength. Positive McMurray's test.    Lab and Radiology Results  Procedure: Real-time Ultrasound Guided Injection of left knee joint superior lateral patella space Device: Philips Affiniti 50G/GE Logiq Images permanently stored and available for review in PACS Ultrasound evaluation prior to injection shows no significant joint effusion.  Medial meniscus is not well-visualized.  No obvious tear.  No Baker's cyst. Verbal informed consent obtained.  Discussed risks and benefits of procedure. Warned about infection, bleeding, hyperglycemia damage to structures among others. Patient expresses understanding and agreement Time-out conducted.   Noted no overlying erythema, induration, or other signs of local infection.   Skin prepped in a sterile fashion.   Local anesthesia: Topical Ethyl chloride.   With sterile technique and under real time  ultrasound guidance: 40 mg of Kenalog and 2 mL of Marcaine injected into knee joint. Fluid seen entering the joint capsule.   Completed without difficulty   Pain immediately resolved suggesting accurate placement of the medication.   Advised to call if fevers/chills, erythema, induration, drainage, or persistent bleeding.   Images permanently stored and available for review in the ultrasound unit.  Impression: Technically successful ultrasound guided injection.   X-ray images left knee obtained at PCP office on December 14, 2022 personally and independently interpreted today.   No significant DJD.  No acute fractures. Radiology overread is still pending.    Assessment and Plan: 37 y.o. female with left knee pain.  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  She does not have severe mechanical symptoms so immediate advanced imaging with MRI for surgery planning is not necessary at this time.  Plan for conservative management approach with steroid injection Voltaren gel and referral to PT.  Check back in about 6 weeks.  If not improving or if worsening in the interim could consider advanced imaging with MRI sooner.  She is actively losing weight.  Weight loss will be helpful for knee pain as well.  PDMP not reviewed this encounter. Orders Placed This Encounter  Procedures   [place] LIMITED JOINT SPACE STRUCTURES LOW LEFT(NO LINKED CHARGES)    Order Specific Question:   Reason for Exam (SYMPTOM  OR DIAGNOSIS REQUIRED)    Answer:   left knee pain    Order Specific Question:   Preferred imaging location?    Answer:   [facility] referral to Physical Therapy  Referral Priority:   Routine    Referral Type:   Physical Medicine    Referral Reason:   Specialty Services Required    Requested Specialty:   Physical Therapy    Number of Visits Requested:   1   No orders of the defined types were placed in this  encounter.    Discussed warning signs or symptoms. Please see discharge instructions. Patient expresses understanding.   The above documentation has been reviewed and is accurate and complete Clementeen Graham, M.D.

## 2022-12-28 ENCOUNTER — Ambulatory Visit: Payer: No Typology Code available for payment source | Admitting: Physical Therapy

## 2022-12-29 ENCOUNTER — Encounter: Payer: Self-pay | Admitting: Family Medicine

## 2022-12-29 ENCOUNTER — Ambulatory Visit (INDEPENDENT_AMBULATORY_CARE_PROVIDER_SITE_OTHER): Payer: No Typology Code available for payment source | Admitting: Family Medicine

## 2022-12-29 VITALS — BP 120/78 | HR 75 | Temp 98.9°F | Resp 16 | Ht 62.0 in | Wt 257.2 lb

## 2022-12-29 DIAGNOSIS — Z0000 Encounter for general adult medical examination without abnormal findings: Secondary | ICD-10-CM

## 2022-12-29 NOTE — Patient Instructions (Addendum)
Keep the diet clean and stay active.  Aim to do some physical exertion for 150 minutes per week. This is typically divided into 5 days per week, 30 minutes per day. The activity should be enough to get your heart rate up. Anything is better than nothing if you have time constraints.  Please get me a copy of your advanced directive form at your convenience.   Let us know if you need anything.  Stretching and range of motion exercises These exercises warm up your muscles and joints and improve the movement and flexibility of your knee. These exercises also help to relieve pain and stiffness.  Exercise A: Knee flexion, active Lie on your back with both knees straight. If this causes back discomfort, bend your uninjured knee so your foot is flat on the floor. Slowly slide your left / right heel back toward your buttocks until you feel a gentle stretch in the front of your knee or thigh. Stop if you have pain. Hold for3 seconds. Slowly slide your left / right heel back to the starting position. 10 total repetitions. Repeat 2 times. Complete this exercise 3 times a week.  Exercise B: Knee extension, sitting Sit with your left / right heel propped on a chair, a coffee table, or a footstool. Do not have anything under your knee to support it. Allow your leg muscles to relax, letting gravity straighten out your knee. You should feel a stretch behind your left / right knee. If told by your health care provide just above your kneecap. Hold this position for 3 seconds. Repeat for a total of 10 repetitions. Repeat 2 times. Complete this stretch 3 times a week.  Strengthening exercises These exercises build strength and endurance in your knee. Endurance is the ability to use your muscles for a long time, even after they get tired.  Exercise C: Quadriceps, isometric Lie on your back with your left / right leg extended and your other knee bent. Put a rolled towel or small pillow under your right/left  knee if told by your health care provider. Slowly tense the muscles in the front of your left / right thigh by pushing the back of your knee down. You should see your knee cap slide up toward your hip or see increased dimpling just above the knee. For 3 seconds, keep the muscle as tight as you can without increasing your pain. Relax the muscles slowly and completely. Repeat for 10 total repetitions. Repeat 2 times. Complete this exercise 3 times a week. Exercise D: Straight leg raises (quadriceps) Lie on your back with your left / right leg extended and your other knee bent. Tense the muscles in the front of your left / right thigh. You should see your kneecap slide up or see increased dimpling just above the knee. Keep these muscles tight as you raise your leg 4-6 inches (10-15 cm) off the floor. Hold this position for 3 seconds. Keep these muscles tense as you lower your leg. Relax the muscles slowly and completely. Repeat for a total of 10 repetitions. Repeat 2 times. Complete this exercise 3 times a week.  Exercise E: Hamstring curls On the floor or a bed, lie on your abdomen with your legs straight. Put a folded towel or small pillow under your left / right thigh, just above your kneecap. Slowly bend your left / right knee as far as you can without pain. Keep your hips flat against the floor or bed. Hold this position for 3  seconds. Slowly lower your leg to the starting position. Repeat for a total of 10 repetitions. Repeat 2 times. Complete this exercise 3 times per week.  Stretching exercises These exercises warm up your muscles and joints and improve the movement and flexibility of your knee. These exercises also help to relieve pain and stiffness.  Exercise A: Quadriceps, prone Lie on your abdomen on a firm surface, such as a bed or padded floor. Bend your left / right knee and hold your ankle. If you cannot reach your ankle or pant leg, loop a belt around your foot and grab the  belt instead. Gently pull your heel toward your buttocks. Your knee should not slide out to the side. You should feel a stretch in the front of your thigh and knee. Hold this position for 30 seconds. Repeat 2 times. Complete this stretch 3 times a week.  Exercise B: Hamstring, doorway Lie on your back in front of a doorway with your left / right leg resting against the wall and your other leg flat on the floor in the doorway. There should be a slight bend in your left / right knee. Straighten your left / right knee. You should feel a stretch behind your knee or thigh. If you do not feel that stretch, scoot your buttocks closer to the door. Hold this position for 30 seconds. Repeat 2 times. Complete this stretch 3 times a week.  Strengthening exercises These exercises build strength and endurance in your knee and leg muscles. Endurance is the ability to use your muscles for a long time, even after they get tired.   Exercise D: Wall slides (quadriceps) Lean your back against a smooth wall or door, and walk your feet out 18-24 inches (45-61 cm) from it. Place your feet hip-width apart. Slowly slide down the wall or door until your knees bend 90 degrees. Keep your knees over your heels, not over your toes. Keep your knees in line with your hips. Hold for 2 seconds. Stand up to rest for 60 seconds. Repeat 2 times. Complete this exercise 3 times a week.  Exercise E: Bridge (hip extensors) Lie on your back on a firm surface with your knees bent and your feet flat on the floor. Tighten your buttocks muscles and lift your bottom off the floor until your trunk is level with your thighs. Do not arch your back. You should feel the muscles working in your buttocks and the back of your thighs. Hold this position for 2 seconds. Slowly lower your hips to the starting position. Let your buttocks muscles relax completely between repetitions. Repeat 2 times. Complete this exercise 3 times a week.

## 2022-12-29 NOTE — Progress Notes (Signed)
Chief Complaint  Patient presents with   Annual Exam    Annual Exam     Well Woman [name] [name] is here for a complete physical.   Her last physical was >1 year ago.  Current diet: in general, a "healthy" diet. Current exercise: limited 2/2 knee pain. Contraception? Yes Fatigue out of ordinary? No Seatbelt? Yes Advanced directive? 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   MDD (major depressive disorder)    Migraine    Morbid obesity (HCC)    Numbness and tingling of left leg    cold sensation, painful     Past Surgical History:  Procedure Laterality Date   COLONOSCOPY  02/06/2018   by dr Barron Alvine   FISTULOTOMY N/A 06/28/2017   Procedure: FISTULOTOMY;  Surgeon: Romie Levee, MD;  Location: [facility];  Service: General;  Laterality: N/A;   INCISION AND DRAINAGE PERIRECTAL ABSCESS Left 12/21/2016   Procedure: PLACEMENT OF SETON INTO FISTULA  AND INCISION AND DRAINAGE OF PERIRECTAL ABCESS;  Surgeon: Rodman Pickle, MD;  Location: [facility];  Service: General;  Laterality: Left;   IRRIGATION AND DEBRIDEMENT BUTTOCKS Left 12/28/2015   Procedure: IRRIGATION AND DEBRIDEMENT BUTTOCKS ABSCESS CHRONIC;  Surgeon: Harriette Bouillon, MD;  Location: [facility];  Service: General;  Laterality: Left;   LAPAROSCOPIC BILATERAL SALPINGECTOMY Bilateral 03/09/2021   Procedure: LAPAROSCOPIC BILATERAL SALPINGECTOMY;  Surgeon: Hermina Staggers, MD;   Location: [facility];  Service: Gynecology;  Laterality: Bilateral;   LIGATION OF INTERNAL FISTULA TRACT N/A 03/08/2017   Procedure: LIGATION OF INTERNAL INTERSPHINCTERIC FISTULA TRACT;  Surgeon: Sheliah Hatch, De Blanch, MD;  Location: [facility];  Service: General;  Laterality: N/A;   TUBAL LIGATION      Medications  Current Outpatient Medications on File Prior to Visit  Medication Sig Dispense Refill   dicyclomine (BENTYL) 20 MG tablet Take 1 tablet (20 mg total) by mouth 4 (four) times daily -  before meals and at bedtime. 90 tablet 1   fluticasone (FLONASE) 50 MCG/ACT nasal spray Place 2 sprays into both nostrils daily. 16 g 6   meloxicam (MOBIC) 15 MG tablet Take 1 tablet (15 mg total) by mouth daily. 30 tablet 0   omeprazole (PRILOSEC) 20 MG capsule Take 1 capsule (20 mg total) by mouth daily. 90 capsule 3   Semaglutide-Weight Management 1.7 MG/0.75ML SOAJ Inject 1.7 mg into the skin once a week for 28 days. 3 mL 0   [START ON 01/09/2023] Semaglutide-Weight Management 2.4 MG/0.75ML SOAJ Inject 2.4 mg into the skin once a week for 28 days. 3 mL 0    Allergies Allergies  Allergen Reactions   Gabapentin     hallunciations   Cinnamon Other (See Comments)    Blisters on mouth and lips, coughing.     Hydrocodone Diarrhea    Review of Systems: Constitutional:  no unexpected weight changes Eye:  no recent  significant change in vision Ear/Nose/Mouth/Throat:  Ears:  no tinnitus or vertigo and no recent change in hearing Nose/Mouth/Throat:  no complaints of nasal congestion, no sore throat Cardiovascular: no chest pain Respiratory:  no cough and no shortness of breath Gastrointestinal:  no abdominal pain, no change in bowel habits GU:  Female: negative for dysuria or pelvic pain Musculoskeletal/Extremities: +knee pain Integumentary (Skin/Breast):  no abnormal skin lesions reported Neurologic:  no headaches Endocrine:  denies  fatigue Hematologic/Lymphatic:  No areas of easy bleeding  Exam BP 120/78 (BP Location: Left Arm, Patient Position: Sitting, Cuff Size: Normal)   Pulse 75   Temp 98.9 F (37.2 C) (Oral)   Resp 16   Ht 5\' 2"  (1.575 m)   Wt 257 lb 3.2 oz (116.7 kg)   SpO2 95%   BMI 47.04 kg/m  General:  well developed, well nourished, in no apparent distress Skin:  no significant moles, warts, or growths Head:  no masses, lesions, or tenderness Eyes:  pupils equal and round, sclera anicteric without injection Ears:  canals without lesions, TMs shiny without retraction, no obvious effusion, no erythema Nose:  nares patent, mucosa normal, and no drainage  Throat/Pharynx:  lips and gingiva without lesion; tongue and uvula midline; non-inflamed pharynx; no exudates or postnasal drainage Neck: neck supple without adenopathy, thyromegaly, or masses Lungs:  clear to auscultation, breath sounds equal bilaterally, no respiratory distress Cardio:  regular rate and rhythm, no bruits, no LE edema Abdomen:  abdomen soft, nontender; bowel sounds normal; no masses or organomegaly Genital: Defer to GYN Musculoskeletal:  symmetrical muscle groups noted without atrophy or deformity Extremities:  no clubbing, cyanosis, or edema, no deformities, no skin discoloration Neuro:  gait normal; deep tendon reflexes normal and symmetric Psych: well oriented with normal range of affect and appropriate judgment/insight  Assessment and Plan  Well adult exam   Well 37 y.o. female. Counseled on diet and exercise. Advanced directive form provided today.  Other orders as above. Follow up in 6 mo. The patient voiced understanding and agreement to the plan.  Jilda Roche [place], DO 12/29/22 1:29 PM

## 2023-01-09 ENCOUNTER — Other Ambulatory Visit (HOSPITAL_BASED_OUTPATIENT_CLINIC_OR_DEPARTMENT_OTHER): Payer: Self-pay

## 2023-01-10 ENCOUNTER — Ambulatory Visit: Payer: No Typology Code available for payment source | Attending: Family Medicine | Admitting: Physical Therapy

## 2023-01-26 NOTE — Progress Notes (Deleted)
   Rubin Payor, PhD, LAT, ATC acting as a scribe for Clementeen Graham, MD.  [name] [name] is a 37 y.o. female who presents to [facility] at [facility] today for f/u L knee pain. Pt was last seen by Dr. Denyse Amass on 12/18/22 and was given a L knee steroid injection and was advised to use Voltaren gel. She was also referred to PT, but canceled/no-showed x 2.   Today, pt reports ***  Dx imaging: 12/14/22 L knee XR   Pertinent review of systems: ***  Relevant historical information: ***   Exam:  There were no vitals taken for this visit. General: Well Developed, well nourished, and in no acute distress.   MSK: ***    Lab and Radiology Results No results found for this or any previous visit (from the past 72 hour(s)). No results found.     Assessment and Plan: 37 y.o. female with ***   PDMP not reviewed this encounter. No orders of the defined types were placed in this encounter.  No orders of the defined types were placed in this encounter.    Discussed warning signs or symptoms. Please see discharge instructions. Patient expresses understanding.   ***

## 2023-01-29 ENCOUNTER — Ambulatory Visit: Payer: No Typology Code available for payment source | Admitting: Family Medicine

## 2023-01-30 ENCOUNTER — Ambulatory Visit: Payer: No Typology Code available for payment source | Admitting: Family Medicine

## 2023-01-30 ENCOUNTER — Encounter: Payer: Self-pay | Admitting: Family Medicine

## 2023-01-30 VITALS — BP 130/76 | HR 86 | Temp 98.0°F | Resp 16 | Ht 62.0 in | Wt 256.6 lb

## 2023-01-30 DIAGNOSIS — N3001 Acute cystitis with hematuria: Secondary | ICD-10-CM | POA: Diagnosis not present

## 2023-01-30 MED ORDER — SULFAMETHOXAZOLE-TRIMETHOPRIM 800-160 MG PO TABS
1.0000 | ORAL_TABLET | Freq: Two times a day (BID) | ORAL | 0 refills | Status: AC
Start: 2023-01-30 — End: 2023-02-02

## 2023-01-30 MED ORDER — ONDANSETRON HCL 4 MG PO TABS: 4.0000 mg | ORAL_TABLET | Freq: Three times a day (TID) | ORAL | 0 refills | Status: DC | PRN

## 2023-01-30 MED ORDER — FLUCONAZOLE 150 MG PO TABS
ORAL_TABLET | ORAL | 0 refills | Status: DC
Start: 2023-01-30 — End: 2023-07-18

## 2023-01-30 NOTE — Patient Instructions (Signed)
Stay hydrated.   Warning signs/symptoms: Uncontrollable nausea/vomiting, fevers, worsening symptoms despite treatment, confusion.  Send me a message if this cycle is starting to last longer than 7 days.   Let us know if you need anything.

## 2023-01-30 NOTE — Progress Notes (Signed)
Chief Complaint  Patient presents with   Hematuria    Blood in urine    [name] [name] is a 37 y.o. female here for possible UTI.  Duration: 3 days. Symptoms: Dysuria, urinary frequency, hematuria, urinary hesitancy, urinary retention, and urgency Denies: fever, nausea, vomiting, and flank pain, vaginal discharge Hx of recurrent UTI? No Denies new sexual partners.  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   MDD (major depressive disorder)    Migraine    Morbid obesity (HCC)    Numbness and tingling of left leg    cold sensation, painful     BP 130/76 (BP Location: Left Arm, Patient Position: Sitting, Cuff Size: Normal)   Pulse 86   Temp 98 F (36.7 C) (Oral)   Resp 16   Ht 5\' 2"  (1.575 m)   Wt 256 lb 9.6 oz (116.4 kg)   SpO2 99%   BMI 46.93 kg/m  General: Awake, alert, appears stated age Heart: RRR Lungs: CTAB, normal respiratory effort, no accessory muscle usage Abd: BS+, soft, NT, ND, no masses or organomegaly MSK: No CVA tenderness, neg Lloyd's sign Psych: Age appropriate judgment and insight  Acute cystitis with hematuria - Plan: sulfamethoxazole-trimethoprim (BACTRIM DS) 800-160 MG tablet, fluconazole (DIFLUCAN) 150 MG tablet  S/s's of infection. 3 d of Bactrim, fluconazole prn. Stay hydrated. Seek immediate care if pt starts to develop fevers, new/worsening symptoms, uncontrollable N/V. F/u prn. The patient voiced understanding and agreement to the plan.  Jilda Roche [place], DO 01/30/23 4:27 PM

## 2023-02-08 ENCOUNTER — Other Ambulatory Visit: Payer: Self-pay | Admitting: Family Medicine

## 2023-02-08 ENCOUNTER — Encounter (HOSPITAL_BASED_OUTPATIENT_CLINIC_OR_DEPARTMENT_OTHER): Payer: Self-pay

## 2023-02-08 ENCOUNTER — Encounter: Payer: Self-pay | Admitting: Family Medicine

## 2023-02-08 ENCOUNTER — Other Ambulatory Visit (HOSPITAL_COMMUNITY): Payer: Self-pay

## 2023-02-08 ENCOUNTER — Other Ambulatory Visit (HOSPITAL_BASED_OUTPATIENT_CLINIC_OR_DEPARTMENT_OTHER): Payer: Self-pay

## 2023-02-08 MED ORDER — SEMAGLUTIDE-WEIGHT MANAGEMENT 2.4 MG/0.75ML ~~LOC~~ SOAJ
2.4000 mg | SUBCUTANEOUS | 5 refills | Status: DC
  Filled 2023-02-09: qty 3, 28d supply, fill #0
  Filled 2023-03-06: qty 3, 28d supply, fill #1
  Filled 2023-04-05: qty 3, 28d supply, fill #2
  Filled 2023-05-04: qty 3, 28d supply, fill #3
  Filled 2023-05-31: qty 3, 28d supply, fill #4
  Filled 2023-06-28: qty 3, 28d supply, fill #5

## 2023-02-09 ENCOUNTER — Other Ambulatory Visit (HOSPITAL_BASED_OUTPATIENT_CLINIC_OR_DEPARTMENT_OTHER): Payer: Self-pay

## 2023-03-09 ENCOUNTER — Other Ambulatory Visit (HOSPITAL_BASED_OUTPATIENT_CLINIC_OR_DEPARTMENT_OTHER): Payer: Self-pay

## 2023-03-09 ENCOUNTER — Other Ambulatory Visit: Payer: Self-pay

## 2023-04-06 ENCOUNTER — Other Ambulatory Visit (HOSPITAL_BASED_OUTPATIENT_CLINIC_OR_DEPARTMENT_OTHER): Payer: Self-pay

## 2023-04-11 ENCOUNTER — Ambulatory Visit: Payer: No Typology Code available for payment source | Admitting: Physician Assistant

## 2023-04-11 ENCOUNTER — Encounter: Payer: Self-pay | Admitting: Physician Assistant

## 2023-04-11 ENCOUNTER — Ambulatory Visit: Payer: Self-pay | Admitting: Family Medicine

## 2023-04-11 VITALS — BP 126/80 | HR 87 | Temp 97.7°F | Ht 62.0 in | Wt 241.5 lb

## 2023-04-11 DIAGNOSIS — R051 Acute cough: Secondary | ICD-10-CM

## 2023-04-11 DIAGNOSIS — H66002 Acute suppurative otitis media without spontaneous rupture of ear drum, left ear: Secondary | ICD-10-CM

## 2023-04-11 DIAGNOSIS — R6889 Other general symptoms and signs: Secondary | ICD-10-CM

## 2023-04-11 LAB — POCT INFLUENZA A/B
Influenza A, POC: NEGATIVE
Influenza B, POC: NEGATIVE

## 2023-04-11 LAB — POC COVID19 BINAXNOW: SARS Coronavirus 2 Ag: NEGATIVE

## 2023-04-11 MED ORDER — PROMETHAZINE-DM 6.25-15 MG/5ML PO SYRP: 5.0000 mL | ORAL_SOLUTION | Freq: Four times a day (QID) | ORAL | 0 refills | Status: DC | PRN

## 2023-04-11 MED ORDER — ALBUTEROL SULFATE HFA 108 (90 BASE) MCG/ACT IN AERS: 2.0000 | INHALATION_SPRAY | Freq: Four times a day (QID) | RESPIRATORY_TRACT | 2 refills | Status: DC | PRN

## 2023-04-11 MED ORDER — AMOXICILLIN-POT CLAVULANATE 875-125 MG PO TABS: 1.0000 | ORAL_TABLET | Freq: Two times a day (BID) | ORAL | 0 refills | Status: AC

## 2023-04-11 NOTE — Telephone Encounter (Signed)
 Copied from CRM [id number]. Topic: Clinical - Red Word Triage >> Apr 11, 2023  8:27 AM Joen NOVAK wrote: [facility] that prompted transfer to Nurse Triage: PATIENT COMPLAINS OF CONGESTION BODY ACHES AND FEVER   Chief Complaint: productive cough Symptoms: fatigue, body aches Frequency: ongoing 1 week Pertinent Negatives: Patient denies chest pain Disposition: [] ED /[] Urgent Care (no appt availability in office) / [x] Appointment(In office/virtual)/ []  [place] [facility]/ [] Home Care/ [] Refused Recommended Disposition /[] [place] [facility]/ []  Follow-up with PCP Additional Notes: The patient reported an ongoing productive cough, body aches, and fatigue for the past week.  Her symptoms worsened Sunday.  She reported taking shallow breaths to keep herself from coughing. She experiences shortness of breath when coughing and with exertion.  She was scheduled for a same day appointment for further evaluation.   Reason for Disposition  SEVERE coughing spells (e.g., whooping sound after coughing, vomiting after coughing)  Answer Assessment - Initial Assessment Questions 1. ONSET: When did the cough begin?      1 week; worse on Sunday  2. SEVERITY: How bad is the cough today?      Keeps patient awake and throughout the night  3. SPUTUM: Describe the color of your sputum (none, dry cough; clear, white, yellow, green)     Clear  4. HEMOPTYSIS: Are you coughing up any blood? If so ask: How much? (flecks, streaks, tablespoons, etc.)     none 5. DIFFICULTY BREATHING: Are you having difficulty breathing? If Yes, ask: How bad is it? (e.g., mild, moderate, severe)    - MILD: No SOB at rest, mild SOB with walking, speaks normally in sentences, can lie down, no retractions, pulse < 100.    - MODERATE: SOB at rest, SOB with minimal exertion and prefers to sit, cannot lie down flat, speaks in phrases, mild retractions, audible wheezing, pulse 100-120.    - SEVERE: Very SOB at rest, speaks in  single words, struggling to breathe, sitting hunched forward, retractions, pulse > 120      Short of breath when coughing moving around  6. FEVER: Do you have a fever? If Yes, ask: What is your temperature, how was it measured, and when did it start?     10 0.7  7. CARDIAC HISTORY: Do you have any history of heart disease? (e.g., heart attack, congestive heart failure)      None  8. LUNG HISTORY: Do you have any history of lung disease?  (e.g., pulmonary embolus, asthma, emphysema)     None  9. PE RISK FACTORS: Do you have a history of blood clots? (or: recent major surgery, recent prolonged travel, bedridden)     None 10. OTHER SYMPTOMS: Do you have any other symptoms? (e.g., runny nose, wheezing, chest pain)       Body aches, fatigue 11. PREGNANCY: Is there any chance you are pregnant? When was your last menstrual period?       No  Protocols used: Cough - Acute Productive-A-AH

## 2023-04-11 NOTE — Progress Notes (Signed)
 Established patient visit   Patient: [name] [name]   DOB: [birth year]   38 y.o. Female  MRN: [id number] Visit Date: 04/11/2023  Today's healthcare provider: Manuelita Flatness, PA-C   Chief Complaint  Patient presents with   Cough    Cough started last Wednesday but sore throat/congestion started Sunday- Bodyaches & headache. Has had a fever- OTC- coricidin    Subjective    Pt reports her cough started last week around Wednesday but symptoms have progressed over the weekend, dry cough, sinus congestion, ear pain, sore throat, fevers of tmax 100.90F, some trouble catching her breath/sob.  Taking ibuprofen  and corcidin otc, took her boyfriend's promethazine  which helped.   Medications: Outpatient Medications Prior to Visit  Medication Sig   dicyclomine  (BENTYL ) 20 MG tablet Take 1 tablet (20 mg total) by mouth 4 (four) times daily -  before meals and at bedtime.   fluconazole  (DIFLUCAN ) 150 MG tablet Take 1 tab, repeat in 72 hours if no improvement.   fluticasone  (FLONASE ) 50 MCG/ACT nasal spray Place 2 sprays into both nostrils daily.   meloxicam  (MOBIC ) 15 MG tablet Take 1 tablet (15 mg total) by mouth daily.   omeprazole  (PRILOSEC) 20 MG capsule Take 1 capsule (20 mg total) by mouth daily.   ondansetron  (ZOFRAN ) 4 MG tablet Take 1 tablet (4 mg total) by mouth every 8 (eight) hours as needed for nausea or vomiting.   Semaglutide -Weight Management 2.4 MG/0.75ML SOAJ Inject 2.4 mg into the skin once a week.   No facility-administered medications prior to visit.    Review of Systems  Constitutional:  Positive for fatigue and fever.  HENT:  Positive for congestion, rhinorrhea, sinus pressure, sinus pain and sore throat.   Respiratory:  Positive for cough and shortness of breath.   Cardiovascular:  Negative for chest pain and leg swelling.  Gastrointestinal:  Negative for abdominal pain.  Neurological:  Negative for dizziness and headaches.       Objective    BP 126/80    Pulse 87   Temp 97.7 F (36.5 C) (Oral)   Ht 5' 2 (1.575 m)   Wt 241 lb 8 oz (109.5 kg)   SpO2 97%   BMI 44.17 kg/m    Physical Exam Constitutional:      General: She is awake.     Appearance: She is well-developed.  HENT:     Head: Normocephalic.     Right Ear: Tympanic membrane normal.     Ears:     Comments: Left TM with significant erythema, bulging    Mouth/Throat:     Pharynx: Posterior oropharyngeal erythema present. No oropharyngeal exudate.  Eyes:     Conjunctiva/sclera: Conjunctivae normal.  Cardiovascular:     Rate and Rhythm: Normal rate and regular rhythm.     Heart sounds: Normal heart sounds.  Pulmonary:     Effort: Pulmonary effort is normal.     Breath sounds: Normal breath sounds. No wheezing, rhonchi or rales.  Skin:    General: Skin is warm.  Neurological:     Mental Status: She is alert and oriented to person, place, and time.  Psychiatric:        Attention and Perception: Attention normal.        Mood and Affect: Mood normal.        Speech: Speech normal.        Behavior: Behavior is cooperative.      Results for orders placed or performed in visit  on 04/11/23  POCT Influenza A/B  Result Value Ref Range   Influenza A, POC Negative Negative   Influenza B, POC Negative Negative  POC COVID-19  Result Value Ref Range   SARS Coronavirus 2 Ag Negative Negative    Assessment & Plan    Acute cough -     Promethazine -DM; Take 5 mLs by mouth 4 (four) times daily as needed.  Dispense: 118 mL; Refill: 0 -     Albuterol  Sulfate HFA; Inhale 2 puffs into the lungs every 6 (six) hours as needed for wheezing or shortness of breath.  Dispense: 8 g; Refill: 2  Non-recurrent acute suppurative otitis media of left ear without spontaneous rupture of tympanic membrane -     Amoxicillin -Pot Clavulanate; Take 1 tablet by mouth 2 (two) times daily for 7 days.  Dispense: 14 tablet; Refill: 0  Flu-like symptoms -     POCT Influenza A/B -     POC COVID-19  BinaxNow   Poc covid, flu negative. Rx augmentin  x 7 days  Recommend rest, fluids, cont otc meds. Rx albuterol  inhaler 1-2 puffs q 4-6 hours for sob, promethazine  for cough. Cautioned combo of otc and rx cough meds.   If symptoms do not fully improve in the day 5-7 days advised pt contact office for chest xray Return if symptoms worsen or fail to improve.       Manuelita Flatness, PA-C  [facility] [phone] (phone) [phone] (fax)  [facility]

## 2023-06-29 ENCOUNTER — Ambulatory Visit: Payer: No Typology Code available for payment source | Admitting: Family Medicine

## 2023-07-18 ENCOUNTER — Other Ambulatory Visit (HOSPITAL_BASED_OUTPATIENT_CLINIC_OR_DEPARTMENT_OTHER): Payer: Self-pay

## 2023-07-18 ENCOUNTER — Ambulatory Visit: Admitting: Family Medicine

## 2023-07-18 ENCOUNTER — Encounter: Payer: Self-pay | Admitting: Family Medicine

## 2023-07-18 MED ORDER — SEMAGLUTIDE-WEIGHT MANAGEMENT 2.4 MG/0.75ML ~~LOC~~ SOAJ
2.4000 mg | SUBCUTANEOUS | 2 refills | Status: DC
  Filled 2023-07-18: qty 9, 84d supply, fill #0
  Filled 2023-07-27: qty 3, 28d supply, fill #0
  Filled 2023-08-28: qty 3, 28d supply, fill #1
  Filled 2023-09-28 – 2023-10-01 (×2): qty 3, 28d supply, fill #2
  Filled 2023-11-23: qty 3, 28d supply, fill #3

## 2023-07-18 NOTE — Progress Notes (Signed)
 Chief Complaint  Patient presents with   Medication Refill    Medication Refill    Subjective: Patient is a 38 y.o. female here for f/u.  Pt on Wegovy 2.4 mg/week. Compliant, no AE's. Diet is fair. She is down around 38 lbs since starting the medicine. She is not exercising routinely.   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 No accessory muscle use Psych: Age appropriate judgment and insight, normal affect and mood  Assessment and Plan: Morbid obesity (HCC)  Chronic, needs to improve. Cont Wegovy 2.4 mg/week.  She has lost around 38 pounds since starting.  Counseled on diet/exercise. Rec'd she make some goals to help w motivation.  I will plan to see her in 6 months for physical or as needed. The patient voiced understanding and agreement to the plan.  Shellie Dials [place], DO 07/18/23  4:45 PM

## 2023-07-18 NOTE — Patient Instructions (Addendum)
 Keep the diet clean and stay active.  Aim to do some physical exertion for 150 minutes per week. This is typically divided into 5 days per week, 30 minutes per day. The activity should be enough to get your heart rate up. Anything is better than nothing if you have time constraints.  Please consider adding some weight resistance exercise to your routine. Consider yoga as well.   Develop some mid-term, short-term and long-term goals.   Let us  know if you need anything.

## 2023-07-27 ENCOUNTER — Other Ambulatory Visit (HOSPITAL_BASED_OUTPATIENT_CLINIC_OR_DEPARTMENT_OTHER): Payer: Self-pay

## 2023-08-28 ENCOUNTER — Other Ambulatory Visit (HOSPITAL_BASED_OUTPATIENT_CLINIC_OR_DEPARTMENT_OTHER): Payer: Self-pay

## 2023-09-28 ENCOUNTER — Other Ambulatory Visit (HOSPITAL_BASED_OUTPATIENT_CLINIC_OR_DEPARTMENT_OTHER): Payer: Self-pay

## 2023-10-01 ENCOUNTER — Telehealth: Payer: Self-pay

## 2023-10-01 ENCOUNTER — Other Ambulatory Visit (HOSPITAL_COMMUNITY): Payer: Self-pay

## 2023-10-01 ENCOUNTER — Other Ambulatory Visit (HOSPITAL_BASED_OUTPATIENT_CLINIC_OR_DEPARTMENT_OTHER): Payer: Self-pay

## 2023-10-01 NOTE — Telephone Encounter (Signed)
 Pharmacy Patient Advocate Encounter   Received notification from CoverMyMeds that prior authorization for Wegovy  2.4 is required/requested.   Insurance verification completed.   The patient is insured through [employer] .   Per test claim: PA required; PA submitted to above mentioned insurance via CoverMyMeds Key/confirmation #/[id number] Status is pending

## 2023-10-02 ENCOUNTER — Other Ambulatory Visit (HOSPITAL_COMMUNITY): Payer: Self-pay

## 2023-10-02 NOTE — Telephone Encounter (Signed)
 Pharmacy Patient Advocate Encounter  Received notification from [facility] [employer] that Prior Authorization for Wegovy  0.25 has been APPROVED from 10/01/23 to 09/30/24. Unable to obtain price due to refill too soon rejection, last fill date 10/01/23 next available fill date7/21/25   PA #/Case ID/Reference #: [id number]

## 2023-10-02 NOTE — Telephone Encounter (Signed)
 Message sent to Pt about Wegovy  0.25 has been APPROVED

## 2023-10-09 ENCOUNTER — Encounter: Payer: Self-pay | Admitting: Family Medicine

## 2023-10-09 ENCOUNTER — Other Ambulatory Visit (HOSPITAL_BASED_OUTPATIENT_CLINIC_OR_DEPARTMENT_OTHER): Payer: Self-pay

## 2023-10-09 ENCOUNTER — Ambulatory Visit: Payer: Self-pay | Admitting: Family Medicine

## 2023-10-09 ENCOUNTER — Ambulatory Visit: Admitting: Family Medicine

## 2023-10-09 VITALS — BP 126/80 | HR 88 | Temp 98.0°F | Resp 16 | Ht 62.0 in | Wt 238.0 lb

## 2023-10-09 DIAGNOSIS — H579 Unspecified disorder of eye and adnexa: Secondary | ICD-10-CM

## 2023-10-09 DIAGNOSIS — M545 Low back pain, unspecified: Secondary | ICD-10-CM

## 2023-10-09 LAB — BASIC METABOLIC PANEL WITH GFR
BUN: 14 mg/dL (ref 6–23)
CO2: 24 meq/L (ref 19–32)
Calcium: 8.6 mg/dL (ref 8.4–10.5)
Chloride: 106 meq/L (ref 96–112)
Creatinine, Ser: 0.69 mg/dL (ref 0.40–1.20)
GFR: 109.42 mL/min (ref >60.00)
Glucose, Bld: 88 mg/dL (ref 70–99)
Potassium: 4 meq/L (ref 3.5–5.1)
Sodium: 137 meq/L (ref 135–145)

## 2023-10-09 LAB — MAGNESIUM: Magnesium: 2.1 mg/dL (ref 1.5–2.5)

## 2023-10-09 MED ORDER — METHOCARBAMOL 500 MG PO TABS
500.0000 mg | ORAL_TABLET | Freq: Three times a day (TID) | ORAL | 0 refills | Status: DC | PRN
Start: 2023-10-09 — End: 2024-02-11
  Filled 2023-10-09: qty 21, 7d supply, fill #0

## 2023-10-09 NOTE — Patient Instructions (Signed)
 If you do not hear anything about your referral in the next 1-2 weeks, call our office and ask for an update.  Artificial tears like Refresh and Systane may be used for comfort. OK to get generic version. Generally people use them every 2-4 hours, but you can use them as much as you want because there is no medication in it.  Stay hydrated.  Give us  2-3 business days to get the results of your labs back.   Stop the Baclofen.   Let us  know if you need anything.

## 2023-10-09 NOTE — Progress Notes (Signed)
 Chief Complaint  Patient presents with   Eye Twitching    Eye Twitching    Subjective: Patient is a 38 y.o. female here for a twitching sensation of her eye.  The patient has felt intermittent twitches of the left eye over the past several weeks.  Interestingly, she hurt her back a couple days afterwards and stopped sleeping as well.  This seemed to exacerbate her symptoms significantly.  She denies any trauma to her eye, pain, redness, drainage, or sinus issues.  Slightly decreased/blurry vision over just the left eye.  When she looks forward with both eyes, she has no trouble.  She stays well-hydrated overall.  No change in her diet.  No medication changes.  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  There is no TTP over the globes with closed eyes. Lungs: No accessory muscle use Psych: Age appropriate judgment and insight, normal affect and mood  Assessment and Plan: Eye problem - Plan: Ambulatory referral to Ophthalmology, Basic metabolic panel with GFR, Magnesium   Acute low back pain, unspecified back pain laterality, unspecified  whether sciatica present  Check electrolytes.  Suspect twitching of the lid or ocular musculature which is exacerbated by lack of sleep.  We will change her muscle relaxer to Robaxin  to see if we can help her sleep and hopefully improve the eye issue.  Will refer to ophthalmology as a contingency. The patient voiced understanding and agreement to the plan.  Mabel Mt [place], DO 10/09/23  12:24 PM

## 2023-12-28 ENCOUNTER — Encounter: Payer: Self-pay | Admitting: Family Medicine

## 2023-12-31 ENCOUNTER — Other Ambulatory Visit (HOSPITAL_BASED_OUTPATIENT_CLINIC_OR_DEPARTMENT_OTHER): Payer: Self-pay

## 2023-12-31 ENCOUNTER — Ambulatory Visit: Admitting: Family Medicine

## 2023-12-31 ENCOUNTER — Encounter: Payer: Self-pay | Admitting: Family Medicine

## 2023-12-31 VITALS — BP 130/80 | HR 83 | Temp 97.1°F | Resp 16 | Ht 62.0 in | Wt 240.0 lb

## 2023-12-31 DIAGNOSIS — Z0000 Encounter for general adult medical examination without abnormal findings: Secondary | ICD-10-CM

## 2023-12-31 DIAGNOSIS — R5383 Other fatigue: Secondary | ICD-10-CM | POA: Diagnosis not present

## 2023-12-31 DIAGNOSIS — Z23 Encounter for immunization: Secondary | ICD-10-CM | POA: Diagnosis not present

## 2023-12-31 DIAGNOSIS — R0683 Snoring: Secondary | ICD-10-CM

## 2023-12-31 MED ORDER — SEMAGLUTIDE-WEIGHT MANAGEMENT 2.4 MG/0.75ML ~~LOC~~ SOAJ
2.4000 mg | SUBCUTANEOUS | 11 refills | Status: AC
  Filled 2023-12-31: qty 3, 28d supply, fill #0
  Filled 2024-02-27: qty 3, 28d supply, fill #1
  Filled 2024-04-02: qty 3, 28d supply, fill #2

## 2023-12-31 NOTE — Patient Instructions (Addendum)
Give [place] 2-3 business days to get the results of your labs back.   Keep the diet clean and stay active.  Please get me a copy of your advanced directive form at your convenience.   I recommend getting the flu shot in mid October. This suggestion would change if the CDC comes out with a different recommendation.   If you do not hear anything about your referral in the next 1-2 weeks, call our office and ask for an update.   Let us know if you need anything.

## 2023-12-31 NOTE — Progress Notes (Signed)
 Chief Complaint  Patient presents with   Annual Exam    CPE     Well Woman [name] [name] is here for a complete physical.   Her last physical was >1 year ago.  Current diet: in general, a healthy diet. Current exercise: walking, lifting wts. Fatigue out of ordinary? No Seatbelt? Yes Advanced directive? 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   MDD (major depressive disorder)    Migraine    Morbid obesity (HCC)    Numbness and tingling of left leg    cold sensation, painful     Past Surgical History:  Procedure Laterality Date   COLONOSCOPY  02/06/2018   by dr san   FISTULOTOMY N/A 06/28/2017   Procedure: FISTULOTOMY;  Surgeon: Debby Hila, MD;  Location: [facility] [place];  Service: General;  Laterality: N/A;   INCISION AND DRAINAGE PERIRECTAL ABSCESS Left 12/21/2016   Procedure: PLACEMENT OF SETON INTO FISTULA  AND INCISION AND DRAINAGE OF PERIRECTAL ABCESS;  Surgeon: Stevie Herlene Righter, MD;  Location: [facility];  Service: General;  Laterality: Left;   IRRIGATION AND DEBRIDEMENT BUTTOCKS Left 12/28/2015   Procedure: IRRIGATION AND DEBRIDEMENT BUTTOCKS ABSCESS CHRONIC;  Surgeon: Debby Shipper, MD;  Location: [facility];  Service: General;  Laterality: Left;   LAPAROSCOPIC BILATERAL SALPINGECTOMY Bilateral 03/09/2021   Procedure: LAPAROSCOPIC BILATERAL SALPINGECTOMY;  Surgeon: Lorence Ozell CROME, MD;  Location: [facility];  Service: Gynecology;  Laterality: Bilateral;   LIGATION OF INTERNAL FISTULA TRACT N/A 03/08/2017   Procedure: LIGATION OF INTERNAL INTERSPHINCTERIC FISTULA TRACT;  Surgeon: Stevie, Herlene Righter, MD;  Location: [facility];  Service: General;  Laterality: N/A;   TUBAL LIGATION      Medications  Current Outpatient Medications on File Prior to Visit  Medication Sig Dispense Refill   methocarbamol  (ROBAXIN ) 500 MG tablet Take 1 tablet (500 mg total) by mouth every 8 (eight) hours as needed for muscle spasms. 21 tablet 0   No current facility-administered medications on file prior to visit.     Allergies Allergies  Allergen Reactions   Gabapentin      hallunciations   Cinnamon Other (See Comments)    Blisters on mouth and lips, coughing.     Hydrocodone  Diarrhea    Review of Systems: Constitutional:  no unexpected weight changes Eye:  no recent significant change in vision Ear/Nose/Mouth/Throat:  Ears:  no tinnitus or vertigo and no recent change in hearing Nose/Mouth/Throat:  no complaints of nasal congestion, no sore throat Cardiovascular: no chest pain Respiratory:  no cough and no shortness of breath Gastrointestinal:  no abdominal pain, no change in bowel habits GU:  Female: negative for dysuria or pelvic pain Musculoskeletal/Extremities:  no pain of the joints Integumentary (Skin/Breast):  no abnormal skin lesions reported Neurologic:  no headaches Endocrine:  denies fatigue Hematologic/Lymphatic:  No areas of easy bleeding  Exam BP 130/80 (BP Location: Left  Arm, Patient Position: Sitting)   Pulse 83   Temp (!) 97.1 F (36.2 C) (Oral)   Resp 16   Ht 5' 2 (1.575 m)   Wt 240 lb (108.9 kg)   SpO2 99%   BMI 43.90 kg/m  General:  well developed, well nourished, in no apparent distress Skin:  no significant moles, warts, or growths Head:  no masses, lesions, or tenderness Eyes:  pupils equal and round, sclera anicteric without  injection Ears:  canals without lesions, TMs shiny without retraction, no obvious effusion, no erythema Nose:  nares patent, mucosa normal, and no drainage  Throat/Pharynx:  lips and gingiva without lesion; tongue and uvula midline; non-inflamed pharynx; no exudates or postnasal drainage Neck: neck supple without adenopathy, thyromegaly, or masses Lungs:  clear to auscultation, breath sounds equal bilaterally, no respiratory distress Cardio:  regular rate and rhythm, no bruits, no LE edema Abdomen:  abdomen soft, nontender; bowel sounds normal; no masses or organomegaly Genital: Defer to GYN Musculoskeletal:  symmetrical muscle groups noted without atrophy or deformity Extremities:  no clubbing, cyanosis, or edema, no deformities, no skin discoloration Neuro:  gait normal; deep tendon reflexes normal and symmetric Psych: well oriented with normal range of affect and appropriate judgment/insight  Assessment and Plan  Well adult exam - Plan: Hepatic function panel, CBC  Snoring - Plan: Ambulatory referral to Neurology  Fatigue, unspecified type - Plan: Ambulatory referral to Neurology   Well 38 y.o. female. Counseled on diet and exercise. Advanced directive form requested today.  Flu shot politely declined. Tdap today. Refer Neuro for OSA eval.  Other orders as above. Follow up in 6 mo. The patient voiced understanding and agreement to the plan.  Mabel Mt [place], DO 12/31/23 1:24 PM

## 2023-12-31 NOTE — Addendum Note (Signed)
 Addended by: Almas Rake M on: 12/31/2023 01:49 PM   Modules accepted: Orders

## 2023-12-31 NOTE — Telephone Encounter (Signed)
Letter done and given to pt

## 2024-01-01 ENCOUNTER — Other Ambulatory Visit: Payer: Self-pay

## 2024-01-01 ENCOUNTER — Ambulatory Visit: Payer: Self-pay | Admitting: Family Medicine

## 2024-01-01 DIAGNOSIS — D72829 Elevated white blood cell count, unspecified: Secondary | ICD-10-CM

## 2024-01-01 LAB — CBC
HCT: 45.4 % (ref 36.0–46.0)
Hemoglobin: 15.5 g/dL — ABNORMAL HIGH (ref 12.0–15.0)
MCHC: 34.1 g/dL (ref 30.0–36.0)
MCV: 86.1 fl (ref 78.0–100.0)
Platelets: 338 K/uL (ref 150.0–400.0)
RBC: 5.27 Mil/uL — ABNORMAL HIGH (ref 3.87–5.11)
RDW: 12.8 % (ref 11.5–15.5)
WBC: 10.3 K/uL (ref 4.0–10.5)

## 2024-01-01 LAB — HEPATIC FUNCTION PANEL
ALT: 16 U/L (ref 0–35)
AST: 18 U/L (ref 0–37)
Albumin: 4.4 g/dL (ref 3.5–5.2)
Alkaline Phosphatase: 34 U/L — ABNORMAL LOW (ref 39–117)
Bilirubin, Direct: 0.1 mg/dL (ref 0.0–0.3)
Total Bilirubin: 0.5 mg/dL (ref 0.2–1.2)
Total Protein: 7.4 g/dL (ref 6.0–8.3)

## 2024-01-23 ENCOUNTER — Institutional Professional Consult (permissible substitution): Admitting: Neurology

## 2024-02-04 ENCOUNTER — Institutional Professional Consult (permissible substitution): Admitting: Neurology

## 2024-02-04 ENCOUNTER — Encounter: Payer: Self-pay | Admitting: Neurology

## 2024-02-11 ENCOUNTER — Encounter: Payer: Self-pay | Admitting: Family Medicine

## 2024-02-11 ENCOUNTER — Ambulatory Visit: Admitting: Family Medicine

## 2024-02-11 ENCOUNTER — Ambulatory Visit: Payer: Self-pay | Admitting: Family Medicine

## 2024-02-11 VITALS — BP 132/82 | HR 68 | Temp 98.0°F | Resp 16 | Ht 62.0 in | Wt 232.2 lb

## 2024-02-11 DIAGNOSIS — M79661 Pain in right lower leg: Secondary | ICD-10-CM | POA: Diagnosis not present

## 2024-02-11 DIAGNOSIS — D582 Other hemoglobinopathies: Secondary | ICD-10-CM | POA: Diagnosis not present

## 2024-02-11 DIAGNOSIS — F418 Other specified anxiety disorders: Secondary | ICD-10-CM | POA: Diagnosis not present

## 2024-02-11 LAB — CBC
HCT: 43.1 % (ref 36.0–46.0)
Hemoglobin: 15 g/dL (ref 12.0–15.0)
MCHC: 34.9 g/dL (ref 30.0–36.0)
MCV: 85.9 fl (ref 78.0–100.0)
Platelets: 327 K/uL (ref 150.0–400.0)
RBC: 5.01 Mil/uL (ref 3.87–5.11)
RDW: 12.7 % (ref 11.5–15.5)
WBC: 7.8 K/uL (ref 4.0–10.5)

## 2024-02-11 MED ORDER — PROPRANOLOL HCL 20 MG PO TABS: 20.0000 mg | ORAL_TABLET | Freq: Three times a day (TID) | ORAL | 0 refills | Status: AC | PRN

## 2024-02-11 NOTE — Progress Notes (Signed)
 Chief Complaint  Patient presents with   Mass    Bump on Right Leg    [name] [name] is a 39 y.o. female here for a skin complaint.  Duration: 2 days Location: RLE Pruritic? No Painful? Yes Drainage? No Prolonged travel, bedrest, surgery? No Trauma? No Other associated symptoms: Slight bruising No fevers, shortness of breath, coughing, smoking, estrogen use, or personal/family history of clots. Therapies tried thus far: None  Patient is separating from her partner.  She is very stressed right now.  She has a history of anxiety but is not currently taking anything.  No homicidal or suicidal ideation.  No self-medication.  She is not seeing a therapist/counselor.  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 No accessory muscle use Skin: There is TTP over the medial proximal calf region with slight overlying ecchymosis. No drainage, erythema, excessive warmth, fluctuance, excoriation Psych: Age appropriate judgment and insight  Right calf pain - Plan: D-Dimer, Quantitative  Situational anxiety - Plan: propranolol (INDERAL) 20 MG  tablet  Elevated hemoglobin - Plan: CBC, Erythropoietin  Probably musculoskeletal.  Will check a D-dimer.  She has very few risk factors, fortunately.  Rehab provided in paperwork.  Heat, ice, Tylenol .  She will not do these until the lab work is back. Exacerbation of chronic issue.  Add propranolol 10-40 mg 3 times daily as needed.  She moves out at the end of the week intradermal closure.  She will keep me in the loop with how things are going in the next week or 2. Follow-up on elevated hemoglobin levels. F/u as originally scheduled. The patient voiced understanding and agreement to the plan.  Mabel Mt [place], DO 02/11/24 8:16 AM

## 2024-02-11 NOTE — Patient Instructions (Signed)
 Give us  2-3 business days to get the results of your labs back.   Keep the diet clean and stay active.  Heat (pad or rice pillow in microwave) over affected area, 10-15 minutes twice daily.   Ice/cold pack over area for 10-15 min twice daily.  OK to take Tylenol  1000 mg (2 extra strength tabs) or 975 mg (3 regular strength tabs) every 6 hours as needed.  Let us  know if you need anything.  Stretching and range of motion exercises These exercises warm up your muscles and joints and improve the movement and flexibility of your lower leg. These exercises also help to relieve pain and stiffness.  Exercise A: Gastrocnemius stretch Sit with your left / right leg extended. Loop a belt or towel around the ball of your left / right foot. The ball of your foot is on the walking surface, right under your toes. Hold both ends of the belt or towel. Keep your left / right ankle and foot relaxed and keep your knee straight while you use the belt or towel to pull your foot and ankle toward you. Stop at the first point of resistance. Hold this position for 30 seconds. Repeat 2 times. Complete this exercise 3 times per week.  Exercise B: Ankle alphabet Sit with your left / right leg supported at the lower leg. Do not rest your foot on anything. Make sure your foot has room to move freely. Think of your left / right foot as a paintbrush, and move your foot to trace each letter of the alphabet in the air. Keep your hip and knee still while you trace. Trace every letter from A to Z. Repeat 2 times. Complete this exercise 3 times per week.  Strengthening exercises These exercises build strength and endurance in your lower leg. Endurance is the ability to use your muscles for a long time, even after they get tired.  Exercise C: Plantar flexors with band Sit with your left / right leg extended. Loop a rubber exercise band or tube around the ball of your left / right foot. The ball of your foot is on the  walking surface, right under your toes. While holding both ends of the band or tube, slowly point your toes downward, pushing them away from you. Hold this position for 3 seconds. Slowly return your foot to the starting position and repeat for a total of 10 repetitions. Repeat 2 times. Complete this exercise 3 times per week.  Exercise D: Plantar flexors, standing Stand with your feet shoulder-width apart. Place your hands on a wall or table to steady yourself as needed, but try not to use it very much for support. Rise up on your toes. If this exercise is too easy, try these options: Shift your weight toward your left / right leg until you feel challenged. If told by your health care provider, stand on your left / right foot only. Hold this position for 3 seconds. Repeat for a total of 10 repetitions. Repeat 2 times. Complete this exercise 3 times per week.  Exercise E: Plantar flexors, eccentric Stand on the balls of your feet on the edge of a step. The ball of your foot is on the walking surface, right under your toes. Place your hands on a wall or railing for balance as needed, but try not to lean on it for support. Rise up on your toes, using both legs to help. Slowly shift all of your weight to your left / right foot  and lift your other foot off the step. Slowly lower your left / right heel so it drops below the level of the step. You will feel a slight stretch in your left / right calf. Put your other foot back onto the step. Repeat 2 times. Complete this exercise 3 times per week. This information is not intended to replace advice given to you by your health care provider. Make sure you discuss any questions you have with your health care provider.

## 2024-02-13 LAB — D-DIMER, QUANTITATIVE: D-Dimer, Quant: <0.19 ug{FEU}/mL (ref <0.50)

## 2024-02-13 LAB — ERYTHROPOIETIN: Erythropoietin: 5.3 m[IU]/mL (ref 2.6–18.5)
